# Patient Record
Sex: Male | Born: 1935
Health system: Southern US, Community
[De-identification: ages and names within clinical notes are randomized; demographics above are authoritative.]

## PROBLEM LIST (undated history)

## (undated) DIAGNOSIS — Z972 Presence of dental prosthetic device (complete) (partial): Secondary | ICD-10-CM

## (undated) DIAGNOSIS — I509 Heart failure, unspecified: Secondary | ICD-10-CM

## (undated) DIAGNOSIS — N390 Urinary tract infection, site not specified: Secondary | ICD-10-CM

## (undated) DIAGNOSIS — R413 Other amnesia: Secondary | ICD-10-CM

## (undated) DIAGNOSIS — C679 Malignant neoplasm of bladder, unspecified: Secondary | ICD-10-CM

## (undated) DIAGNOSIS — I639 Cerebral infarction, unspecified: Secondary | ICD-10-CM

## (undated) DIAGNOSIS — Z8489 Family history of other specified conditions: Secondary | ICD-10-CM

## (undated) DIAGNOSIS — E119 Type 2 diabetes mellitus without complications: Secondary | ICD-10-CM

## (undated) DIAGNOSIS — F32A Depression, unspecified: Secondary | ICD-10-CM

## (undated) DIAGNOSIS — K08109 Complete loss of teeth, unspecified cause, unspecified class: Secondary | ICD-10-CM

## (undated) DIAGNOSIS — E785 Hyperlipidemia, unspecified: Secondary | ICD-10-CM

## (undated) DIAGNOSIS — I739 Peripheral vascular disease, unspecified: Secondary | ICD-10-CM

## (undated) DIAGNOSIS — K219 Gastro-esophageal reflux disease without esophagitis: Secondary | ICD-10-CM

## (undated) DIAGNOSIS — M1990 Unspecified osteoarthritis, unspecified site: Secondary | ICD-10-CM

## (undated) DIAGNOSIS — N189 Chronic kidney disease, unspecified: Secondary | ICD-10-CM

## (undated) DIAGNOSIS — F329 Major depressive disorder, single episode, unspecified: Secondary | ICD-10-CM

## (undated) DIAGNOSIS — H9190 Unspecified hearing loss, unspecified ear: Secondary | ICD-10-CM

## (undated) DIAGNOSIS — F419 Anxiety disorder, unspecified: Secondary | ICD-10-CM

## (undated) DIAGNOSIS — I10 Essential (primary) hypertension: Secondary | ICD-10-CM

## (undated) HISTORY — DX: Cerebral infarction, unspecified: I63.9

## (undated) HISTORY — PX: HERNIA REPAIR: SHX51

## (undated) HISTORY — PX: COLONOSCOPY: SHX174

## (undated) HISTORY — PX: MULTIPLE TOOTH EXTRACTIONS: SHX2053

## (undated) HISTORY — PX: CATARACT EXTRACTION: SUR2

## (undated) HISTORY — PX: TONSILLECTOMY: SUR1361

## (undated) HISTORY — PX: CARPAL TUNNEL RELEASE: SHX101

## (undated) HISTORY — PX: CHOLECYSTECTOMY: SHX55

## (undated) HISTORY — PX: APPENDECTOMY: SHX54

---
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
## 1999-02-12 ENCOUNTER — Ambulatory Visit (HOSPITAL_COMMUNITY): Admission: RE | Admit: 1999-02-12 | Discharge: 1999-02-12 | Payer: Self-pay | Admitting: Family Medicine

## 1999-02-12 ENCOUNTER — Encounter: Payer: Self-pay | Admitting: Family Medicine

## 1999-03-26 ENCOUNTER — Ambulatory Visit (HOSPITAL_BASED_OUTPATIENT_CLINIC_OR_DEPARTMENT_OTHER): Admission: RE | Admit: 1999-03-26 | Discharge: 1999-03-26 | Payer: Self-pay | Admitting: Plastic Surgery

## 1999-06-28 ENCOUNTER — Encounter: Payer: Self-pay | Admitting: Family Medicine

## 1999-06-28 ENCOUNTER — Ambulatory Visit (HOSPITAL_COMMUNITY): Admission: RE | Admit: 1999-06-28 | Discharge: 1999-06-28 | Payer: Self-pay | Admitting: Family Medicine

## 2000-11-18 ENCOUNTER — Encounter: Admission: RE | Admit: 2000-11-18 | Discharge: 2001-02-16 | Payer: Self-pay | Admitting: Endocrinology

## 2007-10-13 ENCOUNTER — Ambulatory Visit: Admission: RE | Admit: 2007-10-13 | Discharge: 2007-10-13 | Payer: Self-pay | Admitting: Family Medicine

## 2007-10-13 ENCOUNTER — Encounter (INDEPENDENT_AMBULATORY_CARE_PROVIDER_SITE_OTHER): Payer: Self-pay | Admitting: Family Medicine

## 2007-10-13 ENCOUNTER — Ambulatory Visit: Payer: Self-pay | Admitting: Vascular Surgery

## 2010-07-05 ENCOUNTER — Ambulatory Visit (HOSPITAL_COMMUNITY): Admission: RE | Admit: 2010-07-05 | Discharge: 2010-07-05 | Payer: Self-pay | Admitting: Urology

## 2010-07-30 ENCOUNTER — Emergency Department (HOSPITAL_COMMUNITY): Admission: EM | Admit: 2010-07-30 | Discharge: 2010-07-30 | Payer: Self-pay | Admitting: Emergency Medicine

## 2010-08-23 ENCOUNTER — Ambulatory Visit (HOSPITAL_COMMUNITY): Admission: RE | Admit: 2010-08-23 | Discharge: 2010-08-23 | Payer: Self-pay | Admitting: Urology

## 2011-03-06 LAB — GLUCOSE, CAPILLARY
Glucose-Capillary: 142 mg/dL — ABNORMAL HIGH (ref 70–99)
Glucose-Capillary: 170 mg/dL — ABNORMAL HIGH (ref 70–99)
Glucose-Capillary: 206 mg/dL — ABNORMAL HIGH (ref 70–99)

## 2011-03-07 LAB — COMPREHENSIVE METABOLIC PANEL
ALT: 39 U/L (ref 0–53)
ALT: 33 U/L (ref 0–53)
AST: 53 U/L — ABNORMAL HIGH (ref 0–37)
AST: 41 U/L — ABNORMAL HIGH (ref 0–37)
Albumin: 3.7 g/dL (ref 3.5–5.2)
Albumin: 3.7 g/dL (ref 3.5–5.2)
Alkaline Phosphatase: 69 U/L (ref 39–117)
Alkaline Phosphatase: 66 U/L (ref 39–117)
BUN: 22 mg/dL (ref 6–23)
BUN: 23 mg/dL (ref 6–23)
CO2: 24 mEq/L (ref 19–32)
CO2: 26 mEq/L (ref 19–32)
Calcium: 9.2 mg/dL (ref 8.4–10.5)
Calcium: 10.1 mg/dL (ref 8.4–10.5)
Chloride: 107 mEq/L (ref 96–112)
Chloride: 105 mEq/L (ref 96–112)
Creatinine, Ser: 1.23 mg/dL (ref 0.4–1.5)
Creatinine, Ser: 1.24 mg/dL (ref 0.4–1.5)
GFR calc Af Amer: >60 mL/min (ref >60)
GFR calc Af Amer: >60 mL/min (ref >60)
GFR calc non Af Amer: 56 mL/min — ABNORMAL LOW (ref >60)
GFR calc non Af Amer: 56 mL/min — ABNORMAL LOW (ref >60)
Glucose, Bld: 271 mg/dL — ABNORMAL HIGH (ref 70–99)
Glucose, Bld: 172 mg/dL — ABNORMAL HIGH (ref 70–99)
Potassium: 4.2 mEq/L (ref 3.5–5.1)
Potassium: 4 mEq/L (ref 3.5–5.1)
Sodium: 141 mEq/L (ref 135–145)
Sodium: 139 mEq/L (ref 135–145)
Total Bilirubin: 0.7 mg/dL (ref 0.3–1.2)
Total Bilirubin: 0.6 mg/dL (ref 0.3–1.2)
Total Protein: 7 g/dL (ref 6.0–8.3)
Total Protein: 6.6 g/dL (ref 6.0–8.3)

## 2011-03-07 LAB — CBC
HCT: 39 % (ref 39.0–52.0)
HCT: 38.7 % — ABNORMAL LOW (ref 39.0–52.0)
Hemoglobin: 13.2 g/dL (ref 13.0–17.0)
Hemoglobin: 13.3 g/dL (ref 13.0–17.0)
MCH: 31 pg (ref 26.0–34.0)
MCH: 31.1 pg (ref 26.0–34.0)
MCHC: 33.8 g/dL (ref 30.0–36.0)
MCHC: 34.3 g/dL (ref 30.0–36.0)
MCV: 91.8 fL (ref 78.0–100.0)
MCV: 90.7 fL (ref 78.0–100.0)
Platelets: 180 10*3/uL (ref 150–400)
Platelets: 202 10*3/uL (ref 150–400)
RBC: 4.25 MIL/uL (ref 4.22–5.81)
RBC: 4.27 MIL/uL (ref 4.22–5.81)
RDW: 14 % (ref 11.5–15.5)
RDW: 14.1 % (ref 11.5–15.5)
WBC: 5.4 10*3/uL (ref 4.0–10.5)
WBC: 5.9 10*3/uL (ref 4.0–10.5)

## 2011-03-07 LAB — SURGICAL PCR SCREEN
MRSA, PCR: NEGATIVE
Staphylococcus aureus: NEGATIVE

## 2011-03-08 LAB — GLUCOSE, CAPILLARY
Glucose-Capillary: 124 mg/dL — ABNORMAL HIGH (ref 70–99)
Glucose-Capillary: 163 mg/dL — ABNORMAL HIGH (ref 70–99)

## 2011-03-09 LAB — SURGICAL PCR SCREEN
MRSA, PCR: NEGATIVE
Staphylococcus aureus: NEGATIVE

## 2011-03-09 LAB — COMPREHENSIVE METABOLIC PANEL
ALT: 39 U/L (ref 0–53)
AST: 46 U/L — ABNORMAL HIGH (ref 0–37)
Albumin: 4.1 g/dL (ref 3.5–5.2)
Alkaline Phosphatase: 62 U/L (ref 39–117)
BUN: 23 mg/dL (ref 6–23)
CO2: 28 mEq/L (ref 19–32)
Calcium: 10 mg/dL (ref 8.4–10.5)
Chloride: 104 mEq/L (ref 96–112)
Creatinine, Ser: 1.18 mg/dL (ref 0.4–1.5)
GFR calc Af Amer: >60 mL/min (ref >60)
GFR calc non Af Amer: 59 mL/min — ABNORMAL LOW (ref >60)
Glucose, Bld: 132 mg/dL — ABNORMAL HIGH (ref 70–99)
Potassium: 4.3 mEq/L (ref 3.5–5.1)
Sodium: 140 mEq/L (ref 135–145)
Total Bilirubin: 0.9 mg/dL (ref 0.3–1.2)
Total Protein: 7.2 g/dL (ref 6.0–8.3)

## 2011-03-09 LAB — CBC
HCT: 41.7 % (ref 39.0–52.0)
Hemoglobin: 14.4 g/dL (ref 13.0–17.0)
MCH: 31.6 pg (ref 26.0–34.0)
MCHC: 34.5 g/dL (ref 30.0–36.0)
MCV: 91.4 fL (ref 78.0–100.0)
Platelets: 215 10*3/uL (ref 150–400)
RBC: 4.57 MIL/uL (ref 4.22–5.81)
RDW: 14 % (ref 11.5–15.5)
WBC: 5.9 10*3/uL (ref 4.0–10.5)

## 2015-05-26 ENCOUNTER — Emergency Department (HOSPITAL_COMMUNITY): Payer: Medicare Other

## 2015-05-26 ENCOUNTER — Encounter (HOSPITAL_COMMUNITY): Payer: Self-pay | Admitting: Emergency Medicine

## 2015-05-26 ENCOUNTER — Inpatient Hospital Stay (HOSPITAL_COMMUNITY)
Admission: EM | Admit: 2015-05-26 | Discharge: 2015-05-28 | DRG: 065 | Disposition: A | Discharge status: Home Health | Payer: Medicare Other | Attending: Internal Medicine | Admitting: Internal Medicine

## 2015-05-26 DIAGNOSIS — Z8551 Personal history of malignant neoplasm of bladder: Secondary | ICD-10-CM

## 2015-05-26 DIAGNOSIS — E785 Hyperlipidemia, unspecified: Secondary | ICD-10-CM | POA: Diagnosis not present

## 2015-05-26 DIAGNOSIS — Z87891 Personal history of nicotine dependence: Secondary | ICD-10-CM

## 2015-05-26 DIAGNOSIS — N179 Acute kidney failure, unspecified: Secondary | ICD-10-CM | POA: Diagnosis not present

## 2015-05-26 DIAGNOSIS — E119 Type 2 diabetes mellitus without complications: Secondary | ICD-10-CM

## 2015-05-26 DIAGNOSIS — Z8249 Family history of ischemic heart disease and other diseases of the circulatory system: Secondary | ICD-10-CM

## 2015-05-26 DIAGNOSIS — I639 Cerebral infarction, unspecified: Principal | ICD-10-CM | POA: Diagnosis present

## 2015-05-26 DIAGNOSIS — Z823 Family history of stroke: Secondary | ICD-10-CM | POA: Diagnosis not present

## 2015-05-26 DIAGNOSIS — I10 Essential (primary) hypertension: Secondary | ICD-10-CM | POA: Diagnosis not present

## 2015-05-26 DIAGNOSIS — L97909 Non-pressure chronic ulcer of unspecified part of unspecified lower leg with unspecified severity: Secondary | ICD-10-CM | POA: Diagnosis not present

## 2015-05-26 DIAGNOSIS — E1159 Type 2 diabetes mellitus with other circulatory complications: Secondary | ICD-10-CM | POA: Diagnosis not present

## 2015-05-26 DIAGNOSIS — R531 Weakness: Secondary | ICD-10-CM | POA: Diagnosis not present

## 2015-05-26 DIAGNOSIS — E118 Type 2 diabetes mellitus with unspecified complications: Secondary | ICD-10-CM | POA: Diagnosis not present

## 2015-05-26 DIAGNOSIS — I351 Nonrheumatic aortic (valve) insufficiency: Secondary | ICD-10-CM | POA: Diagnosis not present

## 2015-05-26 DIAGNOSIS — I6789 Other cerebrovascular disease: Secondary | ICD-10-CM | POA: Diagnosis not present

## 2015-05-26 DIAGNOSIS — I63412 Cerebral infarction due to embolism of left middle cerebral artery: Secondary | ICD-10-CM | POA: Diagnosis not present

## 2015-05-26 HISTORY — DX: Essential (primary) hypertension: I10

## 2015-05-26 HISTORY — DX: Type 2 diabetes mellitus without complications: E11.9

## 2015-05-26 HISTORY — DX: Urinary tract infection, site not specified: N39.0

## 2015-05-26 HISTORY — DX: Malignant neoplasm of bladder, unspecified: C67.9

## 2015-05-26 HISTORY — DX: Hyperlipidemia, unspecified: E78.5

## 2015-05-26 LAB — COMPREHENSIVE METABOLIC PANEL
ALT: 30 U/L (ref 17–63)
AST: 39 U/L (ref 15–41)
Albumin: 3.5 g/dL (ref 3.5–5.0)
Alkaline Phosphatase: 63 U/L (ref 38–126)
Anion gap: 14 (ref 5–15)
BUN: 28 mg/dL — ABNORMAL HIGH (ref 6–20)
CO2: 25 mmol/L (ref 22–32)
Calcium: 9.5 mg/dL (ref 8.9–10.3)
Chloride: 100 mmol/L — ABNORMAL LOW (ref 101–111)
Creatinine, Ser: 1.66 mg/dL — ABNORMAL HIGH (ref 0.61–1.24)
GFR calc Af Amer: 42 mL/min — ABNORMAL LOW (ref >60)
GFR calc non Af Amer: 36 mL/min — ABNORMAL LOW (ref >60)
Glucose, Bld: 258 mg/dL — ABNORMAL HIGH (ref 65–99)
Potassium: 4.1 mmol/L (ref 3.5–5.1)
Sodium: 139 mmol/L (ref 135–145)
Total Bilirubin: 0.5 mg/dL (ref 0.3–1.2)
Total Protein: 6.2 g/dL — ABNORMAL LOW (ref 6.5–8.1)

## 2015-05-26 LAB — DIFFERENTIAL
Basophils Absolute: 0 10*3/uL (ref 0.0–0.1)
Basophils Relative: 0 % (ref 0–1)
Eosinophils Absolute: 0.3 10*3/uL (ref 0.0–0.7)
Eosinophils Relative: 4 % (ref 0–5)
Lymphocytes Relative: 24 % (ref 12–46)
Lymphs Abs: 1.7 10*3/uL (ref 0.7–4.0)
Monocytes Absolute: 0.8 10*3/uL (ref 0.1–1.0)
Monocytes Relative: 11 % (ref 3–12)
Neutro Abs: 4.4 10*3/uL (ref 1.7–7.7)
Neutrophils Relative %: 61 % (ref 43–77)

## 2015-05-26 LAB — ETHANOL: Alcohol, Ethyl (B): <5 mg/dL (ref <5)

## 2015-05-26 LAB — CBC
HCT: 39.2 % (ref 39.0–52.0)
Hemoglobin: 13.3 g/dL (ref 13.0–17.0)
MCH: 30.6 pg (ref 26.0–34.0)
MCHC: 33.9 g/dL (ref 30.0–36.0)
MCV: 90.3 fL (ref 78.0–100.0)
Platelets: 186 10*3/uL (ref 150–400)
RBC: 4.34 MIL/uL (ref 4.22–5.81)
RDW: 13.3 % (ref 11.5–15.5)
WBC: 7.2 10*3/uL (ref 4.0–10.5)

## 2015-05-26 LAB — I-STAT CHEM 8, ED
BUN: 31 mg/dL — ABNORMAL HIGH (ref 6–20)
Calcium, Ion: 1.18 mmol/L (ref 1.13–1.30)
Chloride: 102 mmol/L (ref 101–111)
Creatinine, Ser: 1.6 mg/dL — ABNORMAL HIGH (ref 0.61–1.24)
Glucose, Bld: 264 mg/dL — ABNORMAL HIGH (ref 65–99)
HCT: 40 % (ref 39.0–52.0)
Hemoglobin: 13.6 g/dL (ref 13.0–17.0)
Potassium: 4.2 mmol/L (ref 3.5–5.1)
Sodium: 139 mmol/L (ref 135–145)
TCO2: 24 mmol/L (ref 0–100)

## 2015-05-26 LAB — PROTIME-INR
INR: 1.14 (ref 0.00–1.49)
Prothrombin Time: 14.8 seconds (ref 11.6–15.2)

## 2015-05-26 LAB — I-STAT TROPONIN, ED: Troponin i, poc: 0.01 ng/mL (ref 0.00–0.08)

## 2015-05-26 LAB — APTT: aPTT: 27 seconds (ref 24–37)

## 2015-05-26 NOTE — ED Notes (Signed)
Per ems, pt was outside washing with pressure washer, felt like his right arm and leg felt "funny". Fine motor skills not working well. Pt also c/o slurred speech.

## 2015-05-26 NOTE — ED Notes (Signed)
Airway cleared by Dr. Wyvonnia Dusky. Pt transported to CT 3 by this RN

## 2015-05-26 NOTE — ED Notes (Signed)
Pt returned back from CT.

## 2015-05-26 NOTE — H&P (Addendum)
Henry Wiggins is an 81 y.o. male.   Dr. Rockwell Germany (pcp)  Chief Complaint: right hand weakness HPI: 81 yo male with dm2, htn, hyperlipidemia,  Was out powerwashing and about 3:30 walking up on porch, had a strange feeling in the left leg.  It was not responding to what he wanted it to do.  The sensation lasted for about 10 minutes.  Pt wife went to get aspirin.  And then he ate dinner. And then went to turn television on to watch [employer].  Pt had difficulty speaking, didn't slurr words but difficult to get out.  Went to pick up a glass but hand would not work right.  Pt 's family call ambulance and pt came to ED for evaluation.  CT brain showed bilateral lacunar infarcts new from 2011.  EKG showed nsr at 80.  Pt notes that bp has been high for ? Years.  ED consulted neurology for code stroke.  Pt will be admitted for cva.   Past Medical History  Diagnosis Date  . Diabetes   . Hypertension   . Hyperlipidemia   . UTI (lower urinary tract infection)   . Bladder cancer     Dr. Janice Norrie    Past Surgical History  Procedure Laterality Date  . Cholecystectomy    . Hernia repair      x4  . Carpal tunnel release Right   . Appendectomy    . Cataract extraction Bilateral   . Tonsillectomy      Family History  Problem Relation Age of Onset  . Hypertension Mother    Social History:  reports that he quit smoking about 61 years ago. His smoking use included Cigarettes. He has a 2.5 pack-year smoking history. He does not have any smokeless tobacco history on file. He reports that he does not drink alcohol. His drug history is not on file.  Allergies: No Known Allergies Medications reviewed  Results for orders placed or performed during the hospital encounter of 05/26/15 (from the past 48 hour(s))  Ethanol     Status: None   Collection Time: 05/26/15 10:44 PM  Result Value Ref Range   Alcohol, Ethyl (B) <5 <5 mg/dL    Comment:        LOWEST DETECTABLE LIMIT FOR SERUM ALCOHOL IS 11 mg/dL FOR  MEDICAL PURPOSES ONLY   Protime-INR     Status: None   Collection Time: 05/26/15 10:44 PM  Result Value Ref Range   Prothrombin Time 14.8 11.6 - 15.2 seconds   INR 1.14 0.00 - 1.49  APTT     Status: None   Collection Time: 05/26/15 10:44 PM  Result Value Ref Range   aPTT 27 24 - 37 seconds  CBC     Status: None   Collection Time: 05/26/15 10:44 PM  Result Value Ref Range   WBC 7.2 4.0 - 10.5 K/uL   RBC 4.34 4.22 - 5.81 MIL/uL   Hemoglobin 13.3 13.0 - 17.0 g/dL   HCT 39.2 39.0 - 52.0 %   MCV 90.3 78.0 - 100.0 fL   MCH 30.6 26.0 - 34.0 pg   MCHC 33.9 30.0 - 36.0 g/dL   RDW 13.3 11.5 - 15.5 %   Platelets 186 150 - 400 K/uL  Differential     Status: None   Collection Time: 05/26/15 10:44 PM  Result Value Ref Range   Neutrophils Relative % 61 43 - 77 %   Neutro Abs 4.4 1.7 - 7.7 K/uL   Lymphocytes  Relative 24 12 - 46 %   Lymphs Abs 1.7 0.7 - 4.0 K/uL   Monocytes Relative 11 3 - 12 %   Monocytes Absolute 0.8 0.1 - 1.0 K/uL   Eosinophils Relative 4 0 - 5 %   Eosinophils Absolute 0.3 0.0 - 0.7 K/uL   Basophils Relative 0 0 - 1 %   Basophils Absolute 0.0 0.0 - 0.1 K/uL  Comprehensive metabolic panel     Status: Abnormal   Collection Time: 05/26/15 10:44 PM  Result Value Ref Range   Sodium 139 135 - 145 mmol/L   Potassium 4.1 3.5 - 5.1 mmol/L   Chloride 100 (L) 101 - 111 mmol/L   CO2 25 22 - 32 mmol/L   Glucose, Bld 258 (H) 65 - 99 mg/dL   BUN 28 (H) 6 - 20 mg/dL   Creatinine, Ser 1.66 (H) 0.61 - 1.24 mg/dL   Calcium 9.5 8.9 - 10.3 mg/dL   Total Protein 6.2 (L) 6.5 - 8.1 g/dL   Albumin 3.5 3.5 - 5.0 g/dL   AST 39 15 - 41 U/L   ALT 30 17 - 63 U/L   Alkaline Phosphatase 63 38 - 126 U/L   Total Bilirubin 0.5 0.3 - 1.2 mg/dL   GFR calc non Af Amer 36 (L) >60 mL/min   GFR calc Af Amer 42 (L) >60 mL/min    Comment: (NOTE) The eGFR has been calculated using the CKD EPI equation. This calculation has not been validated in all clinical situations. eGFR's persistently <60 mL/min  signify possible Chronic Kidney Disease.    Anion gap 14 5 - 15  I-stat troponin, ED (not at [facility], [facility])     Status: None   Collection Time: 05/26/15 11:00 PM  Result Value Ref Range   Troponin i, poc 0.01 0.00 - 0.08 ng/mL   Comment 3            Comment: Due to the release kinetics of cTnI, a negative result within the first hours of the onset of symptoms does not rule out myocardial infarction with certainty. If myocardial infarction is still suspected, repeat the test at appropriate intervals.   I-Stat Chem 8, ED  (not at [facility], [facility])     Status: Abnormal   Collection Time: 05/26/15 11:01 PM  Result Value Ref Range   Sodium 139 135 - 145 mmol/L   Potassium 4.2 3.5 - 5.1 mmol/L   Chloride 102 101 - 111 mmol/L   BUN 31 (H) 6 - 20 mg/dL   Creatinine, Ser 1.60 (H) 0.61 - 1.24 mg/dL   Glucose, Bld 264 (H) 65 - 99 mg/dL   Calcium, Ion 1.18 1.13 - 1.30 mmol/L   TCO2 24 0 - 100 mmol/L   Hemoglobin 13.6 13.0 - 17.0 g/dL   HCT 40.0 39.0 - 52.0 %   Ct Head Wo Contrast  05/26/2015   CLINICAL DATA:  Code stroke. Right upper extremity weakness and slurred speech. Initial encounter.  EXAM: CT HEAD WITHOUT CONTRAST  TECHNIQUE: Contiguous axial images were obtained from the base of the skull through the vertex without intravenous contrast.  COMPARISON:  MRI of the brain performed 10/12/2007, and CT of the head performed 07/30/2010  FINDINGS: There is no evidence of acute infarction, mass lesion, or intra- or extra-axial hemorrhage on CT.  Prominence of the ventricles and sulci reflects mild to moderate cortical volume loss. Cerebellar atrophy is noted. Diffuse periventricular and subcortical white matter change likely reflects small vessel ischemic microangiopathy. Mild chronic  ischemic change is noted at the basal ganglia bilaterally. Scattered chronic lacunar infarcts are noted at the corona radiata bilaterally.  The brainstem and fourth ventricle are within normal limits. The cerebral hemispheres  demonstrate grossly normal gray-white differentiation. No mass effect or midline shift is seen.  There is no evidence of fracture; visualized osseous structures are unremarkable in appearance. The orbits are within normal limits. The paranasal sinuses and mastoid air cells are well-aerated. No significant soft tissue abnormalities are seen.  IMPRESSION: 1. No acute intracranial pathology seen on CT. 2. Mild to moderate cortical volume loss and diffuse small vessel ischemic microangiopathy. 3. Scattered chronic lacunar infarcts at the corona radiata bilaterally, and chronic ischemic change at the basal ganglia.  These results were called by telephone at the time of interpretation on 05/26/2015 at 11:01 pm to Dr. Leonel Ramsay, who verbally acknowledged these results.   Electronically Signed   By: Garald Balding M.D.   On: 05/26/2015 23:01    Review of Systems  Constitutional: Negative.   HENT: Negative.   Eyes: Negative.   Respiratory: Negative.   Cardiovascular: Negative.   Gastrointestinal: Negative.   Genitourinary: Negative.   Musculoskeletal: Negative.   Skin: Negative.   Neurological: Positive for focal weakness. Negative for dizziness, tingling, tremors, sensory change and speech change.  Endo/Heme/Allergies: Negative.   Psychiatric/Behavioral: Negative.     Blood pressure 171/75, pulse 78, temperature 97.9 F (36.6 C), temperature source Oral, resp. rate 21, SpO2 94 %. Physical Exam  Constitutional: He is oriented to person, place, and time. He appears well-developed and well-nourished.  HENT:  Head: Normocephalic and atraumatic.  Eyes: Conjunctivae and EOM are normal. Pupils are equal, round, and reactive to light. No scleral icterus.  Neck: Normal range of motion. Neck supple. No JVD present. No tracheal deviation present. No thyromegaly present.  Cardiovascular: Normal rate and regular rhythm.  Exam reveals no gallop and no friction rub.   No murmur heard. Respiratory: Effort normal  and breath sounds normal. No respiratory distress. He has no wheezes. He has no rales.  GI: Soft. Bowel sounds are normal. He exhibits no distension. There is no tenderness. There is no rebound and no guarding.  Musculoskeletal: Normal range of motion. He exhibits no edema or tenderness.  Lymphadenopathy:    He has no cervical adenopathy.  Neurological: He is alert and oriented to person, place, and time. He has normal reflexes. He displays normal reflexes. No cranial nerve deficit. He exhibits normal muscle tone. Coordination normal.  Skin: Skin is warm and dry. No rash noted. No erythema. No pallor.  Psychiatric: He has a normal mood and affect. His behavior is normal. Judgment and thought content normal.     Assessment/Plan CVA Check MRI brain Check carotid ultrasound Check echo  Check lipid, hga1c, tsh, b12, esr Start on aspirin,  Pt has had adverse reaction to statins in the past=> confusion, (zocor)  ARF Check urine sodium , urine creatinine, urineeosinphils Renal ultrasound Hydrate with normal saline.  Stop losartan/hydrochlorothiazide  Dm2 fsbs ac and qhs, iss Stop metformin due to ARF  Hypertension; Hydralazine 65m iv q6h prn sbp >160  DVT scd,   KJani Gravel6/03/2015, 11:47 PM

## 2015-05-26 NOTE — ED Provider Notes (Signed)
CSN: [id number]     Arrival date & time 05/26/15  2231 History   First MD Initiated Contact with Patient 05/26/15 2236     Chief Complaint  Patient presents with  . Code Stroke     (Consider location/radiation/quality/duration/timing/severity/associated sxs/prior Treatment) HPI Comments: Patient from home with slurred speech, difficulty using right arm and heaviness that onset about one hour ago. 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 He denies any soreness in the arm. He says he's had difficulty gripping a cup and difficulty using the arm. He also try to call his wife and his speech was slurred. Denies any headache, chest pain, abdominal pain, back pain, nausea or vomiting. Code stroke was not activated by EMS as symptoms were head reportedly resolved. On my exam patient has some dysarthria and weakness in his right arm with past pointing and code stroke was activated.  The history is provided by the EMS personnel and the patient.    Past Medical History  Diagnosis Date  . Diabetes   . Hypertension   . Hyperlipidemia   . UTI (lower urinary tract infection)   . Bladder cancer     Dr. Janice Norrie   Past Surgical History  Procedure Laterality Date  . Cholecystectomy    . Hernia repair      x4  . Carpal tunnel release Right   . Appendectomy    . Cataract extraction Bilateral   . Tonsillectomy     Family History  Problem Relation Age of Onset  . Hypertension Mother    History  Substance Use Topics  . Smoking status: Former Smoker -- 0.50 packs/day for 5 years    Types: Cigarettes    Quit date: 12/22/1953  . Smokeless tobacco: Not on file  . Alcohol Use: No    Review of Systems  Constitutional: Negative for activity change and appetite change.  HENT: Negative for hearing loss.   Respiratory: Negative for cough and shortness of breath.   Cardiovascular: Negative for chest pain.  Gastrointestinal: Negative for nausea, vomiting and abdominal pain.   Genitourinary: Negative for dysuria, urgency and hematuria.  Musculoskeletal: Negative for myalgias and arthralgias.  Skin: Negative for rash.  Neurological: Positive for speech difficulty, weakness and numbness. Negative for dizziness, facial asymmetry and headaches.  A complete 10 system review of systems was obtained and all systems are negative except as noted in the HPI and PMH.      Allergies  Review of patient's allergies indicates no known allergies.  Home Medications   Prior to Admission medications   Medication Sig Start Date End Date Taking? Authorizing Provider  aspirin 325 MG tablet Take 325 mg by mouth once.   Yes Historical Provider, MD  Cholecalciferol 1000 UNITS tablet Take 1,000 Units by mouth daily.   Yes Historical Provider, MD  losartan-hydrochlorothiazide (HYZAAR) 100-25 MG per tablet Take 1 tablet by mouth daily. 04/26/15  Yes Historical Provider, MD  metFORMIN (GLUCOPHAGE) 1000 MG tablet Take 1 tablet by mouth 2 (two) times daily. 04/26/15  Yes Historical Provider, MD  Multiple Vitamins-Minerals (EYE VITAMINS & MINERALS PO) Take 1 tablet by mouth daily.   Yes Historical Provider, MD  Multiple Vitamins-Minerals (ZINC PO) Take 1 tablet by mouth every other day.   Yes Historical Provider, MD  vitamin C (ASCORBIC ACID) 500 MG tablet Take 500 mg by mouth daily.   Yes Historical Provider, MD  vitamin E 400 UNIT capsule Take 400 Units by mouth daily.  Yes Historical Provider, MD   BP 187/68 mmHg  Pulse 76  Temp(Src) 97.9 F (36.6 C) (Oral)  Resp 18  Ht 5\' 9"  (1.753 m)  Wt 202 lb 13.2 oz (92 kg)  BMI 29.94 kg/m2  SpO2 95% Physical Exam  Constitutional: He is oriented to person, place, and time. He appears well-developed and well-nourished. No distress.  HENT:  Head: Normocephalic and atraumatic.  Mouth/Throat: Oropharynx is clear and moist. No oropharyngeal exudate.  Eyes: Conjunctivae and EOM are normal. Pupils are equal, round, and reactive to light.  Neck:  Normal range of motion. Neck supple.  No meningismus.  Cardiovascular: Normal rate, regular rhythm, normal heart sounds and intact distal pulses.   No murmur heard. Pulmonary/Chest: Effort normal and breath sounds normal. No respiratory distress.  Abdominal: Soft. There is no tenderness. There is no rebound and no guarding.  Musculoskeletal: Normal range of motion. He exhibits no edema or tenderness.  Neurological: He is alert and oriented to person, place, and time. No cranial nerve deficit. He exhibits normal muscle tone. Coordination normal.  Mild dysarthria. No facial droop. Cranial nerves II-12 intact. Grip Strength weakness on the right with pass pointing on finger to nose. Pronator drift on the right. 5/5 strength in bilateral lower extremities.  Skin: Skin is warm.  Psychiatric: He has a normal mood and affect. His behavior is normal.  Nursing note and vitals reviewed.   ED Course  Procedures (including critical care time) Labs Review Labs Reviewed  COMPREHENSIVE METABOLIC PANEL - Abnormal; Notable for the following:    Chloride 100 (*)    Glucose, Bld 258 (*)    BUN 28 (*)    Creatinine, Ser 1.66 (*)    Total Protein 6.2 (*)    GFR calc non Af Amer 36 (*)    GFR calc Af Amer 42 (*)    All other components within normal limits  I-STAT CHEM 8, ED - Abnormal; Notable for the following:    BUN 31 (*)    Creatinine, Ser 1.60 (*)    Glucose, Bld 264 (*)    All other components within normal limits  ETHANOL  PROTIME-INR  APTT  CBC  DIFFERENTIAL  URINE RAPID DRUG SCREEN (HOSP PERFORMED) NOT AT [facility]  URINALYSIS, ROUTINE W REFLEX MICROSCOPIC (NOT AT [facility])  HEMOGLOBIN A1C  LIPID PANEL  CBC  COMPREHENSIVE METABOLIC PANEL  TSH  SEDIMENTATION RATE  VITAMIN B12  ANTINUCLEAR ANTIBODIES, IFA  SODIUM, URINE, RANDOM  CALCIUM / CREATININE RATIO, URINE  I-STAT TROPOININ, ED  CYTOLOGY - NON PAP    Imaging Review Ct Head Wo Contrast  05/26/2015   CLINICAL DATA:  Code stroke.  Right upper extremity weakness and slurred speech. Initial encounter.  EXAM: CT HEAD WITHOUT CONTRAST  TECHNIQUE: Contiguous axial images were obtained from the base of the skull through the vertex without intravenous contrast.  COMPARISON:  MRI of the brain performed 10/12/2007, and CT of the head performed 07/30/2010  FINDINGS: There is no evidence of acute infarction, mass lesion, or intra- or extra-axial hemorrhage on CT.  Prominence of the ventricles and sulci reflects mild to moderate cortical volume loss. Cerebellar atrophy is noted. Diffuse periventricular and subcortical white matter change likely reflects small vessel ischemic microangiopathy. Mild chronic ischemic change is noted at the basal ganglia bilaterally. Scattered chronic lacunar infarcts are noted at the corona radiata bilaterally.  The brainstem and fourth ventricle are within normal limits. The cerebral hemispheres demonstrate grossly normal gray-white differentiation. No mass effect or  midline shift is seen.  There is no evidence of fracture; visualized osseous structures are unremarkable in appearance. The orbits are within normal limits. The paranasal sinuses and mastoid air cells are well-aerated. No significant soft tissue abnormalities are seen.  IMPRESSION: 1. No acute intracranial pathology seen on CT. 2. Mild to moderate cortical volume loss and diffuse small vessel ischemic microangiopathy. 3. Scattered chronic lacunar infarcts at the corona radiata bilaterally, and chronic ischemic change at the basal ganglia.  These results were called by telephone at the time of interpretation on 05/26/2015 at 11:01 pm to Dr. Leonel Ramsay, who verbally acknowledged these results.   Electronically Signed   By: Garald Balding M.D.   On: 05/26/2015 23:01     EKG Interpretation   Date/Time:  Saturday May 26 2015 22:54:39 EDT Ventricular Rate:  82 PR Interval:  160 QRS Duration: 93 QT Interval:  401 QTC Calculation: 468 R Axis:   -48 Text  Interpretation:  Sinus rhythm LAD, consider left anterior fascicular  block No significant change was found Confirmed by Wyvonnia Dusky  MD, Vyron Fronczak  [phone]) on 05/26/2015 11:05:20 PM      MDM   Final diagnoses:  Stroke   Code stroke on arrival. Seen with Dr. Leonel Ramsay. He agrees patient likely has had stroke. Patient declines TPA as his symptoms are minimal.  CT head negative. Dr. Leonel Ramsay d/w patient tPA which he declines as his deficits are minimal and he appears to be improving.   Plan admission for stroke workup.    Ezequiel Essex, MD 05/27/15 0120

## 2015-05-26 NOTE — ED Notes (Signed)
Too good to treat, symptoms resolving. q30 minutes neuro checks, q15 min vitals until 0100 on 05/27/15

## 2015-05-26 NOTE — Code Documentation (Signed)
Mr. Henry Wiggins presented to [facility] via [facility] for Rt side weakness.  He had spent the day powerwashing at his house and his LKW 2130.  On arrival he was having Rt hand weakness and Rt sided ataxia.  He was offered the Prism study and declined,  NIH 2.

## 2015-05-27 ENCOUNTER — Inpatient Hospital Stay (HOSPITAL_COMMUNITY): Payer: Medicare Other

## 2015-05-27 DIAGNOSIS — I6789 Other cerebrovascular disease: Secondary | ICD-10-CM

## 2015-05-27 DIAGNOSIS — E118 Type 2 diabetes mellitus with unspecified complications: Secondary | ICD-10-CM

## 2015-05-27 DIAGNOSIS — N179 Acute kidney failure, unspecified: Secondary | ICD-10-CM

## 2015-05-27 DIAGNOSIS — I10 Essential (primary) hypertension: Secondary | ICD-10-CM

## 2015-05-27 DIAGNOSIS — I639 Cerebral infarction, unspecified: Secondary | ICD-10-CM | POA: Diagnosis present

## 2015-05-27 DIAGNOSIS — L97909 Non-pressure chronic ulcer of unspecified part of unspecified lower leg with unspecified severity: Secondary | ICD-10-CM

## 2015-05-27 LAB — CBC
HCT: 40 % (ref 39.0–52.0)
Hemoglobin: 13.5 g/dL (ref 13.0–17.0)
MCH: 30.5 pg (ref 26.0–34.0)
MCHC: 33.8 g/dL (ref 30.0–36.0)
MCV: 90.5 fL (ref 78.0–100.0)
Platelets: 183 10*3/uL (ref 150–400)
RBC: 4.42 MIL/uL (ref 4.22–5.81)
RDW: 13.1 % (ref 11.5–15.5)
WBC: 6.3 10*3/uL (ref 4.0–10.5)

## 2015-05-27 LAB — LIPID PANEL
Cholesterol: 275 mg/dL — ABNORMAL HIGH (ref 0–200)
HDL: 40 mg/dL — ABNORMAL LOW (ref >40)
LDL Cholesterol: 177 mg/dL — ABNORMAL HIGH (ref 0–99)
Total CHOL/HDL Ratio: 6.9 RATIO
Triglycerides: 289 mg/dL — ABNORMAL HIGH (ref <150)
VLDL: 58 mg/dL — ABNORMAL HIGH (ref 0–40)

## 2015-05-27 LAB — URINALYSIS, ROUTINE W REFLEX MICROSCOPIC
Bilirubin Urine: NEGATIVE
Glucose, UA: 500 mg/dL — AB
Hgb urine dipstick: NEGATIVE
Ketones, ur: 15 mg/dL — AB
Leukocytes, UA: NEGATIVE
Nitrite: NEGATIVE
Protein, ur: NEGATIVE mg/dL
Specific Gravity, Urine: 1.021 (ref 1.005–1.030)
Urobilinogen, UA: 0.2 mg/dL (ref 0.0–1.0)
pH: 6 (ref 5.0–8.0)

## 2015-05-27 LAB — COMPREHENSIVE METABOLIC PANEL
ALT: 28 U/L (ref 17–63)
AST: 33 U/L (ref 15–41)
Albumin: 3.6 g/dL (ref 3.5–5.0)
Alkaline Phosphatase: 69 U/L (ref 38–126)
Anion gap: 10 (ref 5–15)
BUN: 22 mg/dL — ABNORMAL HIGH (ref 6–20)
CO2: 26 mmol/L (ref 22–32)
Calcium: 9.3 mg/dL (ref 8.9–10.3)
Chloride: 99 mmol/L — ABNORMAL LOW (ref 101–111)
Creatinine, Ser: 1.14 mg/dL (ref 0.61–1.24)
GFR calc Af Amer: >60 mL/min (ref >60)
GFR calc non Af Amer: 57 mL/min — ABNORMAL LOW (ref >60)
Glucose, Bld: 273 mg/dL — ABNORMAL HIGH (ref 65–99)
Potassium: 4.1 mmol/L (ref 3.5–5.1)
Sodium: 135 mmol/L (ref 135–145)
Total Bilirubin: 0.7 mg/dL (ref 0.3–1.2)
Total Protein: 6.6 g/dL (ref 6.5–8.1)

## 2015-05-27 LAB — GLUCOSE, CAPILLARY
Glucose-Capillary: 220 mg/dL — ABNORMAL HIGH (ref 65–99)
Glucose-Capillary: 286 mg/dL — ABNORMAL HIGH (ref 65–99)
Glucose-Capillary: 251 mg/dL — ABNORMAL HIGH (ref 65–99)
Glucose-Capillary: 224 mg/dL — ABNORMAL HIGH (ref 65–99)

## 2015-05-27 LAB — VITAMIN B12: Vitamin B-12: 519 pg/mL (ref 180–914)

## 2015-05-27 LAB — RAPID URINE DRUG SCREEN, HOSP PERFORMED
Amphetamines: NOT DETECTED
Barbiturates: NOT DETECTED
Benzodiazepines: NOT DETECTED
Cocaine: NOT DETECTED
Opiates: NOT DETECTED
Tetrahydrocannabinol: NOT DETECTED

## 2015-05-27 LAB — TSH: TSH: 2.518 u[IU]/mL (ref 0.350–4.500)

## 2015-05-27 LAB — SEDIMENTATION RATE: Sed Rate: 20 mm/hr — ABNORMAL HIGH (ref 0–16)

## 2015-05-27 LAB — SODIUM, URINE, RANDOM: Sodium, Ur: 133 mmol/L

## 2015-05-27 MED ORDER — INSULIN ASPART 100 UNIT/ML ~~LOC~~ SOLN
0.0000 [IU] | Freq: Every day | SUBCUTANEOUS | Status: DC
  Administered 2015-05-27: 2 [IU] via SUBCUTANEOUS

## 2015-05-27 MED ORDER — STROKE: EARLY STAGES OF RECOVERY BOOK
Freq: Once | Status: AC
  Administered 2015-05-27: 01:00:00

## 2015-05-27 MED ORDER — HYDRALAZINE HCL 20 MG/ML IJ SOLN
5.0000 mg | Freq: Four times a day (QID) | INTRAMUSCULAR | Status: DC | PRN
  Administered 2015-05-27 – 2015-05-28 (×3): 5 mg via INTRAVENOUS
  Filled 2015-05-27 (×4): qty 1

## 2015-05-27 MED ORDER — ASPIRIN 325 MG PO TABS
325.0000 mg | ORAL_TABLET | Freq: Once | ORAL | Status: AC
  Administered 2015-05-27: 325 mg via ORAL
  Filled 2015-05-27: qty 1

## 2015-05-27 MED ORDER — ATORVASTATIN CALCIUM 40 MG PO TABS: 40.0000 mg | ORAL_TABLET | Freq: Every day | ORAL | Status: DC

## 2015-05-27 MED ORDER — INSULIN ASPART 100 UNIT/ML ~~LOC~~ SOLN
0.0000 [IU] | Freq: Three times a day (TID) | SUBCUTANEOUS | Status: DC
  Administered 2015-05-27: 5 [IU] via SUBCUTANEOUS
  Administered 2015-05-27: 3 [IU] via SUBCUTANEOUS
  Administered 2015-05-27: 5 [IU] via SUBCUTANEOUS
  Administered 2015-05-28 (×2): 2 [IU] via SUBCUTANEOUS

## 2015-05-27 MED ORDER — SODIUM CHLORIDE 0.9 % IV SOLN
INTRAVENOUS | Status: AC
  Administered 2015-05-27: 03:00:00 via INTRAVENOUS

## 2015-05-27 MED ORDER — ASPIRIN 325 MG PO TABS
325.0000 mg | ORAL_TABLET | Freq: Every day | ORAL | Status: DC
  Administered 2015-05-27 – 2015-05-28 (×2): 325 mg via ORAL
  Filled 2015-05-27 (×2): qty 1

## 2015-05-27 NOTE — Progress Notes (Signed)
VASCULAR LAB PRELIMINARY  PRELIMINARY  PRELIMINARY  PRELIMINARY  Carotid Dopplers completed.    Preliminary report:  There is 1-39% right ICA stenosis, low end of scale.  There is 1-39% left ICA stenosis, highest end of scale. Vertebral artery flow is antegrade.   Rosmary Dionisio, RVT 05/27/2015, 4:14 PM

## 2015-05-27 NOTE — Progress Notes (Signed)
Patient off unit at this time.

## 2015-05-27 NOTE — Progress Notes (Signed)
Nurse called to say family reports patient is allergic to Lipitor. Apparently it causes patient to hallucinate. Will D/C for now until clarified,  Lowry Ram [facility] Pager [phone] 05/27/2015, 4:32 PM

## 2015-05-27 NOTE — Progress Notes (Signed)
VASCULAR LAB PRELIMINARY  PRELIMINARY  PRELIMINARY  PRELIMINARY  Bilateral lower extremity venous Dopplers completed.    Preliminary report:  There is no DVT or SVT noted in the bilateral lower extremities.   Hansika Leaming, RVT 05/27/2015, 4:31 PM

## 2015-05-27 NOTE — Consult Note (Signed)
Neurology Consultation Reason for Consult: Right-sided weakness Referring Physician: Rancour, S  CC: Arm weakness  History is obtained from: Patient  HPI: Henry Wiggins is a 81 y.o. 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 He also initially endorsed difficulty speaking which was not slurred speech but rather difficulty with getting words out. His speech improved by the time of his arrival to the emergency room, but he continues to have right arm weakness.   LKW: 9:30 PM tpa given?: no, mild symptoms    ROS: A 14 point ROS was performed and is negative except as noted in the HPI.   Past Medical History  Diagnosis Date  . Diabetes   . Hypertension   . Hyperlipidemia   . UTI (lower urinary tract infection)   . Bladder cancer     Dr. Janice Norrie    Family History: Mother-hypertension, stroke  Social History: Tob: Denies  Exam: Current vital signs: BP 189/84 mmHg  Pulse 77  Temp(Src) 97.9 F (36.6 C) (Oral)  Resp 25  SpO2 94% Vital signs in last 24 hours: Temp:  [97.9 F (36.6 C)] 97.9 F (36.6 C) (06/04 2309) Pulse Rate:  [74-80] 77 (06/04 2345) Resp:  [16-25] 25 (06/04 2345) BP: (171-189)/(75-91) 189/84 mmHg (06/04 2345) SpO2:  [94 %-96 %] 94 % (06/04 2345)   Physical Exam  Constitutional: Appears well-developed and well-nourished.  Psych: Affect appropriate to situation Eyes: No scleral injection HENT: No OP obstrucion Head: Normocephalic.  Cardiovascular: Normal rate and regular rhythm.  Respiratory: Effort normal and breath sounds normal to anterior ascultation GI: Soft.  No distension. There is no tenderness.  Skin: WDI  Neuro: Mental Status: Patient is awake, alert, oriented to person, place, month, year, and situation. Patient is able to give a clear and coherent history. No signs of aphasia or neglect Cranial Nerves: II: Visual Fields are full. Pupils  are equal, round, and reactive to light.   III,IV, VI: EOMI without ptosis or diploplia.  V: Facial sensation is symmetric to temperature VII: Facial movement is symmetric.  VIII: hearing is intact to voice X: Uvula elevates symmetrically XI: Shoulder shrug is symmetric. XII: tongue is midline without atrophy or fasciculations.  Motor: Tone is normal. Bulk is normal. 5/5 strength was present in the right leg and left arm and leg. He has good proximal strength in the right arm with no drift, however he does have mild weakness of the distal extremity. Sensory: Sensation is symmetric to light touch and temperature in the arms and legs. Cerebellar: FNF and HKS are intact on the left, impaired on the right   I have reviewed labs in epic and the results pertinent to this consultation are: Mildly elevated creatinine Elevated glucose  I have reviewed the images obtained: CT head-unremarkable  Impression: 81 year old male with acute onset right sided clumsiness/mild weakness consistent with an acute infarct. He'll need to be admitted for risk factor stratification and modification.  Recommendations: 1. HgbA1c, fasting lipid panel 2. MRI, MRA  of the brain without contrast 3. Frequent neuro checks 4. Echocardiogram 5. Carotid dopplers 6. Prophylactic therapy-Antiplatelet med: Aspirin - dose 325mg  PO or 300mg  PR 7. Risk factor modification 8. Telemetry monitoring 9. PT consult, OT consult, Speech consult    Roland Rack, MD [facility] [phone]  If 7pm- 7am, please page neurology on call as listed in [place].

## 2015-05-27 NOTE — Progress Notes (Signed)
STROKE TEAM PROGRESS NOTE   HISTORY Henry Wiggins is an 81 y.o. 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 He also initially endorsed difficulty speaking which was not slurred speech but rather difficulty with getting words out. His speech improved by the time of his arrival to the emergency room, but he continues to have right arm weakness.  LKW: 9:30 PM tpa given?: no, mild symptoms  SUBJECTIVE (INTERVAL HISTORY) No family members present. The patient feels his speech is back to baseline. He continues to have weakness and poor control of his right upper extremity. He denies any history of palpitations. He admits that his diabetes has been poorly controlled. He states that he was not taking aspirin on a daily basis prior to admission. Dr. Erlinda Hong discussed probable need for TEE and loop placement.   OBJECTIVE Temp:  [97.6 F (36.4 C)-98.2 F (36.8 C)] 98.2 F (36.8 C) (06/05 1344) Pulse Rate:  [70-144] 75 (06/05 1344) Cardiac Rhythm:  [-]  Resp:  [15-25] 18 (06/05 1344) BP: (163-190)/(68-91) 163/91 mmHg (06/05 1344) SpO2:  [92 %-99 %] 99 % (06/05 1344) Weight:  [202 lb 13.2 oz (92 kg)] 202 lb 13.2 oz (92 kg) (06/05 0054)   Recent Labs Lab 05/27/15 0609  GLUCAP 220*    Recent Labs Lab 05/26/15 2244 05/26/15 2301 05/27/15 1136  NA 139 139 135  K 4.1 4.2 4.1  CL 100* 102 99*  CO2 25  --  26  GLUCOSE 258* 264* 273*  BUN 28* 31* 22*  CREATININE 1.66* 1.60* 1.14  CALCIUM 9.5  --  9.3    Recent Labs Lab 05/26/15 2244 05/27/15 1136  AST 39 33  ALT 30 28  ALKPHOS 63 69  BILITOT 0.5 0.7  PROT 6.2* 6.6  ALBUMIN 3.5 3.6    Recent Labs Lab 05/26/15 2244 05/26/15 2301 05/27/15 1136  WBC 7.2  --  6.3  NEUTROABS 4.4  --   --   HGB 13.3 13.6 13.5  HCT 39.2 40.0 40.0  MCV 90.3  --  90.5  PLT 186  --  183   No results for input(s): CKTOTAL, CKMB, CKMBINDEX, TROPONINI in  the last 168 hours.  Recent Labs  05/26/15 2244  LABPROT 14.8  INR 1.14    Recent Labs  05/27/15 0223  COLORURINE YELLOW  LABSPEC 1.021  PHURINE 6.0  GLUCOSEU 500*  HGBUR NEGATIVE  BILIRUBINUR NEGATIVE  KETONESUR 15*  PROTEINUR NEGATIVE  UROBILINOGEN 0.2  NITRITE NEGATIVE  LEUKOCYTESUR NEGATIVE       Component Value Date/Time   CHOL 275* 05/27/2015 1136   TRIG 289* 05/27/2015 1136   HDL 40* 05/27/2015 1136   CHOLHDL 6.9 05/27/2015 1136   VLDL 58* 05/27/2015 1136   LDLCALC 177* 05/27/2015 1136   No results found for: HGBA1C    Component Value Date/Time   LABOPIA NONE DETECTED 05/27/2015 0223   COCAINSCRNUR NONE DETECTED 05/27/2015 0223   LABBENZ NONE DETECTED 05/27/2015 0223   AMPHETMU NONE DETECTED 05/27/2015 0223   THCU NONE DETECTED 05/27/2015 0223   LABBARB NONE DETECTED 05/27/2015 0223     Recent Labs Lab 05/26/15 2244  ETH <5    Dg Chest 2 View 05/27/2015    1. Borderline cardiomegaly without failure.  2. Mild interstitial coarsening appears chronic.  3. Degenerative changes as detailed above.     Ct Head Wo Contrast 05/26/2015  1. No acute intracranial pathology seen on CT.  2. Mild to moderate cortical volume loss and diffuse small vessel ischemic microangiopathy.  3. Scattered chronic lacunar infarcts at the corona radiata bilaterally, and chronic ischemic change at the basal ganglia.  MRI / MRA Brain without contrast 05/27/2015  MRI HEAD 1. Small acute ischemic infarcts involving the cortical white matter of the left precentral gyrus in the posterior left frontal lobe as well as the underlying white matter. No associated hemorrhage or significant mass effect. 2. Moderate generalized cerebral atrophy with chronic microvascular ischemic disease and scattered remote lacunar infarcts.  MRA HEAD  1. No proximal branch occlusion identified within the intracranial circulation. 2. Atheromatous irregularity within the mid left M1 segment with  associated mild stenosis. 3. Atheromatous irregularity within the mid right P2 segment with associated moderate to severe stenoses. 4. Short-segment moderate stenosis within the right vertebral artery. Left vertebral artery is dominant and widely patent.  2-D echocardiogram 05/27/2015 Study Conclusions - Left ventricle: The cavity size was normal. Wall thickness was increased in a pattern of moderate LVH. Systolic function was vigorous. The estimated ejection fraction was in the range of 65% to 70%. Doppler parameters are consistent with abnormal left ventricular relaxation (grade 1 diastolic dysfunction). - Aortic valve: Mildly calcified annulus. Mildly thickened leaflets. Valve area (VTI): 4.15 cm^2. Valve area (Vmax): 3.82 cm^2. - Mitral valve: Mildly calcified annulus. Mildly thickened leaflets - Technically difficult study.  CUS - pending  LE venous Doppler - pending  PHYSICAL EXAM  Temp:  [97.6 F (36.4 C)-98.2 F (36.8 C)] 98.2 F (36.8 C) (06/05 1344) Pulse Rate:  [70-144] 75 (06/05 1344) Resp:  [15-25] 18 (06/05 1344) BP: (163-190)/(68-91) 163/91 mmHg (06/05 1344) SpO2:  [92 %-99 %] 99 % (06/05 1344) Weight:  [202 lb 13.2 oz (92 kg)] 202 lb 13.2 oz (92 kg) (06/05 0054)  General - Well nourished, well developed, in no apparent distress.  Ophthalmologic - Fundi not visualized due to small pupils.  Cardiovascular - Regular rate and rhythm.  Mental Status -  Level of arousal and orientation to time, place, and person were intact. Language including expression, naming, repetition, comprehension was assessed and found intact. Attention span and concentration were normal. Recent and remote memory were intact. Fund of Knowledge was assessed and was intact.  Cranial Nerves II - XII - II - Visual field intact OU. III, IV, VI - Extraocular movements intact. V - Facial sensation intact bilaterally. VII - Facial movement intact bilaterally. VIII - Hearing &  vestibular intact bilaterally. X - Palate elevates symmetrically. XI - Chin turning & shoulder shrug intact bilaterally. XII - Tongue protrusion intact.  Motor Strength - The patient's strength was normal in all extremities except RUE 4/5 distally with difficulty with dexterity and pronator drift was absent.  Bulk was normal and fasciculations were absent.   Motor Tone - Muscle tone was assessed at the neck and appendages and was normal.  Reflexes - The patient's reflexes were 1+ in all extremities and he had no pathological reflexes.  Sensory - Light touch, temperature/pinprick were assessed and were symmetrical.    Coordination - The patient had right upper extremity ataxia which is out of proportion to the weakness.  Tremor was absent.  Gait and Station - deferred due to safety concerns.  ASSESSMENT/PLAN Mr. Elijio Landsberger Speltz is a 81 y.o. male with history of diabetes mellitus, hypertension, and hyperlipidemia, presenting with transient speech difficulties and right upper extremity weakness.Marland Kitchen He did not receive  IV t-PA due to mild deficits.   Strokes:  Dominant left MCA territory small infarcts,  embolic pattern - unknown source.  Resultant RUE weakness with ataxia.  MRI  small acute left MCA infarcts as noted above.  MRA moderate to severe stenosis within the mid right P2 segment  Carotid Doppler pending  2D Echo no cardiac source of emboli identified. EF 65-70%.  LDL 177, not at goal  HgbA1c pending  LE venous Doppler pending  Due to concerning for cardio embolic etiology, recommend TEE and possible loop recorder placement  SCDs for VTE prophylaxis Diet heart healthy/carb modified Room service appropriate?: Yes; Fluid consistency:: Thin Diet NPO time specified  no antithrombotic prior to admission, now on aspirin 325 mg orally every day  Patient counseled to be compliant with his antithrombotic medications  Ongoing aggressive stroke risk factor management  Therapy  recommendations:  Pending  Disposition:  Pending  Hypertension  Home meds: Losartan/hydrochlorothiazide  Blood pressure mildly high.  Permissive hypertension <220/120 for 24-48 hours and then gradually normalize within 5-7 days  Patient counseled to be compliant with his blood pressure medications  Hyperlipidemia  Home meds:  No lipid lowering medications prior to admission.  LDL 177, goal < 70  Add lipitor 40 mg  Continue statin at discharge  Diabetes  HgbA1c pending, goal < 7.0  Uncontrolled at home as per patient  CBG monitoring  SSI  Other Stroke Risk Factors  Advanced age  Cigarette smoker, quit smoking 60 years ago.  Hx stroke/TIA (remote lacunar infarcts by MRI)   Other Active Problems  Renal insufficiency  Other Pertinent History  Patient states he was not taking aspirin on a daily basis prior to admission.  Hospital day # 1  Mikey Bussing PA-C [facility] Pager [phone] 05/27/2015, 2:15 PM  I, the attending vascular neurologist, have personally obtained a history, examined the patient, evaluated laboratory data, individually viewed imaging studies and agree with radiology interpretations. Together with the NP/PA, we formulated the assessment and plan of care which reflects our mutual decision.  I have made any additions or clarifications directly to the above note and agree with the findings and plan as currently documented.   81 year old male with history of HTN, DM, HLD, bladder cancer cured presented with right upper extremity weakness and slurry speech. Offered PRISM trial but declined. 3 speech getting better, still has right upper extremity distal weakness with ataxia. MRI showed left MCA cortical infarct. Concerning for embolic pattern. Carotid Doppler and lower extremity venous Doppler pending. We will plan for TEE and loop recorder in a.m. Continue aspirin. LDL was high 177, started Lipitor 40.  Rosalin Hawking, MD  PhD Stroke Neurology 05/27/2015 2:19 PM    To contact Stroke Continuity provider, please refer to [email or web address] After hours, contact General Neurology

## 2015-05-27 NOTE — Progress Notes (Signed)
Pt arrived to unit alert and oriented. Oriented to unit and room. Call bell at side. Bed alarm activated. Will continue to monitor. Bobbye Charleston, RN

## 2015-05-27 NOTE — Progress Notes (Signed)
Echocardiogram 2D Echocardiogram has been performed.  Tresa Res 05/27/2015, 9:50 AM

## 2015-05-27 NOTE — ED Notes (Signed)
Dr. Kim at bedside   

## 2015-05-27 NOTE — Progress Notes (Addendum)
[facility]                                                                              Patient Demographics  Henry Wiggins, is a 81 y.o. male, DOB - 1927-04-24, [id number]  Admit date - 05/26/2015   Admitting Physician Jani Gravel, MD  Outpatient Primary MD for the patient is No primary care provider on file.  LOS - 1   Chief Complaint  Patient presents with  . Code Stroke      HPI on 05/26/2015 by Dr. Jani Gravel  81 yo male with dm2, htn, hyperlipidemia, Was out powerwashing and about 3:30 walking up on porch, had a strange feeling in the left leg. It was not responding to what he wanted it to do. The sensation lasted for about 10 minutes. Pt wife went to get aspirin. And then he ate dinner. And then went to turn television on to watch [employer]. Pt had difficulty speaking, didn't slurr words but difficult to get out. Went to pick up a glass but hand would not work right. Pt 's family call ambulance and pt came to ED for evaluation. CT brain showed bilateral lacunar infarcts new from 2011. EKG showed nsr at 80. Pt notes that bp has been high for ? Years. ED consulted neurology for code stroke. Pt will be admitted for cva.   Assessment & Plan    Acute CVA -CT head: No acute intracranial pathology -MRI brain: Small acute ischemic infarcts involving the cortical white matter of the left precentral gyrus, posterior left frontal lobe -Echocardiogram: pending -Carotid doppler: pending -LDL pending -hemoglobin A1c pending -PT/OT consulted pending -Neurology consulted and appreciated, pending recommendations -Continue aspirin (patient was not taking aspirin at home)  Acute kidney injury vs CKD? -Renal ultrasound: Bilateral renal cortical thinning with small left renal cyst, no evidence of hydronephrosis -Baseline creatinine approximately 1.2 (in 2011) -Creatinine currently 1.6, will continue to monitor BMP  Diabetes mellitus, type 2 -Metformin held -Hemoglobin A1c  pending -Continue insulin sliding scale CBG monitoring  Hypertension -Hyzaar held -Allow for permissive hypertension -Continue hydralazine PRN  Code Status: Full  Family Communication: None at bedside  Disposition Plan: Admitted, pending further CVA workup  Time Spent in minutes   30 minutes  Procedures  Echocardiogram Renal US  Consults   Neurology  DVT Prophylaxis  SCDs  Lab Results  Component Value Date   PLT 186 05/26/2015    Medications  Scheduled Meds: . insulin aspart  0-5 Units Subcutaneous QHS  . insulin aspart  0-9 Units Subcutaneous TID WC   Continuous Infusions: . sodium chloride 75 mL/hr at 05/27/15 0312   PRN Meds:.hydrALAZINE  Antibiotics    Anti-infectives    None      Subjective:   [name] seen and examined today.  Patient states he still has a little weakness in his right upper extremity. He denies any vision changes, headache, dizziness. Patient inquires about going home. Currently denies any chest pain or shortness of breath.  Objective:   Filed Vitals:   05/27/15 0054 05/27/15 0300 05/27/15 0500 05/27/15 0907  BP: 187/68 181/75 173/68 179/74  Pulse: 76 73 96 70  Temp: 97.9 F (  36.6 C) 97.6 F (36.4 C) 97.9 F (36.6 C) 97.7 F (36.5 C)  TempSrc: Oral Oral Oral Oral  Resp: 18 17 18 18   Height: 5\' 9"  (1.753 m)     Weight: 92 kg (202 lb 13.2 oz)     SpO2: 95% 95% 95% 96%    Wt Readings from Last 3 Encounters:  05/27/15 92 kg (202 lb 13.2 oz)     Intake/Output Summary (Last 24 hours) at 05/27/15 1106 Last data filed at 05/27/15 0555  Gross per 24 hour  Intake      0 ml  Output    550 ml  Net   -550 ml    Exam  General: Well developed, well nourished, NAD, appears stated age  HEENT: NCAT, PERRLA, EOMI, Anicteic Sclera, mucous membranes moist.   Cardiovascular: S1 S2 auscultated, no rubs, murmurs or gallops. Regular rate and rhythm.  Respiratory: Clear to auscultation bilaterally with equal chest  rise  Abdomen: Soft, nontender, nondistended, + bowel sounds  Extremities: warm dry without cyanosis clubbing or edema  Neuro: AAOx3, cranial nerves grossly intact. Strength intact, right upper extremity dysmetria with finger-nose testing  Psych: Normal affect and demeanor with intact judgement and insight    Data Review   Micro Results No results found for this or any previous visit (from the past 240 hour(s)).  Radiology Reports Dg Chest 2 View  05/27/2015   CLINICAL DATA:  Stroke.  Diabetes and hypertension.  EXAM: CHEST - 2 VIEW  COMPARISON:  Two-view chest x-ray 07/04/2010  FINDINGS: Mild cardiac enlargement is exaggerated by low lung volumes. Vascular calcifications are present at the aortic arch. Interstitial coarsening appears chronic. No focal airspace consolidation is present. There is no edema or effusion to suggest failure. Fusion of anterior osteophytes is noted across multiple levels in the thoracic spine. Healed right-sided rib fractures are again noted. Degenerative changes are present at shoulders.  IMPRESSION: 1. Borderline cardiomegaly without failure. 2. Mild interstitial coarsening appears chronic. 3. Degenerative changes as detailed above.   Electronically Signed   By: San Morelle M.D.   On: 05/27/2015 07:38   Ct Head Wo Contrast  05/26/2015   CLINICAL DATA:  Code stroke. Right upper extremity weakness and slurred speech. Initial encounter.  EXAM: CT HEAD WITHOUT CONTRAST  TECHNIQUE: Contiguous axial images were obtained from the base of the skull through the vertex without intravenous contrast.  COMPARISON:  MRI of the brain performed 10/12/2007, and CT of the head performed 07/30/2010  FINDINGS: There is no evidence of acute infarction, mass lesion, or intra- or extra-axial hemorrhage on CT.  Prominence of the ventricles and sulci reflects mild to moderate cortical volume loss. Cerebellar atrophy is noted. Diffuse periventricular and subcortical white matter  change likely reflects small vessel ischemic microangiopathy. Mild chronic ischemic change is noted at the basal ganglia bilaterally. Scattered chronic lacunar infarcts are noted at the corona radiata bilaterally.  The brainstem and fourth ventricle are within normal limits. The cerebral hemispheres demonstrate grossly normal gray-white differentiation. No mass effect or midline shift is seen.  There is no evidence of fracture; visualized osseous structures are unremarkable in appearance. The orbits are within normal limits. The paranasal sinuses and mastoid air cells are well-aerated. No significant soft tissue abnormalities are seen.  IMPRESSION: 1. No acute intracranial pathology seen on CT. 2. Mild to moderate cortical volume loss and diffuse small vessel ischemic microangiopathy. 3. Scattered chronic lacunar infarcts at the corona radiata bilaterally, and chronic ischemic change at the  basal ganglia.  These results were called by telephone at the time of interpretation on 05/26/2015 at 11:01 pm to Dr. Leonel Ramsay, who verbally acknowledged these results.   Electronically Signed   By: Garald Balding M.D.   On: 05/26/2015 23:01   Mr Brain Wo Contrast  05/27/2015   CLINICAL DATA:  Initial evaluation for acute right arm and leg weakness with speech difficulty. Speech now improved, but with persistent right arm weakness.  EXAM: MRI HEAD WITHOUT CONTRAST  MRA HEAD WITHOUT CONTRAST  TECHNIQUE: Multiplanar, multiecho pulse sequences of the brain and surrounding structures were obtained without intravenous contrast. Angiographic images of the head were obtained using MRA technique without contrast.  COMPARISON:  Prior CT from 05/26/2015.  FINDINGS: MRI HEAD FINDINGS  Diffuse prominence of the CSF containing spaces is compatible with generalized cerebral atrophy. Patchy and confluent T2/FLAIR hyperintensity present within the periventricular and deep white matter of both cerebral hemispheres most compatible with moderate  chronic small vessel ischemic disease. Few scattered subcentimeter remote lacunar infarcts present within the bilateral basal ganglia, right greater than left. Additional small chronic infarcts involving the anterior genu and posterior body of the corpus callosum, best seen on sagittal T1 weighted sequence (series 5, image 13). Suspected small remote infarct within the posterior right temporal lobe as well with associated chronic blood products (series 9, image 41). Small chronic micro hemorrhage noted within the left parietal lobe as well.  There are two adjacent small acute ischemic infarcts involving the cortical gray matter of the precentral gyrus in the posterior left frontal lobe, the largest of which measures 8 mm (series 3, images 39-42). Additional 1 cm linear ischemic infarct within the underlying subcortical and deep white matter. No significant mass effect. No associated hemorrhage. No other infarct identified. Scattered hyperintense signal foci seen more inferiorly within the left parietal lobe on coronal DWI sequence favored to be artifactual in nature with no corresponding signal seen on axial sequence (series 8, image 6).  No mass lesion, mass effect, or midline shift. Mild ventricular prominence related to global parenchymal volume loss present without hydrocephalus. No extra-axial fluid collection.  Craniocervical junction widely patent and normal in appearance. Pituitary gland unremarkable. No acute abnormality about the orbits. Sequela of prior bilateral lens extraction noted.  Paranasal sinuses are clear. Trace fluid noted within the left mastoid air cells. Mastoid air cells are otherwise clear. Inner ear structures normal.  Mild degenerative changes noted within the upper cervical spine. Bone marrow signal intensity normal. Scalp soft tissues unremarkable.  MRA HEAD FINDINGS  ANTERIOR CIRCULATION:  The visualized portions of the distal cervical segments of the internal carotid arteries are  widely patent with antegrade flow. The petrous segments are widely patent. Multi focal atheromatous irregularity present within the cavernous segments of the internal carotid arteries bilaterally, left greater than right. Associated mild narrowing without high-grade stenosis. Supraclinoid segments widely patent. A1 segments, anterior communicating artery common anterior cerebral artery well opacified.  Multi focal atheromatous irregularity within the left M1 segment with secondary mild stenosis of the mid left M1 segment (series 405, image 9). Right M1 segment demonstrates mild irregularity but widely patent without stenosis. Mild distal branch irregularity within the MCA branches bilaterally.  POSTERIOR CIRCULATION:  Vertebral arteries are patent to the vertebrobasilar junction. Small focal moderate stenosis within the right vertebral artery prior to the origin of the right PICA. The left vertebral artery is dominant. Posterior inferior cerebellar arteries patent bilaterally. Anterior inferior cerebellar arteries well opacified. Basilar artery demonstrates  mild irregularity without hemodynamically significant stenosis. Superior cerebellar arteries patent proximally. Posterior cerebral arteries patent bilaterally. There is focal moderate to severe stenoses within the mid and distal right P2 segment.  No aneurysm.  IMPRESSION: MRI HEAD IMPRESSION:  1. Small acute ischemic infarcts involving the cortical white matter of the left precentral gyrus in the posterior left frontal lobe as well as the underlying white matter. No associated hemorrhage or significant mass effect. 2. Moderate generalized cerebral atrophy with chronic microvascular ischemic disease and scattered remote lacunar infarcts as detailed above.  MRA HEAD IMPRESSION:  1. No proximal branch occlusion identified within the intracranial circulation. 2. Atheromatous irregularity within the mid left M1 segment with associated mild stenosis. 3. Atheromatous  irregularity within the mid right P2 segment with associated moderate to severe stenoses. 4. Short-segment moderate stenosis within the right vertebral artery. Left vertebral artery is dominant and widely patent.   Electronically Signed   By: Jeannine Boga M.D.   On: 05/27/2015 09:40   US Renal  05/27/2015   CLINICAL DATA:  Hypertension, acute renal failure, diabetes, stroke, bladder cancer, former smoker  EXAM: RENAL / URINARY TRACT ULTRASOUND COMPLETE  COMPARISON:  CT abdomen and pelvis 02/14/2014  FINDINGS: Right Kidney:  Length: 11.1 cm. Cortical thinning. Normal cortical echogenicity. No mass, hydronephrosis or shadowing calcification.  Left Kidney:  Length: 12.2 cm. Cortical thinning. Normal cortical echogenicity. Small cyst upper pole 14 x 13 x 17 mm. Additional small cyst upper pole 12 x 12 x 12 mm. No solid mass, hydronephrosis or shadowing calcification.  Bladder:  Normally distended. Question nodular density posterior bladder base, 17 x 14 x 17 mm, could represent a bladder tumor or extension of central lobe of prostate gland.  IMPRESSION: BILATERAL renal cortical thinning with small LEFT renal cyst.  No evidence of hydronephrosis.  17 x 14 x 17 mm diameter nodular focus at posterior bladder base, question bladder tumor versus prominent central lobe of prostate.   Electronically Signed   By: Lavonia Dana M.D.   On: 05/27/2015 10:26   Mr Jodene Nam Head/brain Wo Cm  05/27/2015   CLINICAL DATA:  Initial evaluation for acute right arm and leg weakness with speech difficulty. Speech now improved, but with persistent right arm weakness.  EXAM: MRI HEAD WITHOUT CONTRAST  MRA HEAD WITHOUT CONTRAST  TECHNIQUE: Multiplanar, multiecho pulse sequences of the brain and surrounding structures were obtained without intravenous contrast. Angiographic images of the head were obtained using MRA technique without contrast.  COMPARISON:  Prior CT from 05/26/2015.  FINDINGS: MRI HEAD FINDINGS  Diffuse prominence of the  CSF containing spaces is compatible with generalized cerebral atrophy. Patchy and confluent T2/FLAIR hyperintensity present within the periventricular and deep white matter of both cerebral hemispheres most compatible with moderate chronic small vessel ischemic disease. Few scattered subcentimeter remote lacunar infarcts present within the bilateral basal ganglia, right greater than left. Additional small chronic infarcts involving the anterior genu and posterior body of the corpus callosum, best seen on sagittal T1 weighted sequence (series 5, image 13). Suspected small remote infarct within the posterior right temporal lobe as well with associated chronic blood products (series 9, image 41). Small chronic micro hemorrhage noted within the left parietal lobe as well.  There are two adjacent small acute ischemic infarcts involving the cortical gray matter of the precentral gyrus in the posterior left frontal lobe, the largest of which measures 8 mm (series 3, images 39-42). Additional 1 cm linear ischemic infarct within the underlying subcortical and  deep white matter. No significant mass effect. No associated hemorrhage. No other infarct identified. Scattered hyperintense signal foci seen more inferiorly within the left parietal lobe on coronal DWI sequence favored to be artifactual in nature with no corresponding signal seen on axial sequence (series 8, image 6).  No mass lesion, mass effect, or midline shift. Mild ventricular prominence related to global parenchymal volume loss present without hydrocephalus. No extra-axial fluid collection.  Craniocervical junction widely patent and normal in appearance. Pituitary gland unremarkable. No acute abnormality about the orbits. Sequela of prior bilateral lens extraction noted.  Paranasal sinuses are clear. Trace fluid noted within the left mastoid air cells. Mastoid air cells are otherwise clear. Inner ear structures normal.  Mild degenerative changes noted within the  upper cervical spine. Bone marrow signal intensity normal. Scalp soft tissues unremarkable.  MRA HEAD FINDINGS  ANTERIOR CIRCULATION:  The visualized portions of the distal cervical segments of the internal carotid arteries are widely patent with antegrade flow. The petrous segments are widely patent. Multi focal atheromatous irregularity present within the cavernous segments of the internal carotid arteries bilaterally, left greater than right. Associated mild narrowing without high-grade stenosis. Supraclinoid segments widely patent. A1 segments, anterior communicating artery common anterior cerebral artery well opacified.  Multi focal atheromatous irregularity within the left M1 segment with secondary mild stenosis of the mid left M1 segment (series 405, image 9). Right M1 segment demonstrates mild irregularity but widely patent without stenosis. Mild distal branch irregularity within the MCA branches bilaterally.  POSTERIOR CIRCULATION:  Vertebral arteries are patent to the vertebrobasilar junction. Small focal moderate stenosis within the right vertebral artery prior to the origin of the right PICA. The left vertebral artery is dominant. Posterior inferior cerebellar arteries patent bilaterally. Anterior inferior cerebellar arteries well opacified. Basilar artery demonstrates mild irregularity without hemodynamically significant stenosis. Superior cerebellar arteries patent proximally. Posterior cerebral arteries patent bilaterally. There is focal moderate to severe stenoses within the mid and distal right P2 segment.  No aneurysm.  IMPRESSION: MRI HEAD IMPRESSION:  1. Small acute ischemic infarcts involving the cortical white matter of the left precentral gyrus in the posterior left frontal lobe as well as the underlying white matter. No associated hemorrhage or significant mass effect. 2. Moderate generalized cerebral atrophy with chronic microvascular ischemic disease and scattered remote lacunar infarcts as  detailed above.  MRA HEAD IMPRESSION:  1. No proximal branch occlusion identified within the intracranial circulation. 2. Atheromatous irregularity within the mid left M1 segment with associated mild stenosis. 3. Atheromatous irregularity within the mid right P2 segment with associated moderate to severe stenoses. 4. Short-segment moderate stenosis within the right vertebral artery. Left vertebral artery is dominant and widely patent.   Electronically Signed   By: Jeannine Boga M.D.   On: 05/27/2015 09:40    CBC  Recent Labs Lab 05/26/15 2244 05/26/15 2301  WBC 7.2  --   HGB 13.3 13.6  HCT 39.2 40.0  PLT 186  --   MCV 90.3  --   MCH 30.6  --   MCHC 33.9  --   RDW 13.3  --   LYMPHSABS 1.7  --   MONOABS 0.8  --   EOSABS 0.3  --   BASOSABS 0.0  --     Chemistries   Recent Labs Lab 05/26/15 2244 05/26/15 2301  NA 139 139  K 4.1 4.2  CL 100* 102  CO2 25  --   GLUCOSE 258* 264*  BUN 28* 31*  CREATININE  1.66* 1.60*  CALCIUM 9.5  --   AST 39  --   ALT 30  --   ALKPHOS 63  --   BILITOT 0.5  --    ------------------------------------------------------------------------------------------------------------------ estimated creatinine clearance is 37.8 mL/min (by C-G formula based on Cr of 1.6). ------------------------------------------------------------------------------------------------------------------ No results for input(s): HGBA1C in the last 72 hours. ------------------------------------------------------------------------------------------------------------------ No results for input(s): CHOL, HDL, LDLCALC, TRIG, CHOLHDL, LDLDIRECT in the last 72 hours. ------------------------------------------------------------------------------------------------------------------ No results for input(s): TSH, T4TOTAL, T3FREE, THYROIDAB in the last 72 hours.  Invalid input(s):  FREET3 ------------------------------------------------------------------------------------------------------------------ No results for input(s): VITAMINB12, FOLATE, FERRITIN, TIBC, IRON, RETICCTPCT in the last 72 hours.  Coagulation profile  Recent Labs Lab 05/26/15 2244  INR 1.14    No results for input(s): DDIMER in the last 72 hours.  Cardiac Enzymes No results for input(s): CKMB, TROPONINI, MYOGLOBIN in the last 168 hours.  Invalid input(s): CK ------------------------------------------------------------------------------------------------------------------ Invalid input(s): POCBNP    Johna Kearl D.O. on 05/27/2015 at 11:06 AM  Between 7am to 7pm - Pager - [phone]  After 7pm go to [email or web address] - password [email or web address]  And look for the night coverage person covering for me after hours  [facility] Office  [phone]

## 2015-05-27 NOTE — Progress Notes (Signed)
PT Cancellation Note  Patient Details Name: Henry Wiggins MRN: [id number] DOB: 12-01-1927   Cancelled Treatment:    Reason Eval/Treat Not Completed: Other (comment) (PT order to "start tomorrow").  PT will continue to follow acutely.  Thank you for this order.   Joslyn Hy PT, DPT [phone] Pager: [phone]  05/27/2015, 12:32 PM

## 2015-05-28 ENCOUNTER — Inpatient Hospital Stay (HOSPITAL_COMMUNITY): Payer: Medicare Other

## 2015-05-28 ENCOUNTER — Encounter (HOSPITAL_COMMUNITY)
Admission: EM | Disposition: A | Discharge status: Home Health | Payer: Self-pay | Source: Home / Self Care | Attending: Internal Medicine

## 2015-05-28 ENCOUNTER — Encounter (HOSPITAL_COMMUNITY): Payer: Self-pay

## 2015-05-28 ENCOUNTER — Ambulatory Visit (HOSPITAL_COMMUNITY): Admit: 2015-05-28 | Payer: Self-pay | Admitting: Cardiovascular Disease

## 2015-05-28 DIAGNOSIS — I639 Cerebral infarction, unspecified: Secondary | ICD-10-CM

## 2015-05-28 DIAGNOSIS — I63412 Cerebral infarction due to embolism of left middle cerebral artery: Secondary | ICD-10-CM

## 2015-05-28 DIAGNOSIS — E1159 Type 2 diabetes mellitus with other circulatory complications: Secondary | ICD-10-CM

## 2015-05-28 DIAGNOSIS — E785 Hyperlipidemia, unspecified: Secondary | ICD-10-CM

## 2015-05-28 DIAGNOSIS — I351 Nonrheumatic aortic (valve) insufficiency: Secondary | ICD-10-CM

## 2015-05-28 HISTORY — PX: TEE WITHOUT CARDIOVERSION: SHX5443

## 2015-05-28 HISTORY — PX: EP IMPLANTABLE DEVICE: SHX172B

## 2015-05-28 LAB — BASIC METABOLIC PANEL
Anion gap: 11 (ref 5–15)
BUN: 25 mg/dL — ABNORMAL HIGH (ref 6–20)
CO2: 24 mmol/L (ref 22–32)
Calcium: 9 mg/dL (ref 8.9–10.3)
Chloride: 102 mmol/L (ref 101–111)
Creatinine, Ser: 1.12 mg/dL (ref 0.61–1.24)
GFR calc Af Amer: >60 mL/min (ref >60)
GFR calc non Af Amer: 58 mL/min — ABNORMAL LOW (ref >60)
Glucose, Bld: 187 mg/dL — ABNORMAL HIGH (ref 65–99)
Potassium: 4.5 mmol/L (ref 3.5–5.1)
Sodium: 137 mmol/L (ref 135–145)

## 2015-05-28 LAB — CBC
HCT: 39.7 % (ref 39.0–52.0)
Hemoglobin: 13.2 g/dL (ref 13.0–17.0)
MCH: 30 pg (ref 26.0–34.0)
MCHC: 33.2 g/dL (ref 30.0–36.0)
MCV: 90.2 fL (ref 78.0–100.0)
Platelets: 169 10*3/uL (ref 150–400)
RBC: 4.4 MIL/uL (ref 4.22–5.81)
RDW: 13.2 % (ref 11.5–15.5)
WBC: 7.2 10*3/uL (ref 4.0–10.5)

## 2015-05-28 LAB — CALCIUM / CREATININE RATIO, URINE
Calcium, Ur: 9.2 mg/dL
Calcium/Creat.Ratio: 80 mg/g creat (ref 0–260)
Creatinine, Urine: 114.6 mg/dL

## 2015-05-28 LAB — GLUCOSE, CAPILLARY
Glucose-Capillary: 185 mg/dL — ABNORMAL HIGH (ref 65–99)
Glucose-Capillary: 188 mg/dL — ABNORMAL HIGH (ref 65–99)

## 2015-05-28 LAB — ANTINUCLEAR ANTIBODIES, IFA: ANA Ab, IFA: NEGATIVE

## 2015-05-28 LAB — HEMOGLOBIN A1C
Hgb A1c MFr Bld: 8.8 % — ABNORMAL HIGH (ref 4.8–5.6)
Mean Plasma Glucose: 206 mg/dL

## 2015-05-28 SURGERY — LOOP RECORDER INSERTION
Anesthesia: LOCAL

## 2015-05-28 SURGERY — ECHOCARDIOGRAM, TRANSESOPHAGEAL
Anesthesia: Moderate Sedation

## 2015-05-28 MED ORDER — ASPIRIN 325 MG PO TABS
325.0000 mg | ORAL_TABLET | Freq: Every day | ORAL | Status: DC
Start: 2015-05-28 — End: 2015-06-06

## 2015-05-28 MED ORDER — MIDAZOLAM HCL 10 MG/2ML IJ SOLN
INTRAMUSCULAR | Status: DC | PRN
  Administered 2015-05-28: 1 mg via INTRAVENOUS
  Administered 2015-05-28: 2 mg via INTRAVENOUS

## 2015-05-28 MED ORDER — LOSARTAN POTASSIUM-HCTZ 100-25 MG PO TABS: 1.0000 | ORAL_TABLET | Freq: Every day | ORAL | Status: DC

## 2015-05-28 MED ORDER — FENTANYL CITRATE (PF) 100 MCG/2ML IJ SOLN
INTRAMUSCULAR | Status: DC | PRN
  Administered 2015-05-28: 25 ug via INTRAVENOUS

## 2015-05-28 MED ORDER — FENTANYL CITRATE (PF) 100 MCG/2ML IJ SOLN
INTRAMUSCULAR | Status: AC
  Filled 2015-05-28: qty 2

## 2015-05-28 MED ORDER — BUTAMBEN-TETRACAINE-BENZOCAINE 2-2-14 % EX AERO
INHALATION_SPRAY | CUTANEOUS | Status: DC | PRN
Start: 2015-05-28 — End: 2015-05-28
  Administered 2015-05-28: 2 via TOPICAL

## 2015-05-28 MED ORDER — MIDAZOLAM HCL 5 MG/ML IJ SOLN
INTRAMUSCULAR | Status: AC
Start: 2015-05-28 — End: 2015-05-28
  Filled 2015-05-28: qty 2

## 2015-05-28 MED ORDER — DIPHENHYDRAMINE HCL 50 MG/ML IJ SOLN
INTRAMUSCULAR | Status: AC
  Filled 2015-05-28: qty 1

## 2015-05-28 SURGICAL SUPPLY — 2 items
LOOP REVEAL LINQSYS (Prosthesis & Implant Heart) ×2 IMPLANT
PACK LOOP INSERTION (CUSTOM PROCEDURE TRAY) ×2 IMPLANT

## 2015-05-28 NOTE — Progress Notes (Signed)
  Echocardiogram Echocardiogram Transesophageal has been performed.  Margie Urbanowicz FRANCES 05/28/2015, 1:31 PM

## 2015-05-28 NOTE — Care Management Note (Signed)
Case Management Note  Patient Details  Name: Henry Wiggins MRN: [id number] Date of Birth: July 13, 1927  Subjective/Objective:                    Action/Plan: Met with patient and wife to discuss home health services. Patient is agreeable to home health and has chosen [facility]. Miranda with [facility] was notified and has accepted the referral for discharge home today.  Preferred contact number is [phone].  Expected Discharge Date:                  Expected Discharge Plan:  [place]  In-House Referral:     Discharge planning Services  CM Consult  Post Acute Care Choice:  Home Health Choice offered to:  Patient  DME Arranged:    DME Agency:     HH Arranged:  PT, OT, Speech Therapy [place] Agency:  [place]  Status of Service:     Medicare Important Message Given:  Yes Date Medicare IM Given:  05/28/15 Medicare IM give by:  Lorne Skeens RN, MSN, CM Date Additional Medicare IM Given:    Additional Medicare Important Message give by:     If discussed at [place] of Stay Meetings, dates discussed:    Additional Comments:  Rolm Baptise, RN 05/28/2015, 4:11 PM

## 2015-05-28 NOTE — Progress Notes (Signed)
Occupational Therapy Evaluation Patient Details Name: Quintavious Nauss Krizan MRN: [id number] DOB: Nov 03, 1927 Today's Date: 05/28/2015    History of Present Illness 81 y.o. 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 He also initially endorsed difficulty speaking which was not slurred speech but rather difficulty with getting words out.  MRI revealed Dominant left MCA territory small infarcts, embolic pattern - unknown source.   Clinical Impression   PTA, pt independent with ADL and mobility. Pt with RUE weakness and fine motor/coordination deficits and requires min A with ADL. Began educating pt on HEP for RUE. Pt will need to follow up with [facility] and 24/7 S initially.     Follow Up Recommendations  Home health OT;Supervision/Assistance - 24 hour    Equipment Recommendations  None recommended by OT    Recommendations for Other Services       Precautions / Restrictions Precautions Precautions: Fall      Mobility Bed Mobility Overal bed mobility: Independent                Transfers Overall transfer level: Needs assistance Equipment used: None   Sit to Stand: Supervision              Balance Overall balance assessment: Needs assistance   Sitting balance-Leahy Scale: Good     Standing balance support: During functional activity Standing balance-Leahy Scale: Fair                              ADL Overall ADL's : Needs assistance/impaired     Grooming: Minimal assistance   Upper Body Bathing: Minimal assitance   Lower Body Bathing: Minimal assistance   Upper Body Dressing : Minimal assistance   Lower Body Dressing: Minimal assistance   Toilet Transfer: Supervision/safety   Toileting- Clothing Manipulation and Hygiene: Supervision/safety       Functional mobility during ADLs: Supervision/safety General ADL Comments: wife assisting with ADL      Vision Additional Comments: will further assess              Pertinent Vitals/Pain Pain Assessment: No/denies pain     Hand Dominance Right   Extremity/Trunk Assessment Upper Extremity Assessment Upper Extremity Assessment: RUE deficits/detail RUE Deficits / Details: RUE weakness - greater strength proximally than distally. Ataxic; poor dexterity and in hand manipulation. extensor lag of 4th and 5th fingers. poor BUE integrated coordination. able to pick up a cup. greater difficulty with control and reliease. Beginning to develop isolated finger control. RUE Coordination: decreased fine motor;decreased gross motor   Lower Extremity Assessment Lower Extremity Assessment: Defer to PT evaluation   Cervical / Trunk Assessment Cervical / Trunk Assessment: Normal   Communication Communication Communication: HOH   Cognition Arousal/Alertness: Awake/alert Behavior During Therapy: WFL for tasks assessed/performed Overall Cognitive Status: Impaired/Different from baseline Area of Impairment: Safety/judgement         Safety/Judgement: Decreased awareness of safety;Decreased awareness of deficits         General Comments       Exercises Exercises: Other exercises Other Exercises Other Exercises: fine motor control/coordination Other Exercises: theraputty Other Exercises: weight bearing through RUE   Shoulder Instructions      Home Living Family/patient expects to be discharged to:: Private residence Living Arrangements: Spouse/significant other Available Help at Discharge: Family Type of Home: House Home Access: Stairs to enter Entrance  Stairs-Number of Steps: 5 Entrance Stairs-Rails: Can reach both Home Layout: One level               Home Equipment: None      Lives With: Spouse    Prior Functioning/Environment Level of Independence: Independent             OT Diagnosis: Generalized weakness;Ataxia   OT Problem List: Decreased  strength;Decreased range of motion;Decreased coordination;Impaired tone;Impaired UE functional use   OT Treatment/Interventions:      OT Goals(Current goals can be found in the care plan section) Acute Rehab OT Goals Patient Stated Goal: to go home OT Goal Formulation: All assessment and education complete, DC therapy (acute OT)  OT Frequency:     Barriers to D/C:            Co-evaluation              End of Session Nurse Communication: Mobility status  Activity Tolerance: Patient tolerated treatment well Patient left: in bed;with call bell/phone within reach;with family/visitor present   Time: [id number] OT Time Calculation (min): 28 min Charges:  OT General Charges $OT Visit: 1 Procedure OT Evaluation $Initial OT Evaluation Tier I: 1 Procedure OT Treatments $Therapeutic Activity: 8-22 mins G-Codes:    Torez Beauregard,HILLARY 2015-06-14, 5:23 PM   [facility], OTR/L  [phone] 14-Jun-2015

## 2015-05-28 NOTE — Evaluation (Signed)
Physical Therapy Evaluation Patient Details Name: Henry Wiggins MRN: [id number] DOB: 1927/11/23 Today's Date: 05/28/2015   History of Present Illness  81 y.o. 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 He also initially endorsed difficulty speaking which was not slurred speech but rather difficulty with getting words out.  MRI revealed Dominant left MCA territory small infarcts, embolic pattern - unknown source.  Clinical Impression  Patient demonstrates deficits in functional mobility as indicated below. Will benefit from continued skilled PT to address deficits and maximize function. Will see as indicated and progress as tolerated. Recommend HHPT for safety evaluation in home setting secondary to ataxic gait and high fall risk.    Follow Up Recommendations Home health PT;Supervision for mobility/OOB (initially)    Equipment Recommendations  None recommended by PT    Recommendations for Other Services       Precautions / Restrictions Precautions Precautions: Fall      Mobility  Bed Mobility Overal bed mobility: Independent                Transfers Overall transfer level: Needs assistance Equipment used: None Transfers: Sit to/from Stand Sit to Stand: Min guard         General transfer comment: slightly impulsive, + dizziness with standing  Ambulation/Gait Ambulation/Gait assistance: Min guard;Min assist Ambulation Distance (Feet): 210 Feet Assistive device: None Gait Pattern/deviations: Ataxic;Scissoring;Staggering right;Staggering left;Narrow base of support Gait velocity: decreased Gait velocity interpretation: Below normal speed for age/gender General Gait Details: ataxic gait pattern with several noted LOB (more apparent during slower gait) requiring min assist to stabilize at times, other times able to self correct.  Stairs Stairs: Yes Stairs assistance:  Min guard Stair Management: One rail Right;Step to pattern;Forwards Number of Stairs: 5 General stair comments: alternating pattern ascending, step to pattern descending.  Wheelchair Mobility    Modified Rankin (Stroke Patients Only) Modified Rankin (Stroke Patients Only) Pre-Morbid Rankin Score: No symptoms Modified Rankin: Moderately severe disability     Balance Overall balance assessment: Needs assistance                           High level balance activites: Side stepping;Backward walking;Direction changes;Turns;Sudden stops;Head turns High Level Balance Comments: min assist for stability during higher level balance tasks.             Pertinent Vitals/Pain Pain Assessment: No/denies pain    Home Living Family/patient expects to be discharged to:: Private residence Living Arrangements: Spouse/significant other Available Help at Discharge: Family Type of Home: House Home Access: Stairs to enter Entrance Stairs-Rails: Can reach both Entrance Stairs-Number of Steps: 5 Home Layout: One level Home Equipment: None      Prior Function Level of Independence: Independent               Hand Dominance   Dominant Hand: Right    Extremity/Trunk Assessment   Upper Extremity Assessment: Defer to OT evaluation           Lower Extremity Assessment: Overall WFL for tasks assessed (coordination deficits noted during mobility)         Communication   Communication: HOH  Cognition Arousal/Alertness: Awake/alert Behavior During Therapy: WFL for tasks assessed/performed Overall Cognitive Status: Impaired/Different from baseline Area of Impairment: Safety/judgement         Safety/Judgement: Decreased awareness of safety;Decreased awareness of deficits  General Comments      Exercises        Assessment/Plan    PT Assessment Patient needs continued PT services  PT Diagnosis Difficulty walking;Abnormality of gait   PT  Problem List Decreased balance;Decreased mobility;Decreased coordination;Decreased cognition;Decreased safety awareness  PT Treatment Interventions DME instruction;Gait training;Stair training;Functional mobility training;Therapeutic activities;Therapeutic exercise;Balance training;Patient/family education   PT Goals (Current goals can be found in the Care Plan section) Acute Rehab PT Goals Patient Stated Goal: to go home PT Goal Formulation: With patient Time For Goal Achievement: 06/11/15 Potential to Achieve Goals: Good    Frequency Min 3X/week   Barriers to discharge        Co-evaluation               End of Session Equipment Utilized During Treatment: Gait belt Activity Tolerance: Patient tolerated treatment well Patient left: in chair;with call bell/phone within reach;with chair alarm set Nurse Communication: Mobility status         Time: [id number] PT Time Calculation (min) (ACUTE ONLY): 18 min   Charges:   PT Evaluation $Initial PT Evaluation Tier I: 1 Procedure     PT G CodesDuncan Dull 06/21/2015, 9:18 AM Alben Deeds, PT DPT  [phone]

## 2015-05-28 NOTE — Progress Notes (Signed)
Discharge orders received. Pt and spouse educated on d/c instructions and stroke education. Pt verbalized understanding. Pt given d/c packet. IV and tele removed. Pt dressed and belongings packed. Pt taken downstairs by staff via wheelchair.

## 2015-05-28 NOTE — Interval H&P Note (Signed)
History and Physical Interval Note:  05/28/2015 11:17 AM  Henry Wiggins  has presented today for surgery, with the diagnosis of stroke  The various methods of treatment have been discussed with the patient and family. After consideration of risks, benefits and other options for treatment, the patient has consented to  Procedure(s): TRANSESOPHAGEAL ECHOCARDIOGRAM (TEE) (N/A) as a surgical intervention .  The patient's history has been reviewed, patient examined, no change in status, stable for surgery.  I have reviewed the patient's chart and labs.  Questions were answered to the patient's satisfaction.     [name]

## 2015-05-28 NOTE — Op Note (Signed)
LOOP RECORDER IMPLANT   Procedure report  Procedure performed:  Loop recorder implantation   Reason for procedure:  1. Cryptogenic stroke Procedure performed by:  Sanda Klein, MD  Complications:  None  Estimated blood loss:  <5 mL  Medications administered during procedure:  Lidocaine 1% with 1/10,000 epinephrine 10 mL locally Device details:  Medtronic Reveal Linq model number [id number], serial number [id number] Procedure details:  After the risks and benefits of the procedure were discussed the patient provided informed consent. The patient was prepped and draped in usual sterile fashion. Local anesthesia was administered to an area 2 cm to the left of the sternum in the 4th intercostal space. A cutaneous incision was made using the incision tool. The introducer was then used to create a subcutaneous tunnel and carefully deploy the device. Local pressure was held to ensure hemostasis.  The incision was closed with SteriStrips and a sterile dressing was applied. R waves 0.34 mV.   Sanda Klein, MD, [place] [phone] office [phone] pager 05/28/2015 1:48 PM

## 2015-05-28 NOTE — H&P (View-Only) (Signed)
Neurology Consultation Reason for Consult: Right-sided weakness Referring Physician: Rancour, S  CC: Arm weakness  History is obtained from: Patient  HPI: Henry Wiggins is a 81 y.o. 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 He also initially endorsed difficulty speaking which was not slurred speech but rather difficulty with getting words out. His speech improved by the time of his arrival to the emergency room, but he continues to have right arm weakness.   LKW: 9:30 PM tpa given?: no, mild symptoms    ROS: A 14 point ROS was performed and is negative except as noted in the HPI.   Past Medical History  Diagnosis Date  . Diabetes   . Hypertension   . Hyperlipidemia   . UTI (lower urinary tract infection)   . Bladder cancer     Dr. Janice Norrie    Family History: Mother-hypertension, stroke  Social History: Tob: Denies  Exam: Current vital signs: BP 189/84 mmHg  Pulse 77  Temp(Src) 97.9 F (36.6 C) (Oral)  Resp 25  SpO2 94% Vital signs in last 24 hours: Temp:  [97.9 F (36.6 C)] 97.9 F (36.6 C) (06/04 2309) Pulse Rate:  [74-80] 77 (06/04 2345) Resp:  [16-25] 25 (06/04 2345) BP: (171-189)/(75-91) 189/84 mmHg (06/04 2345) SpO2:  [94 %-96 %] 94 % (06/04 2345)   Physical Exam  Constitutional: Appears well-developed and well-nourished.  Psych: Affect appropriate to situation Eyes: No scleral injection HENT: No OP obstrucion Head: Normocephalic.  Cardiovascular: Normal rate and regular rhythm.  Respiratory: Effort normal and breath sounds normal to anterior ascultation GI: Soft.  No distension. There is no tenderness.  Skin: WDI  Neuro: Mental Status: Patient is awake, alert, oriented to person, place, month, year, and situation. Patient is able to give a clear and coherent history. No signs of aphasia or neglect Cranial Nerves: II: Visual Fields are full. Pupils  are equal, round, and reactive to light.   III,IV, VI: EOMI without ptosis or diploplia.  V: Facial sensation is symmetric to temperature VII: Facial movement is symmetric.  VIII: hearing is intact to voice X: Uvula elevates symmetrically XI: Shoulder shrug is symmetric. XII: tongue is midline without atrophy or fasciculations.  Motor: Tone is normal. Bulk is normal. 5/5 strength was present in the right leg and left arm and leg. He has good proximal strength in the right arm with no drift, however he does have mild weakness of the distal extremity. Sensory: Sensation is symmetric to light touch and temperature in the arms and legs. Cerebellar: FNF and HKS are intact on the left, impaired on the right   I have reviewed labs in epic and the results pertinent to this consultation are: Mildly elevated creatinine Elevated glucose  I have reviewed the images obtained: CT head-unremarkable  Impression: 81 year old male with acute onset right sided clumsiness/mild weakness consistent with an acute infarct. He'll need to be admitted for risk factor stratification and modification.  Recommendations: 1. HgbA1c, fasting lipid panel 2. MRI, MRA  of the brain without contrast 3. Frequent neuro checks 4. Echocardiogram 5. Carotid dopplers 6. Prophylactic therapy-Antiplatelet med: Aspirin - dose 325mg  PO or 300mg  PR 7. Risk factor modification 8. Telemetry monitoring 9. PT consult, OT consult, Speech consult    Roland Rack, MD [facility] [phone]  If 7pm- 7am, please page neurology on call as listed in Reader.

## 2015-05-28 NOTE — Progress Notes (Signed)
STROKE TEAM PROGRESS NOTE   HISTORY Henry Wiggins is an 81 y.o. 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 He also initially endorsed difficulty speaking which was not slurred speech but rather difficulty with getting words out. His speech improved by the time of his arrival to the emergency room, but he continues to have right arm weakness. He was LKW 05/26/2015 at 9:30 PM. tpa not given due to mild symptoms.   SUBJECTIVE (INTERVAL HISTORY) No family at bedside. Pt reports his 4th and 5th finger on his right hand "is the problem". No arm drift. difficulty picking up cup. Ataxia improved some.   OBJECTIVE Temp:  [97.8 F (36.6 C)-98.6 F (37 C)] 97.9 F (36.6 C) (06/06 0603) Pulse Rate:  [66-77] 72 (06/06 0603) Cardiac Rhythm:  [-]  Resp:  [18-20] 18 (06/06 0603) BP: (122-185)/(66-98) 155/66 mmHg (06/06 0603) SpO2:  [93 %-99 %] 97 % (06/06 0603)   Recent Labs Lab 05/27/15 0609 05/27/15 1116 05/27/15 1604 05/27/15 2243 05/28/15 0623  GLUCAP 220* 286* 251* 224* 185*    Recent Labs Lab 05/26/15 2244 05/26/15 2301 05/27/15 1136 05/28/15 0555  NA 139 139 135 137  K 4.1 4.2 4.1 4.5  CL 100* 102 99* 102  CO2 25  --  26 24  GLUCOSE 258* 264* 273* 187*  BUN 28* 31* 22* 25*  CREATININE 1.66* 1.60* 1.14 1.12  CALCIUM 9.5  --  9.3 9.0    Recent Labs Lab 05/26/15 2244 05/27/15 1136  AST 39 33  ALT 30 28  ALKPHOS 63 69  BILITOT 0.5 0.7  PROT 6.2* 6.6  ALBUMIN 3.5 3.6    Recent Labs Lab 05/26/15 2244 05/26/15 2301 05/27/15 1136 05/28/15 0555  WBC 7.2  --  6.3 7.2  NEUTROABS 4.4  --   --   --   HGB 13.3 13.6 13.5 13.2  HCT 39.2 40.0 40.0 39.7  MCV 90.3  --  90.5 90.2  PLT 186  --  183 169   No results for input(s): CKTOTAL, CKMB, CKMBINDEX, TROPONINI in the last 168 hours.  Recent Labs  05/26/15 2244  LABPROT 14.8  INR 1.14    Recent Labs  05/27/15 0223   COLORURINE YELLOW  LABSPEC 1.021  PHURINE 6.0  GLUCOSEU 500*  HGBUR NEGATIVE  BILIRUBINUR NEGATIVE  KETONESUR 15*  PROTEINUR NEGATIVE  UROBILINOGEN 0.2  NITRITE NEGATIVE  LEUKOCYTESUR NEGATIVE       Component Value Date/Time   CHOL 275* 05/27/2015 1136   TRIG 289* 05/27/2015 1136   HDL 40* 05/27/2015 1136   CHOLHDL 6.9 05/27/2015 1136   VLDL 58* 05/27/2015 1136   LDLCALC 177* 05/27/2015 1136   No results found for: HGBA1C    Component Value Date/Time   LABOPIA NONE DETECTED 05/27/2015 0223   COCAINSCRNUR NONE DETECTED 05/27/2015 0223   LABBENZ NONE DETECTED 05/27/2015 0223   AMPHETMU NONE DETECTED 05/27/2015 0223   THCU NONE DETECTED 05/27/2015 0223   LABBARB NONE DETECTED 05/27/2015 0223     Recent Labs Lab 05/26/15 2244  ETH <5    Dg Chest 2 View 05/27/2015    1. Borderline cardiomegaly without failure.  2. Mild interstitial coarsening appears chronic.  3. Degenerative changes as detailed above.     Ct Head Wo Contrast 05/26/2015    1. No acute intracranial pathology seen on CT.  2. Mild to moderate cortical volume loss  and diffuse small vessel ischemic microangiopathy.  3. Scattered chronic lacunar infarcts at the corona radiata bilaterally, and chronic ischemic change at the basal ganglia.  MRI HEAD 05/27/2015 1. Small acute ischemic infarcts involving the cortical white matter of the left precentral gyrus in the posterior left frontal lobe as well as the underlying white matter. No associated hemorrhage or significant mass effect. 2. Moderate generalized cerebral atrophy with chronic microvascular ischemic disease and scattered remote lacunar infarcts.  MRA HEAD  05/27/2015 1. No proximal branch occlusion identified within the intracranial circulation. 2. Atheromatous irregularity within the mid left M1 segment with associated mild stenosis. 3. Atheromatous irregularity within the mid right P2 segment with associated moderate to severe stenoses. 4.  Short-segment moderate stenosis within the right vertebral artery. Left vertebral artery is dominant and widely patent.  2-D echocardiogram 05/27/2015 - Left ventricle: The cavity size was normal. Wall thickness wasincreased in a pattern of moderate LVH. Systolic function wasvigorous. The estimated ejection fraction was in the range of 65%to 70%. Doppler parameters are consistent with abnormal leftventricular relaxation (grade 1 diastolic dysfunction). - Aortic valve: Mildly calcified annulus. Mildly thickened leaflets. Valve area (VTI): 4.15 cm^2. Valve area (Vmax): 3.82 cm^2. - Mitral valve: Mildly calcified annulus. Mildly thickened leaflets - Technically difficult study.  Carotid Ultrasound  There is 1-39% right ICA stenosis, low end of scale. There is 1-39% left ICA stenosis, highest end of scale. Vertebral artery flow is antegrade.   LE venous Doppler  There is no DVT or SVT noted in the bilateral lower extremities.   TEE - No cardiac source of embolism identified. Moderate aortic arch atherosclerosis with mild ulcerative changes, no mobile plaque/thrombus seen.   PHYSICAL EXAM Temp:  [97.8 F (36.6 C)-98.6 F (37 C)] 97.9 F (36.6 C) (06/06 0603) Pulse Rate:  [66-77] 72 (06/06 0603) Resp:  [18-20] 18 (06/06 0603) BP: (122-185)/(66-98) 155/66 mmHg (06/06 0603) SpO2:  [93 %-99 %] 97 % (06/06 0603)  General - Well nourished, well developed, in no apparent distress.  Ophthalmologic - Fundi not visualized due to small pupils.  Cardiovascular - Regular rate and rhythm.  Mental Status -  Level of arousal and orientation to time, place, and person were intact. Language including expression, naming, repetition, comprehension was assessed and found intact. Attention span and concentration were normal. Recent and remote memory were intact. Fund of Knowledge was assessed and was intact.  Cranial Nerves II - XII - II - Visual field intact OU. III, IV, VI - Extraocular  movements intact. V - Facial sensation intact bilaterally. VII - Facial movement intact bilaterally. VIII - Hearing & vestibular intact bilaterally. X - Palate elevates symmetrically. XI - Chin turning & shoulder shrug intact bilaterally. XII - Tongue protrusion intact.  Motor Strength - The patient's strength was normal in all extremities except RUE 4/5 distally with difficulty with dexterity and pronator drift was absent.  Bulk was normal and fasciculations were absent.   Motor Tone - Muscle tone was assessed at the neck and appendages and was normal.  Reflexes - The patient's reflexes were 1+ in all extremities and he had no pathological reflexes.  Sensory - Light touch, temperature/pinprick were assessed and were symmetrical.    Coordination - The patient had right upper extremity ataxia which is proportional to the weakness.  Tremor was absent.  Gait and Station - deferred due to safety concerns.   ASSESSMENT/PLAN Henry Wiggins is a 81 y.o. male with history of diabetes mellitus, hypertension, and hyperlipidemia,  presenting with transient speech difficulties and right upper extremity weakness. He did not receive IV t-PA due to mild deficits.   Strokes:  Dominant left MCA territory small infarcts,  embolic pattern - unknown source.  Resultant RUE weakness with ataxia.  MRI  small acute left MCA infarcts as noted above.  MRA moderate to severe stenosis within the mid right P2 segment  Carotid Doppler No significant stenosis   2D Echo no cardiac source of emboli identified. EF 65-70%.  LDL 177, not at goal  HgbA1c 8.8, not at goal   LE venous Doppler no DVT or SVT noted in the bilateral lower extremities.   TEE no cardiac source of emboli  Loop recorder placed  SCDs for VTE prophylaxis Diet NPO time specified  no antithrombotic prior to admission, now on aspirin 325 mg orally every day.  Patient counseled to be compliant with his antithrombotic  medications  Ongoing aggressive stroke risk factor management  Therapy recommendations:  HH PT  Disposition:  Home with therapies  Ok for discharge for following TEE and possible loop  Follow up Dr. Erlinda Hong 2 mo, stroke clinic, order written.  Hypertension  Home meds: Losartan/hydrochlorothiazide  Blood pressure mildly high.  BP goal normotensive now.  Patient counseled to be compliant with his blood pressure medications  Hyperlipidemia  Home meds:  No lipid lowering medications prior to admission.  LDL 177, goal < 70  Added lipitor 40 mg but wife reports he is allergic, that it has caused hallucinations in the past Dr. Erlinda Hong discussed with wife over the phone this am. She reports he has tried lipitor, simvastatin and crestor in the past without success. Stroke recommends primary care follow up for consideration of PCSK-9, an injection to lower cholesterol.  Continue statin at discharge  Diabetes type II  HgbA1c 8.8, goal < 7.0  Uncontrolled at home as per patient  CBG monitoring  SSI  Management as per primary team  Other Stroke Risk Factors  Advanced age  Cigarette smoker, quit smoking 60 years ago.  Hx stroke/TIA (remote lacunar infarcts by MRI)  Other Active Problems  AKI vs CKD, Cr 1.6->1.12  Other Pertinent History  Patient states he was not taking aspirin on a daily basis prior to admission.  Hospital day # 2  Neurology will sign off. Please call with questions. Pt will follow up with Dr. Erlinda Hong at [facility] in about 2 months. Thanks for the consult.  Rosalin Hawking, MD PhD Stroke Neurology 05/28/2015 4:47 PM  To contact Stroke Continuity provider, please refer to [email or web address] After hours, contact General Neurology

## 2015-05-28 NOTE — Discharge Summary (Signed)
Physician Discharge Summary  Brenndon Mathus Saadeh [id number] DOB: May 11, 1927 DOA: 05/26/2015  PCP: No primary care provider on file.  Admit date: 05/26/2015 Discharge date: 05/28/2015  Time spent: 45 minutes  Recommendations for Outpatient Follow-up:  Patient will be discharged to home with home health physical therapy.  Patient will need to follow up with primary care provider within one week of discharge.  Patient will also need to follow-up with Dr. Erlinda Hong, neurologist, in 2 months.  Patient should continue medications as prescribed.  Patient should follow a heart healthy diet.   Discharge Diagnoses:  Acute CVA Acute kidney injury Diabetes mellitus, type II Hypertension  Discharge Condition: Stable  Diet recommendation: Heart healthy  Filed Weights   05/27/15 0054  Weight: 92 kg (202 lb 13.2 oz)    History of present illness:  on 05/26/2015 by Dr. Jani Gravel  81 yo male with dm2, htn, hyperlipidemia, Was out powerwashing and about 3:30 walking up on porch, had a strange feeling in the left leg. It was not responding to what he wanted it to do. The sensation lasted for about 10 minutes. Pt wife went to get aspirin. And then he ate dinner. And then went to turn television on to watch [employer]. Pt had difficulty speaking, didn't slurr words but difficult to get out. Went to pick up a glass but hand would not work right. Pt 's family call ambulance and pt came to ED for evaluation. CT brain showed bilateral lacunar infarcts new from 2011. EKG showed nsr at 80. Pt notes that bp has been high for ? Years. ED consulted neurology for code stroke. Pt will be admitted for cva.   Hospital Course:  Acute CVA -CT head: No acute intracranial pathology -MRI brain: Small acute ischemic infarcts involving the cortical white matter of the left precentral gyrus, posterior left frontal lobe -Echocardiogram: EF [id number], grade 1 diastolic dysfunction -Carotid doppler: 1-39% right ICA stenosis, 1-39%  left ICA stenosis, vertebral artery flows antegrade -LDL 177 -hemoglobin A1c pending -Lower extremity Doppler showed no DVT or SVT -PT/OT consulted, PT recommended home health  -Neurology consulted and appreciated, pending recommendations -Cardiology consulted for TEE and loop recorder -TEE showed no cardiac source of embolism -Patient received loop recorder implantation -Continue aspirin (patient was not taking aspirin at home) -Patient was placed on statin however per patient and wife, he cannot take statins. Has tried taking Crestor, simvastatin, atorvastatin in the past. Recommended patient see his primary care physician regarding PCSK9 injection   Acute kidney injury  -Renal ultrasound: Bilateral renal cortical thinning with small left renal cyst, no evidence of hydronephrosis -Baseline creatinine approximately 1.2 (in 2011) -Creatinine was 1.6, currently 1.1   Diabetes mellitus, type 2 -Metformin held -Hemoglobin A1c pending -Continue insulin sliding scale CBG monitoring  Hypertension -Hyzaar held -Allow for permissive hypertension -Continue hydralazine PRN  Procedures  Echocardiogram Renal US Lower extremity doppler TEE Loop recorder   Consults  Neurology Cardiology   Discharge Exam: Filed Vitals:   05/28/15 1330  BP: 145/78  Pulse: 79  Temp:   Resp: 21   Exam  General: Well developed, well nourished, No distress  HEENT: NCAT, mucous membranes moist.   Cardiovascular: S1 S2 auscultated, no rubs, murmurs or gallops. Regular rate and rhythm.  Respiratory: Clear to auscultation  Abdomen: Soft, nontender, nondistended, + bowel sounds  Extremities: warm dry without cyanosis clubbing or edema  Neuro: AAOx3, normal sensation and strength  Psych: Appropriate mood and affect  Discharge Instructions  Discharge Instructions    Ambulatory referral to Neurology    Complete by:  As directed   Dr. Erlinda Hong requests followup in 2 months     Discharge  instructions    Complete by:  As directed   Patient will be discharged to home with home health physical therapy.  Patient will need to follow up with primary care provider within one week of discharge.  Patient will also need to follow-up with Dr. Erlinda Hong, neurologist, in 2 months.  Patient should continue medications as prescribed.  Patient should follow a heart healthy diet.  Hold Hyzaar for one week. Restart on 06/02/2015.            Medication List    TAKE these medications        aspirin 325 MG tablet  Take 325 mg by mouth once.     aspirin 325 MG tablet  Take 1 tablet (325 mg total) by mouth daily.     Cholecalciferol 1000 UNITS tablet  Take 1,000 Units by mouth daily.     EYE VITAMINS & MINERALS PO  Take 1 tablet by mouth daily.     losartan-hydrochlorothiazide 100-25 MG per tablet  Commonly known as:  HYZAAR  Take 1 tablet by mouth daily. Hold for 1 week.     metFORMIN 1000 MG tablet  Commonly known as:  GLUCOPHAGE  Take 1 tablet by mouth 2 (two) times daily.     vitamin C 500 MG tablet  Commonly known as:  ASCORBIC ACID  Take 500 mg by mouth daily.     vitamin E 400 UNIT capsule  Take 400 Units by mouth daily.     ZINC PO  Take 1 tablet by mouth every other day.       No Known Allergies Follow-up Information    Follow up with Xu,Jindong, MD.   Specialty:  Neurology   Why:  Stroke Clinic, Office will call you with appointment date & time   Contact information:   [address] [place] [address] [phone]       Follow up with Primary care physician. Schedule an appointment as soon as possible for a visit in 1 week.   Why:  Hospital follow up.  Discuss blood pressure and cholesterol.       The results of significant diagnostics from this hospitalization (including imaging, microbiology, ancillary and laboratory) are listed below for reference.    Significant Diagnostic Studies: Dg Chest 2 View  05/27/2015   CLINICAL DATA:   Stroke.  Diabetes and hypertension.  EXAM: CHEST - 2 VIEW  COMPARISON:  Two-view chest x-ray 07/04/2010  FINDINGS: Mild cardiac enlargement is exaggerated by low lung volumes. Vascular calcifications are present at the aortic arch. Interstitial coarsening appears chronic. No focal airspace consolidation is present. There is no edema or effusion to suggest failure. Fusion of anterior osteophytes is noted across multiple levels in the thoracic spine. Healed right-sided rib fractures are again noted. Degenerative changes are present at shoulders.  IMPRESSION: 1. Borderline cardiomegaly without failure. 2. Mild interstitial coarsening appears chronic. 3. Degenerative changes as detailed above.   Electronically Signed   By: San Morelle M.D.   On: 05/27/2015 07:38   Ct Head Wo Contrast  05/26/2015   CLINICAL DATA:  Code stroke. Right upper extremity weakness and slurred speech. Initial encounter.  EXAM: CT HEAD WITHOUT CONTRAST  TECHNIQUE: Contiguous axial images were obtained from the base of the skull through the vertex without intravenous contrast.  COMPARISON:  MRI of the brain performed 10/12/2007, and CT of the head performed 07/30/2010  FINDINGS: There is no evidence of acute infarction, mass lesion, or intra- or extra-axial hemorrhage on CT.  Prominence of the ventricles and sulci reflects mild to moderate cortical volume loss. Cerebellar atrophy is noted. Diffuse periventricular and subcortical white matter change likely reflects small vessel ischemic microangiopathy. Mild chronic ischemic change is noted at the basal ganglia bilaterally. Scattered chronic lacunar infarcts are noted at the corona radiata bilaterally.  The brainstem and fourth ventricle are within normal limits. The cerebral hemispheres demonstrate grossly normal gray-white differentiation. No mass effect or midline shift is seen.  There is no evidence of fracture; visualized osseous structures are unremarkable in appearance. The orbits  are within normal limits. The paranasal sinuses and mastoid air cells are well-aerated. No significant soft tissue abnormalities are seen.  IMPRESSION: 1. No acute intracranial pathology seen on CT. 2. Mild to moderate cortical volume loss and diffuse small vessel ischemic microangiopathy. 3. Scattered chronic lacunar infarcts at the corona radiata bilaterally, and chronic ischemic change at the basal ganglia.  These results were called by telephone at the time of interpretation on 05/26/2015 at 11:01 pm to Dr. Leonel Ramsay, who verbally acknowledged these results.   Electronically Signed   By: Garald Balding M.D.   On: 05/26/2015 23:01   Mr Brain Wo Contrast  05/27/2015   CLINICAL DATA:  Initial evaluation for acute right arm and leg weakness with speech difficulty. Speech now improved, but with persistent right arm weakness.  EXAM: MRI HEAD WITHOUT CONTRAST  MRA HEAD WITHOUT CONTRAST  TECHNIQUE: Multiplanar, multiecho pulse sequences of the brain and surrounding structures were obtained without intravenous contrast. Angiographic images of the head were obtained using MRA technique without contrast.  COMPARISON:  Prior CT from 05/26/2015.  FINDINGS: MRI HEAD FINDINGS  Diffuse prominence of the CSF containing spaces is compatible with generalized cerebral atrophy. Patchy and confluent T2/FLAIR hyperintensity present within the periventricular and deep white matter of both cerebral hemispheres most compatible with moderate chronic small vessel ischemic disease. Few scattered subcentimeter remote lacunar infarcts present within the bilateral basal ganglia, right greater than left. Additional small chronic infarcts involving the anterior genu and posterior body of the corpus callosum, best seen on sagittal T1 weighted sequence (series 5, image 13). Suspected small remote infarct within the posterior right temporal lobe as well with associated chronic blood products (series 9, image 41). Small chronic micro hemorrhage  noted within the left parietal lobe as well.  There are two adjacent small acute ischemic infarcts involving the cortical gray matter of the precentral gyrus in the posterior left frontal lobe, the largest of which measures 8 mm (series 3, images 39-42). Additional 1 cm linear ischemic infarct within the underlying subcortical and deep white matter. No significant mass effect. No associated hemorrhage. No other infarct identified. Scattered hyperintense signal foci seen more inferiorly within the left parietal lobe on coronal DWI sequence favored to be artifactual in nature with no corresponding signal seen on axial sequence (series 8, image 6).  No mass lesion, mass effect, or midline shift. Mild ventricular prominence related to global parenchymal volume loss present without hydrocephalus. No extra-axial fluid collection.  Craniocervical junction widely patent and normal in appearance. Pituitary gland unremarkable. No acute abnormality about the orbits. Sequela of prior bilateral lens extraction noted.  Paranasal sinuses are clear. Trace fluid noted within the left mastoid air cells. Mastoid air cells are otherwise clear. Inner ear structures  normal.  Mild degenerative changes noted within the upper cervical spine. Bone marrow signal intensity normal. Scalp soft tissues unremarkable.  MRA HEAD FINDINGS  ANTERIOR CIRCULATION:  The visualized portions of the distal cervical segments of the internal carotid arteries are widely patent with antegrade flow. The petrous segments are widely patent. Multi focal atheromatous irregularity present within the cavernous segments of the internal carotid arteries bilaterally, left greater than right. Associated mild narrowing without high-grade stenosis. Supraclinoid segments widely patent. A1 segments, anterior communicating artery common anterior cerebral artery well opacified.  Multi focal atheromatous irregularity within the left M1 segment with secondary mild stenosis of the  mid left M1 segment (series 405, image 9). Right M1 segment demonstrates mild irregularity but widely patent without stenosis. Mild distal branch irregularity within the MCA branches bilaterally.  POSTERIOR CIRCULATION:  Vertebral arteries are patent to the vertebrobasilar junction. Small focal moderate stenosis within the right vertebral artery prior to the origin of the right PICA. The left vertebral artery is dominant. Posterior inferior cerebellar arteries patent bilaterally. Anterior inferior cerebellar arteries well opacified. Basilar artery demonstrates mild irregularity without hemodynamically significant stenosis. Superior cerebellar arteries patent proximally. Posterior cerebral arteries patent bilaterally. There is focal moderate to severe stenoses within the mid and distal right P2 segment.  No aneurysm.  IMPRESSION: MRI HEAD IMPRESSION:  1. Small acute ischemic infarcts involving the cortical white matter of the left precentral gyrus in the posterior left frontal lobe as well as the underlying white matter. No associated hemorrhage or significant mass effect. 2. Moderate generalized cerebral atrophy with chronic microvascular ischemic disease and scattered remote lacunar infarcts as detailed above.  MRA HEAD IMPRESSION:  1. No proximal branch occlusion identified within the intracranial circulation. 2. Atheromatous irregularity within the mid left M1 segment with associated mild stenosis. 3. Atheromatous irregularity within the mid right P2 segment with associated moderate to severe stenoses. 4. Short-segment moderate stenosis within the right vertebral artery. Left vertebral artery is dominant and widely patent.   Electronically Signed   By: Jeannine Boga M.D.   On: 05/27/2015 09:40   US Renal  05/27/2015   CLINICAL DATA:  Hypertension, acute renal failure, diabetes, stroke, bladder cancer, former smoker  EXAM: RENAL / URINARY TRACT ULTRASOUND COMPLETE  COMPARISON:  CT abdomen and pelvis  02/14/2014  FINDINGS: Right Kidney:  Length: 11.1 cm. Cortical thinning. Normal cortical echogenicity. No mass, hydronephrosis or shadowing calcification.  Left Kidney:  Length: 12.2 cm. Cortical thinning. Normal cortical echogenicity. Small cyst upper pole 14 x 13 x 17 mm. Additional small cyst upper pole 12 x 12 x 12 mm. No solid mass, hydronephrosis or shadowing calcification.  Bladder:  Normally distended. Question nodular density posterior bladder base, 17 x 14 x 17 mm, could represent a bladder tumor or extension of central lobe of prostate gland.  IMPRESSION: BILATERAL renal cortical thinning with small LEFT renal cyst.  No evidence of hydronephrosis.  17 x 14 x 17 mm diameter nodular focus at posterior bladder base, question bladder tumor versus prominent central lobe of prostate.   Electronically Signed   By: Lavonia Dana M.D.   On: 05/27/2015 10:26   Mr Jodene Nam Head/brain Wo Cm  05/27/2015   CLINICAL DATA:  Initial evaluation for acute right arm and leg weakness with speech difficulty. Speech now improved, but with persistent right arm weakness.  EXAM: MRI HEAD WITHOUT CONTRAST  MRA HEAD WITHOUT CONTRAST  TECHNIQUE: Multiplanar, multiecho pulse sequences of the brain and surrounding structures were obtained without  intravenous contrast. Angiographic images of the head were obtained using MRA technique without contrast.  COMPARISON:  Prior CT from 05/26/2015.  FINDINGS: MRI HEAD FINDINGS  Diffuse prominence of the CSF containing spaces is compatible with generalized cerebral atrophy. Patchy and confluent T2/FLAIR hyperintensity present within the periventricular and deep white matter of both cerebral hemispheres most compatible with moderate chronic small vessel ischemic disease. Few scattered subcentimeter remote lacunar infarcts present within the bilateral basal ganglia, right greater than left. Additional small chronic infarcts involving the anterior genu and posterior body of the corpus callosum, best  seen on sagittal T1 weighted sequence (series 5, image 13). Suspected small remote infarct within the posterior right temporal lobe as well with associated chronic blood products (series 9, image 41). Small chronic micro hemorrhage noted within the left parietal lobe as well.  There are two adjacent small acute ischemic infarcts involving the cortical gray matter of the precentral gyrus in the posterior left frontal lobe, the largest of which measures 8 mm (series 3, images 39-42). Additional 1 cm linear ischemic infarct within the underlying subcortical and deep white matter. No significant mass effect. No associated hemorrhage. No other infarct identified. Scattered hyperintense signal foci seen more inferiorly within the left parietal lobe on coronal DWI sequence favored to be artifactual in nature with no corresponding signal seen on axial sequence (series 8, image 6).  No mass lesion, mass effect, or midline shift. Mild ventricular prominence related to global parenchymal volume loss present without hydrocephalus. No extra-axial fluid collection.  Craniocervical junction widely patent and normal in appearance. Pituitary gland unremarkable. No acute abnormality about the orbits. Sequela of prior bilateral lens extraction noted.  Paranasal sinuses are clear. Trace fluid noted within the left mastoid air cells. Mastoid air cells are otherwise clear. Inner ear structures normal.  Mild degenerative changes noted within the upper cervical spine. Bone marrow signal intensity normal. Scalp soft tissues unremarkable.  MRA HEAD FINDINGS  ANTERIOR CIRCULATION:  The visualized portions of the distal cervical segments of the internal carotid arteries are widely patent with antegrade flow. The petrous segments are widely patent. Multi focal atheromatous irregularity present within the cavernous segments of the internal carotid arteries bilaterally, left greater than right. Associated mild narrowing without high-grade  stenosis. Supraclinoid segments widely patent. A1 segments, anterior communicating artery common anterior cerebral artery well opacified.  Multi focal atheromatous irregularity within the left M1 segment with secondary mild stenosis of the mid left M1 segment (series 405, image 9). Right M1 segment demonstrates mild irregularity but widely patent without stenosis. Mild distal branch irregularity within the MCA branches bilaterally.  POSTERIOR CIRCULATION:  Vertebral arteries are patent to the vertebrobasilar junction. Small focal moderate stenosis within the right vertebral artery prior to the origin of the right PICA. The left vertebral artery is dominant. Posterior inferior cerebellar arteries patent bilaterally. Anterior inferior cerebellar arteries well opacified. Basilar artery demonstrates mild irregularity without hemodynamically significant stenosis. Superior cerebellar arteries patent proximally. Posterior cerebral arteries patent bilaterally. There is focal moderate to severe stenoses within the mid and distal right P2 segment.  No aneurysm.  IMPRESSION: MRI HEAD IMPRESSION:  1. Small acute ischemic infarcts involving the cortical white matter of the left precentral gyrus in the posterior left frontal lobe as well as the underlying white matter. No associated hemorrhage or significant mass effect. 2. Moderate generalized cerebral atrophy with chronic microvascular ischemic disease and scattered remote lacunar infarcts as detailed above.  MRA HEAD IMPRESSION:  1. No proximal branch occlusion  identified within the intracranial circulation. 2. Atheromatous irregularity within the mid left M1 segment with associated mild stenosis. 3. Atheromatous irregularity within the mid right P2 segment with associated moderate to severe stenoses. 4. Short-segment moderate stenosis within the right vertebral artery. Left vertebral artery is dominant and widely patent.   Electronically Signed   By: Jeannine Boga M.D.    On: 05/27/2015 09:40    Microbiology: No results found for this or any previous visit (from the past 240 hour(s)).   Labs: Basic Metabolic Panel:  Recent Labs Lab 05/26/15 2244 05/26/15 2301 05/27/15 1136 05/28/15 0555  NA 139 139 135 137  K 4.1 4.2 4.1 4.5  CL 100* 102 99* 102  CO2 25  --  26 24  GLUCOSE 258* 264* 273* 187*  BUN 28* 31* 22* 25*  CREATININE 1.66* 1.60* 1.14 1.12  CALCIUM 9.5  --  9.3 9.0   Liver Function Tests:  Recent Labs Lab 05/26/15 2244 05/27/15 1136  AST 39 33  ALT 30 28  ALKPHOS 63 69  BILITOT 0.5 0.7  PROT 6.2* 6.6  ALBUMIN 3.5 3.6   No results for input(s): LIPASE, AMYLASE in the last 168 hours. No results for input(s): AMMONIA in the last 168 hours. CBC:  Recent Labs Lab 05/26/15 2244 05/26/15 2301 05/27/15 1136 05/28/15 0555  WBC 7.2  --  6.3 7.2  NEUTROABS 4.4  --   --   --   HGB 13.3 13.6 13.5 13.2  HCT 39.2 40.0 40.0 39.7  MCV 90.3  --  90.5 90.2  PLT 186  --  183 169   Cardiac Enzymes: No results for input(s): CKTOTAL, CKMB, CKMBINDEX, TROPONINI in the last 168 hours. BNP: BNP (last 3 results) No results for input(s): BNP in the last 8760 hours.  ProBNP (last 3 results) No results for input(s): PROBNP in the last 8760 hours.  CBG:  Recent Labs Lab 05/27/15 1116 05/27/15 1604 05/27/15 2243 05/28/15 0623 05/28/15 1142  GLUCAP 286* 251* 224* 185* 188*       Signed:  Azula Zappia  [facility] 05/28/2015, 2:22 PM

## 2015-05-28 NOTE — Op Note (Signed)
INDICATIONS: cryptogenic stroke  PROCEDURE:   Informed consent was obtained prior to the procedure. The risks, benefits and alternatives for the procedure were discussed and the patient comprehended these risks.  Risks include, but are not limited to, cough, sore throat, vomiting, nausea, somnolence, esophageal and stomach trauma or perforation, bleeding, low blood pressure, aspiration, pneumonia, infection, trauma to the teeth and death.    After a procedural time-out, the oropharynx was anesthetized with 20% benzocaine spray. The patient was given 3 mg versed and 25 mcg fentanyl for moderate sedation.   The transesophageal probe was inserted in the esophagus and stomach without difficulty and multiple views were obtained.  The patient was kept under observation until the patient left the procedure room.  The patient left the procedure room in stable condition.   Agitated microbubble saline contrast was administered.  COMPLICATIONS:    There were no immediate complications.  FINDINGS:  No cardiac source of embolism identified. Moderate aortic arch atherosclerosis with mild ulcerative changes, no mobile plaque/thrombus seen.  RECOMMENDATIONS:   Loop recorder implantation.  Time Spent Directly with the Patient:  30 minutes   Henry Wiggins 05/28/2015, 12:58 PM

## 2015-05-28 NOTE — Evaluation (Signed)
Speech Language Pathology Evaluation Patient Details Name: Henry Wiggins MRN: [id number] DOB: October 15, 1927 Today's Date: 05/28/2015 Time: 1530-1550 SLP Time Calculation (min) (ACUTE ONLY): 20 min  Problem List:  Patient Active Problem List   Diagnosis Date Noted  . Stroke 05/27/2015  . CVA (cerebral infarction) 05/26/2015  . Diabetes 05/26/2015  . Hypertension 05/26/2015  . Hyperlipidemia 05/26/2015  . Acute renal failure 05/26/2015   Past Medical History:  Past Medical History  Diagnosis Date  . Diabetes   . Hypertension   . Hyperlipidemia   . UTI (lower urinary tract infection)   . Bladder cancer     Dr. Janice Norrie   Past Surgical History:  Past Surgical History  Procedure Laterality Date  . Cholecystectomy    . Hernia repair      x4  . Carpal tunnel release Right   . Appendectomy    . Cataract extraction Bilateral   . Tonsillectomy     HPI:  81 yo male with dm2, htn, hyperlipidemia, Was out powerwashing and about 3:30 walking up on porch, had a strange feeling in the left leg. It was not responding to what he wanted it to do. The sensation lasted for about 10 minutes. Pt wife went to get aspirin. And then he ate dinner. And then went to turn television on to watch [employer]. Pt had difficulty speaking, didn't slurr words but difficult to get out. Went to pick up a glass but hand would not work right. Pt 's family call ambulance and pt came to ED for evaluation. CT brain showed bilateral lacunar infarcts new from 2011. V   Assessment / Plan / Recommendation Clinical Impression  Cognitive-linguistic evaluation complete. Patient presents with mild cognitive impairements, primarily impacting short term recall of information, which family states is baseline as well as mild residuals expressive aphasia with intermittent word finding deficits. Plans are for discharge today. Recommend [facility] SLP after d/c. Note that patient has not yet had OT evaluation complete and right had  weakness is present and patient's biggest complaint at this time. If evaluation cannot be completed today, recommend [facility] OT evaluation after d/c as well.     SLP Assessment  All further Speech Lanaguage Pathology  needs can be addressed in the next venue of care    Follow Up Recommendations  Home health SLP       Pertinent Vitals/Pain Pain Assessment: No/denies pain   SLP Goals     SLP Evaluation Prior Functioning  Cognitive/Linguistic Baseline: Baseline deficits Baseline deficit details: short term memory Type of Home: House  Lives With: Spouse Available Help at Discharge: Family   Cognition  Overall Cognitive Status: Impaired/Different from baseline (although difficult to assess given baseline memory impaired) Arousal/Alertness: Awake/alert Orientation Level: Oriented X4 Attention: Sustained Sustained Attention: Appears intact Memory: Impaired Memory Impairment: Storage deficit;Retrieval deficit;Decreased short term memory Decreased Short Term Memory: Verbal basic;Functional basic Awareness: Appears intact Problem Solving: Appears intact Safety/Judgment: Appears intact    Comprehension  Auditory Comprehension Overall Auditory Comprehension: Appears within functional limits for tasks assessed Visual Recognition/Discrimination Discrimination: Not tested Reading Comprehension Reading Status: Not tested    Expression Expression Primary Mode of Expression: Verbal Verbal Expression Overall Verbal Expression: Impaired Initiation: No impairment Automatic Speech: Name Level of Generative/Spontaneous Verbalization: Conversation Repetition: No impairment Naming: Impairment Responsive: Not tested Confrontation: Impaired Convergent: 75-100% accurate Other Naming Comments: note semantic paraphasias in conversation Verbal Errors: Semantic paraphasias Pragmatics: No impairment   Oral / Motor Oral Motor/Sensory Function Overall Oral Motor/Sensory Function:  Appears within  functional limits for tasks assessed Motor Speech Overall Motor Speech: Appears within functional limits for tasks assessed   [place], CCC-SLP [phone]   Brealynn Contino Meryl 05/28/2015, 4:00 PM

## 2015-05-29 ENCOUNTER — Encounter (HOSPITAL_COMMUNITY): Payer: Self-pay | Admitting: Cardiovascular Disease

## 2015-06-01 ENCOUNTER — Telehealth: Payer: Self-pay | Admitting: Neurology

## 2015-06-01 NOTE — Telephone Encounter (Signed)
Pt called has appt in August and wants to know when he can drive please call dg

## 2015-06-04 NOTE — Telephone Encounter (Signed)
He has never been seen here before - called him and moved his appt to an earlier date.

## 2015-06-06 ENCOUNTER — Ambulatory Visit (INDEPENDENT_AMBULATORY_CARE_PROVIDER_SITE_OTHER): Payer: Medicare Other | Admitting: Neurology

## 2015-06-06 ENCOUNTER — Encounter: Payer: Self-pay | Admitting: Neurology

## 2015-06-06 VITALS — BP 172/84 | HR 71 | Ht 69.0 in | Wt 203.0 lb

## 2015-06-06 DIAGNOSIS — G4733 Obstructive sleep apnea (adult) (pediatric): Secondary | ICD-10-CM | POA: Diagnosis not present

## 2015-06-06 DIAGNOSIS — I639 Cerebral infarction, unspecified: Secondary | ICD-10-CM | POA: Diagnosis not present

## 2015-06-06 NOTE — Progress Notes (Signed)
PATIENT: Henry Wiggins DOB: [birth year]  Chief Complaint  Patient presents with  . Cerebrovascular Accident    MMSE 29/30 - 8 animals. He is here with his wife, Constance Holster.  He is here for a hospital follow up from having a stroke on 05/26/15.  He is currently in home physical therapy and doing well, with the exception of right arm/hand weakness.    HISTORICAL (Initial visit June 06 2015)  Henry Wiggins is 81 years old right-handed male, accompanied by his wife, seeing referring by his primary care physician Dr. Antony Contras for evaluation of stroke  He had a history of hypertension, diabetes since 2011, was taking aspirin occasionally, also had a history of obesity  In June fourth 2016, he had son onset dysarthria, lasting for few minutes,later also noticed right hand weakness.but denies right leg weakness, denied gait difficulty.  He presented to the emergency room, I have reviewed MRI of the brain in May 28 2015 with patient, moderate atrophy, moderate periventricular small vessel disease,scattered lacunar infarctions, small acute ischemic infarction involving the cortical white matter of the left precentral gyrus in the posterior left frontal lobe, as well as deep white matter,  MRA of the brain showed intracranial cranial atherosclerotic disease, no significant large vessel disease.  Ultrasound of carotid artery showed 1-39% bilateral internal carotid artery stenosis, mild plaque, anterograde flow bilateral vertebral arteries. TEE May 28 2015: mild concentric hypertrophy. Systolic function was normal. The estimated ejection fraction was in the range of 55% to 60%. Wall motion was normal; there were no cardiac embolic source identified.  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 aspirin 325 MG tablet Take 325 mg by mouth once.    . Cholecalciferol 1000 UNITS tablet Take 1,000 Units by mouth daily.    Marland Kitchen losartan-hydrochlorothiazide (HYZAAR) 100-25 MG per tablet Take 1 tablet by mouth daily. Hold for 1 week.    . metFORMIN (GLUCOPHAGE) 1000 MG tablet Take 1 tablet by mouth 2 (two) times daily.    . Multiple Vitamins-Minerals (EYE VITAMINS & MINERALS PO) Take 1 tablet by mouth daily.    . Multiple Vitamins-Minerals (ZINC PO) Take 1 tablet by mouth every other day.    . vitamin C (ASCORBIC ACID) 500 MG tablet Take 500 mg by mouth daily.    . vitamin E 400 UNIT capsule Take 400 Units by mouth daily.       PAST MEDICAL HISTORY: Past Medical History  Diagnosis Date  . Diabetes   . Hypertension   . Hyperlipidemia   . UTI (lower urinary tract infection)   . Bladder cancer     Dr. Janice Norrie  . Stroke     PAST SURGICAL HISTORY: Past Surgical History  Procedure Laterality Date  . Cholecystectomy    .  Hernia repair      x4  . Carpal tunnel release Right   . Appendectomy    . Cataract extraction Bilateral   . Tonsillectomy    . Tee without cardioversion N/A 05/28/2015    Procedure: TRANSESOPHAGEAL ECHOCARDIOGRAM (TEE);  Surgeon: Sanda Klein, MD;  Location: [place];  Service: Cardiovascular;  Laterality: N/A;  . Ep implantable device N/A 05/28/2015    Procedure: Loop Recorder Insertion;  Surgeon: Sanda Klein, MD;  Location: [place] [facility];  Service: Cardiovascular;  Laterality: N/A;    FAMILY HISTORY: Family History  Problem Relation Age of Onset  . Hypertension Mother   . Hypertension Father     SOCIAL HISTORY:  History   Social History  . Marital Status: Married    Spouse Name: N/A  . Number of Children: 5  . Years of Education: 10   Occupational History  . Retired    Social History Main Topics  . Smoking status: Former Smoker -- 0.50 packs/day for 5 years    Types: Cigarettes    Quit date: 12/22/1953  . Smokeless tobacco: Not on file  . Alcohol Use: No  . Drug Use: No  . Sexual Activity: Not on file   Other Topics Concern  . Not on file   Social History Narrative   Lives at home with his wife.   Right-handed.   2 cups caffeine per day.     PHYSICAL EXAM   Filed Vitals:   06/06/15 1355  BP: 172/84  Pulse: 71  Height: 5\' 9"  (1.753 m)  Weight: 203 lb (92.08 kg)    Not recorded      Body mass index is 29.96 kg/(m^2).  PHYSICAL EXAMNIATION:  Gen: NAD, conversant, well nourised, obese, well groomed                     Cardiovascular: Regular rate rhythm, no peripheral edema, warm, nontender. Eyes: Conjunctivae clear without exudates or hemorrhage Neck: Supple, no carotid bruise. Pulmonary: Clear to auscultation bilaterally   NEUROLOGICAL EXAM:  MENTAL STATUS: Speech:    Speech is normal; fluent and spontaneous with normal comprehension.  Cognition:Mini-Mental Status Examination 29 out of 30, he missed one out of 3 recalls.    The patient is oriented to person, place, and time;     language fluent;     normal attention, concentration,     fund of knowledge.  CRANIAL NERVES: CN II: Visual fields are full to confrontation. Fundoscopic exam is normal with sharp discs and no vascular changes. Pupils were equal round reactive to light. CN III, IV, VI: extraocular movement are normal. No ptosis. CN V: Facial sensation is intact to pinprick  in all 3 divisions bilaterally. Corneal responses are intact.  CN VII: Face is symmetric with normal eye closure and smile. CN VIII: Hearing is normal to rubbing fingers CN IX, X: Palate elevates symmetrically. Phonation is normal. CN XI: Head turning and shoulder shrug are intact CN XII: Tongue is midline with normal movements and no atrophy.  MOTOR: There is no pronator drift of out-stretched arms. Muscle bulk and tone are normal. Muscle strength is normal.  REFLEXES: Reflexes are 2+ and symmetric at the biceps, triceps, knees, and ankles. Plantar responses are flexor.  SENSORY: Light touch, pinprick, position sense, and vibration sense are intact in fingers and toes.  COORDINATION: Rapid alternating movements and fine finger movements are intact. There is no dysmetria on finger-to-nose and heel-knee-shin. There are no abnormal or  extraneous movements.   GAIT/STANCE: Posture is normal. Gait is steady with normal steps, base, arm swing, and turning. Heel and toe walking are normal. Mild difficulty with tandem walking Romberg is absent.   DIAGNOSTIC DATA (LABS, IMAGING, TESTING) - I reviewed patient records, labs, notes, testing and imaging myself where available.  Lab Results  Component Value Date   WBC 7.2 05/28/2015   HGB 13.2 05/28/2015   HCT 39.7 05/28/2015   MCV 90.2 05/28/2015   PLT 169 05/28/2015      Component Value Date/Time   NA 137 05/28/2015 0555   K 4.5 05/28/2015 0555   CL 102 05/28/2015 0555   CO2 24 05/28/2015 0555   GLUCOSE 187* 05/28/2015 0555   BUN 25* 05/28/2015 0555   CREATININE 1.12 05/28/2015 0555   CALCIUM 9.0 05/28/2015 0555   PROT 6.6 05/27/2015 1136   ALBUMIN 3.6 05/27/2015 1136   AST 33 05/27/2015 1136   ALT 28 05/27/2015 1136   ALKPHOS 69 05/27/2015 1136   BILITOT 0.7 05/27/2015 1136   GFRNONAA 58* 05/28/2015 0555   GFRAA >60 05/28/2015 0555   Lab Results  Component Value Date   CHOL 275* 05/27/2015   HDL 40* 05/27/2015   LDLCALC  177* 05/27/2015   TRIG 289* 05/27/2015   CHOLHDL 6.9 05/27/2015   Lab Results  Component Value Date   HGBA1C 8.8* 05/27/2015   Lab Results  Component Value Date   VITAMINB12 519 05/27/2015   Lab Results  Component Value Date   TSH 2.518 05/27/2015      ASSESSMENT AND PLAN  Breylon H Merkle is a 81 y.o. male with vascular risk factor of hypertension, diabetes, poorly controlled, with A1c 8.8,  1, multiple left hemisphere deep white matter stroke, embolic stroke,possible arterial to arterial embolic, keep aspirin [id number] mg daily. 2. Obstructive sleep apnea, referral to sleep study 3. He also had loop recorder, cardiology evaluation 4. I encouraged moderate exercise, weight loss, contact primary care physician for better diabetes control, keep well hydration 5.Return to clinic in 3 months   Marcial Pacas, M.D. Ph.D.  [facility] [address] [place], [place] [address] Ph: [phone] Fax: [phone]

## 2015-06-07 ENCOUNTER — Ambulatory Visit (INDEPENDENT_AMBULATORY_CARE_PROVIDER_SITE_OTHER): Payer: Medicare Other | Admitting: *Deleted

## 2015-06-07 DIAGNOSIS — I639 Cerebral infarction, unspecified: Secondary | ICD-10-CM

## 2015-06-07 LAB — CUP PACEART INCLINIC DEVICE CHECK: Date Time Interrogation Session: 20160616113500

## 2015-06-07 NOTE — Progress Notes (Signed)
ILR Wound check appointment. Steri-strips removed. Wound without redness or edema. Incision edges approximated, wound well healed. Pt with 0 tachy episodes; 0 brady episodes; 0 asystole. Carelink summary reports QMO & ROV w/ [facility] 09/05/15.

## 2015-06-19 ENCOUNTER — Encounter: Payer: Self-pay | Admitting: Cardiology

## 2015-06-20 ENCOUNTER — Encounter: Payer: Self-pay | Admitting: Cardiovascular Disease

## 2015-06-27 ENCOUNTER — Ambulatory Visit (INDEPENDENT_AMBULATORY_CARE_PROVIDER_SITE_OTHER): Payer: Medicare Other | Admitting: *Deleted

## 2015-06-27 DIAGNOSIS — I639 Cerebral infarction, unspecified: Secondary | ICD-10-CM | POA: Diagnosis not present

## 2015-06-27 NOTE — Progress Notes (Signed)
Loop recorder 

## 2015-06-29 ENCOUNTER — Telehealth: Payer: Self-pay | Admitting: Cardiovascular Disease

## 2015-06-29 LAB — CUP PACEART REMOTE DEVICE CHECK: Date Time Interrogation Session: 20160708105139

## 2015-06-29 NOTE — Telephone Encounter (Signed)
Informed pt wife that we are receiving transmission now. Pt wife verbalized understanding.

## 2015-06-29 NOTE — Telephone Encounter (Signed)
New message     Pt has a loop recorder.  They received a letter stating they had not received a transmission.  The box was accidentally unplugged.  They plugged it up.  Have you received a transmission?

## 2015-07-12 ENCOUNTER — Encounter: Payer: Self-pay | Admitting: Cardiovascular Disease

## 2015-07-27 ENCOUNTER — Ambulatory Visit (INDEPENDENT_AMBULATORY_CARE_PROVIDER_SITE_OTHER): Payer: Medicare Other | Admitting: *Deleted

## 2015-07-27 DIAGNOSIS — I639 Cerebral infarction, unspecified: Secondary | ICD-10-CM

## 2015-07-27 NOTE — Progress Notes (Signed)
Loop recorder 

## 2015-07-31 LAB — CUP PACEART REMOTE DEVICE CHECK: Date Time Interrogation Session: 20160809152216

## 2015-08-06 ENCOUNTER — Telehealth: Payer: Self-pay

## 2015-08-06 NOTE — Telephone Encounter (Signed)
I spoke to the patient's spouse, Constance Holster, about the Respect-ESUS study. She wanted to know more information about the study. She will communicate this information to her husband and will call me back.

## 2015-08-08 ENCOUNTER — Ambulatory Visit: Payer: Medicare Other | Admitting: Neurology

## 2015-08-08 ENCOUNTER — Telehealth: Payer: Self-pay

## 2015-08-08 NOTE — Telephone Encounter (Signed)
I spoke to the patient in re the Respect-Esus trial. The patient stated that he is not interested at this moment in participating. I thanked him for his time and consideration.

## 2015-08-24 ENCOUNTER — Ambulatory Visit (INDEPENDENT_AMBULATORY_CARE_PROVIDER_SITE_OTHER): Payer: Medicare Other | Admitting: *Deleted

## 2015-08-24 DIAGNOSIS — I639 Cerebral infarction, unspecified: Secondary | ICD-10-CM

## 2015-08-29 NOTE — Progress Notes (Signed)
Loop recorder 

## 2015-09-03 ENCOUNTER — Encounter: Payer: Self-pay | Admitting: Cardiovascular Disease

## 2015-09-05 ENCOUNTER — Ambulatory Visit (INDEPENDENT_AMBULATORY_CARE_PROVIDER_SITE_OTHER): Payer: Medicare Other | Admitting: Cardiovascular Disease

## 2015-09-05 ENCOUNTER — Encounter: Payer: Self-pay | Admitting: Cardiovascular Disease

## 2015-09-05 VITALS — BP 138/81 | HR 73 | Resp 15 | Ht 69.0 in | Wt 200.4 lb

## 2015-09-05 DIAGNOSIS — I639 Cerebral infarction, unspecified: Secondary | ICD-10-CM | POA: Diagnosis not present

## 2015-09-05 DIAGNOSIS — Z4509 Encounter for adjustment and management of other cardiac device: Secondary | ICD-10-CM

## 2015-09-05 NOTE — Progress Notes (Signed)
Patient ID: Henry Wiggins, male   DOB: [birth year], 81 y.o.   MRN: [id number]      Cardiology Office Note   Date:  09/05/2015   ID:  Henry Wiggins, DOB 1927/06/14, MRN [id number]  PCP:  Gara Kroner, MD  Cardiologist:   Sanda Klein, MD   Chief Complaint  Patient presents with  . Follow-up    3 months, pt denied chest pain and SOB      History of Present Illness: Henry Wiggins is a 81 y.o. male who presents for  Loop recorder interrogation. This is his first office appointment after the device was implanted for cryptogenic stroke about 3 months ago. He seems to have recovered almost all his neurological deficits with the exception of some residual right arm weakness/clumsiness. He has not had problems with palpitations and denies syncope, angina, dyspnea or lower extremity edema. The loop recorder site is not giving him any problems and it has healed nicely. On pharmacological therapy for hypertension and hyperlipidemia.    Past Medical History  Diagnosis Date  . Diabetes   . Hypertension   . Hyperlipidemia   . UTI (lower urinary tract infection)   . Bladder cancer     Dr. Janice Norrie  . Stroke     Past Surgical History  Procedure Laterality Date  . Cholecystectomy    . Hernia repair      x4  . Carpal tunnel release Right   . Appendectomy    . Cataract extraction Bilateral   . Tonsillectomy    . Tee without cardioversion N/A 05/28/2015    Procedure: TRANSESOPHAGEAL ECHOCARDIOGRAM (TEE);  Surgeon: Sanda Klein, MD;  Location: [place];  Service: Cardiovascular;  Laterality: N/A;  . Ep implantable device N/A 05/28/2015    Procedure: Loop Recorder Insertion;  Surgeon: Sanda Klein, MD;  Location: [place] [facility];  Service: Cardiovascular;  Laterality: N/A;     Current Outpatient Prescriptions  Medication Sig Dispense Refill  . amLODipine (NORVASC) 5 MG tablet Take 1 tablet by mouth daily.    Marland Kitchen aspirin 325 MG tablet Take 325 mg by mouth once.    .  Cholecalciferol 1000 UNITS tablet Take 1,000 Units by mouth daily.    Marland Kitchen losartan-hydrochlorothiazide (HYZAAR) 100-25 MG per tablet Take 1 tablet by mouth daily. Hold for 1 week.    . metFORMIN (GLUCOPHAGE) 1000 MG tablet Take 1 tablet by mouth 2 (two) times daily.    . Multiple Vitamins-Minerals (EYE VITAMINS & MINERALS PO) Take 1 tablet by mouth daily.    . Multiple Vitamins-Minerals (ZINC PO) Take 1 tablet by mouth every other day.    . vitamin C (ASCORBIC ACID) 500 MG tablet Take 500 mg by mouth daily.    . vitamin E 400 UNIT capsule Take 400 Units by mouth daily.     No current facility-administered medications for this visit.    Allergies:   Review of patient's allergies indicates no known allergies.    Social History:  The patient  reports that he quit smoking about 61 years ago. His smoking use included Cigarettes. He has a 2.5 pack-year smoking history. He does not have any smokeless tobacco history on file. He reports that he does not drink alcohol or use illicit drugs.   Family History:  The patient's family history includes Hypertension in his father and mother.    ROS:  Please see the history of present illness.    Otherwise, review of systems positive for none.   All other  systems are reviewed and negative.    PHYSICAL EXAM: VS:  BP 158/81 mmHg  Pulse 73  Ht 5\' 9"  (1.753 m)  Wt 200 lb 6.4 oz (90.901 kg)  BMI 29.58 kg/m2 , BMI Body mass index is 29.58 kg/(m^2).  General: Alert, oriented x3, no distress Head: no evidence of trauma, PERRL, EOMI, no exophtalmos or lid lag, no myxedema, no xanthelasma; normal ears, nose and oropharynx Neck: normal jugular venous pulsations and no hepatojugular reflux; brisk carotid pulses without delay and no carotid bruits Chest: clear to auscultation, no signs of consolidation by percussion or palpation, normal fremitus, symmetrical and full respiratory excursions,  Well-healed left parasternal loop recorder site Cardiovascular: normal  position and quality of the apical impulse, regular rhythm, normal first and second heart sounds, no murmurs, rubs or gallops Abdomen: no tenderness or distention, no masses by palpation, no abnormal pulsatility or arterial bruits, normal bowel sounds, no hepatosplenomegaly Extremities: no clubbing, cyanosis or edema; 2+ radial, ulnar and brachial pulses bilaterally; 2+ right femoral, posterior tibial and dorsalis pedis pulses; 2+ left femoral, posterior tibial and dorsalis pedis pulses; no subclavian or femoral bruits Neurological: grossly nonfocal  Except mild right upper extremity weakness Psych: euthymic mood, full affect   EKG:  EKG is not ordered today.   Recent Labs: 05/27/2015: ALT 28; TSH 2.518 05/28/2015: BUN 25*; Creatinine, Ser 1.12; Hemoglobin 13.2; Platelets 169; Potassium 4.5; Sodium 137    Lipid Panel    Component Value Date/Time   CHOL 275* 05/27/2015 1136   TRIG 289* 05/27/2015 1136   HDL 40* 05/27/2015 1136   CHOLHDL 6.9 05/27/2015 1136   VLDL 58* 05/27/2015 1136   LDLCALC 177* 05/27/2015 1136      Wt Readings from Last 3 Encounters:  09/05/15 200 lb 6.4 oz (90.901 kg)  06/06/15 203 lb (92.08 kg)  05/27/15 202 lb 13.2 oz (92 kg)    .   ASSESSMENT AND PLAN:  1.  Normally functioning implantable loop recorder,  Without any arrhythmia on the first 3 months of follow-up.  He does have multiple risk factors for atrial fibrillation including systemic hypertension and obstructive sleep apnea 2.  Mild right upper extremity hemiparesis following cryptogenic stroke June 2016 3.  Essential hypertension with fair control 4.  Hyperlipidemia.  He is not on lipid-lowering therapy and if I understand him correctly has refused to take it.  Lipid profile may improve with better glycemic control, but I really think should be on a statin long-term to reduce the risk of recurrent stroke and other vascular complications 5.  Obstructive sleep apnea -  Has not had a sleep study  yet    Current medicines are reviewed at length with the patient today.  The patient does not have concerns regarding medicines.  The following changes have been made:  no change  Labs/ tests ordered today include:  No orders of the defined types were placed in this encounter.    Patient Instructions  Your physician wants you to follow-up in: 1 year or sooner if needed. You will receive a reminder letter in the mail two months in advance. If you don't receive a letter, please call our office to schedule the follow-up appointment.     Mikael Spray, MD  09/05/2015 4:15 PM    Sanda Klein, MD, [facility] [phone] office [phone] pager

## 2015-09-05 NOTE — Patient Instructions (Signed)
Your physician wants you to follow-up in: 1 year or sooner if needed. You will receive a reminder letter in the mail two months in advance. If you don't receive a letter, please call our office to schedule the follow-up appointment.  

## 2015-09-06 ENCOUNTER — Telehealth: Payer: Self-pay | Admitting: Neurology

## 2015-09-06 NOTE — Progress Notes (Signed)
I have reviewed and agreed above plan. 

## 2015-09-06 NOTE — Telephone Encounter (Signed)
Patient called regarding upcoming appointment for 3 month follow up. Patient has a lot of questions as to why Doctor wants to see him back.

## 2015-09-06 NOTE — Telephone Encounter (Signed)
He called to cancel his appt - says he feels better and wants to just follow up with his cardiologist and PCP.  I encouraged him to keep the follow up but he declined.  I told him to please call us if he needs anything.

## 2015-09-07 LAB — CUP PACEART REMOTE DEVICE CHECK: Date Time Interrogation Session: 20160916092408

## 2015-09-07 NOTE — Progress Notes (Signed)
Carelink summary report received. Battery status OK. Normal device function. No new symptom episodes, tachy episodes, brady, or pause episodes. No new AF episodes. Monthly summary reports and ROV with MD in 08/2016.

## 2015-09-11 ENCOUNTER — Ambulatory Visit: Payer: Medicare Other | Admitting: Neurology

## 2015-09-15 LAB — CUP PACEART INCLINIC DEVICE CHECK
Date Time Interrogation Session: 20160914181527
Zone Setting Detection Interval: 2000 ms
Zone Setting Detection Interval: 3000 ms
Zone Setting Detection Interval: 410 ms

## 2015-09-17 ENCOUNTER — Ambulatory Visit: Payer: Medicare Other | Admitting: Neurology

## 2015-09-25 ENCOUNTER — Encounter: Payer: Self-pay | Admitting: Cardiovascular Disease

## 2015-09-25 ENCOUNTER — Ambulatory Visit (INDEPENDENT_AMBULATORY_CARE_PROVIDER_SITE_OTHER): Payer: Medicare Other | Admitting: *Deleted

## 2015-09-25 DIAGNOSIS — I638 Other cerebral infarction: Secondary | ICD-10-CM | POA: Diagnosis not present

## 2015-09-25 DIAGNOSIS — I6389 Other cerebral infarction: Secondary | ICD-10-CM

## 2015-09-26 NOTE — Progress Notes (Signed)
Loop recorder 

## 2015-09-29 LAB — CUP PACEART REMOTE DEVICE CHECK: Date Time Interrogation Session: 20161004173830

## 2015-09-29 NOTE — Progress Notes (Signed)
Carelink summary report received. Battery status OK. Normal device function. No new symptom episodes, tachy episodes, brady, or pause episodes. No new AF episodes. Monthly summary reports and ROV w/ [facility] 9/17.

## 2015-10-08 ENCOUNTER — Encounter: Payer: Self-pay | Admitting: Cardiovascular Disease

## 2015-10-25 ENCOUNTER — Ambulatory Visit (INDEPENDENT_AMBULATORY_CARE_PROVIDER_SITE_OTHER): Payer: Medicare Other | Admitting: *Deleted

## 2015-10-25 DIAGNOSIS — I638 Other cerebral infarction: Secondary | ICD-10-CM

## 2015-10-25 DIAGNOSIS — I6389 Other cerebral infarction: Secondary | ICD-10-CM

## 2015-10-25 LAB — CUP PACEART REMOTE DEVICE CHECK: Date Time Interrogation Session: 20161103174005

## 2015-10-25 NOTE — Progress Notes (Signed)
Loop recorder 

## 2015-10-30 ENCOUNTER — Encounter: Payer: Self-pay | Admitting: Cardiovascular Disease

## 2015-11-25 NOTE — Progress Notes (Signed)
Carelink summary report received. Battery status OK. Normal device function. No new symptom episodes, tachy episodes, brady, or pause episodes. No new AF episodes. Monthly summary reports and ROV with [place] in 08/2016.

## 2015-11-26 ENCOUNTER — Ambulatory Visit (INDEPENDENT_AMBULATORY_CARE_PROVIDER_SITE_OTHER): Payer: Medicare Other | Admitting: *Deleted

## 2015-11-26 DIAGNOSIS — I638 Other cerebral infarction: Secondary | ICD-10-CM

## 2015-11-26 DIAGNOSIS — I6389 Other cerebral infarction: Secondary | ICD-10-CM

## 2015-11-26 NOTE — Progress Notes (Signed)
Carelink Summary Report / Loop Recorder 

## 2015-12-25 ENCOUNTER — Ambulatory Visit (INDEPENDENT_AMBULATORY_CARE_PROVIDER_SITE_OTHER): Payer: Medicare Other | Admitting: *Deleted

## 2015-12-25 DIAGNOSIS — I638 Other cerebral infarction: Secondary | ICD-10-CM | POA: Diagnosis not present

## 2015-12-25 DIAGNOSIS — I6389 Other cerebral infarction: Secondary | ICD-10-CM

## 2015-12-25 NOTE — Progress Notes (Signed)
Carelink Summary Report / Loop Recorder 

## 2016-01-20 ENCOUNTER — Encounter: Payer: Self-pay | Admitting: Cardiovascular Disease

## 2016-01-22 LAB — CUP PACEART REMOTE DEVICE CHECK: Date Time Interrogation Session: 20161203180730

## 2016-01-23 ENCOUNTER — Ambulatory Visit (INDEPENDENT_AMBULATORY_CARE_PROVIDER_SITE_OTHER): Payer: Medicare HMO | Admitting: *Deleted

## 2016-01-23 DIAGNOSIS — I638 Other cerebral infarction: Secondary | ICD-10-CM

## 2016-01-23 DIAGNOSIS — I6389 Other cerebral infarction: Secondary | ICD-10-CM

## 2016-01-23 NOTE — Progress Notes (Signed)
Carelink Summary Report / Loop Recorder 

## 2016-01-26 LAB — CUP PACEART REMOTE DEVICE CHECK: Date Time Interrogation Session: 20170102180630

## 2016-02-22 ENCOUNTER — Ambulatory Visit (INDEPENDENT_AMBULATORY_CARE_PROVIDER_SITE_OTHER): Payer: Medicare HMO | Admitting: *Deleted

## 2016-02-22 DIAGNOSIS — E782 Mixed hyperlipidemia: Secondary | ICD-10-CM | POA: Diagnosis not present

## 2016-02-22 DIAGNOSIS — E1142 Type 2 diabetes mellitus with diabetic polyneuropathy: Secondary | ICD-10-CM | POA: Diagnosis not present

## 2016-02-22 DIAGNOSIS — I10 Essential (primary) hypertension: Secondary | ICD-10-CM | POA: Diagnosis not present

## 2016-02-22 DIAGNOSIS — Z7984 Long term (current) use of oral hypoglycemic drugs: Secondary | ICD-10-CM | POA: Diagnosis not present

## 2016-02-22 DIAGNOSIS — C679 Malignant neoplasm of bladder, unspecified: Secondary | ICD-10-CM | POA: Diagnosis not present

## 2016-02-22 DIAGNOSIS — I638 Other cerebral infarction: Secondary | ICD-10-CM

## 2016-02-22 DIAGNOSIS — I6389 Other cerebral infarction: Secondary | ICD-10-CM

## 2016-02-22 DIAGNOSIS — Z8673 Personal history of transient ischemic attack (TIA), and cerebral infarction without residual deficits: Secondary | ICD-10-CM | POA: Diagnosis not present

## 2016-02-22 DIAGNOSIS — E1149 Type 2 diabetes mellitus with other diabetic neurological complication: Secondary | ICD-10-CM | POA: Diagnosis not present

## 2016-02-22 NOTE — Progress Notes (Signed)
Carelink Summary Report / Loop Recorder 

## 2016-02-29 LAB — CUP PACEART REMOTE DEVICE CHECK: Date Time Interrogation Session: 20170303183747

## 2016-02-29 NOTE — Progress Notes (Signed)
Carelink summary report received. Battery status OK. Normal device function. No new symptom episodes, tachy episodes, brady, or pause episodes. No new AF episodes. Monthly summary reports and ROV/PRN 

## 2016-03-23 LAB — CUP PACEART REMOTE DEVICE CHECK: Date Time Interrogation Session: 20170201184036

## 2016-03-23 NOTE — Progress Notes (Signed)
Carelink summary report received. Battery status OK. Normal device function. No new symptom episodes, brady, or pause episodes. No new AF episodes. 1 tachy episode, previously addressed. Monthly summary reports and ROV/PRN

## 2016-03-24 ENCOUNTER — Ambulatory Visit (INDEPENDENT_AMBULATORY_CARE_PROVIDER_SITE_OTHER): Payer: Medicare HMO | Admitting: *Deleted

## 2016-03-24 DIAGNOSIS — I6389 Other cerebral infarction: Secondary | ICD-10-CM

## 2016-03-24 DIAGNOSIS — I638 Other cerebral infarction: Secondary | ICD-10-CM

## 2016-03-24 NOTE — Progress Notes (Signed)
Carelink Summary Report / Loop Recorder 

## 2016-04-22 ENCOUNTER — Ambulatory Visit (INDEPENDENT_AMBULATORY_CARE_PROVIDER_SITE_OTHER): Payer: Medicare HMO | Admitting: *Deleted

## 2016-04-22 DIAGNOSIS — I638 Other cerebral infarction: Secondary | ICD-10-CM | POA: Diagnosis not present

## 2016-04-22 DIAGNOSIS — I6389 Other cerebral infarction: Secondary | ICD-10-CM

## 2016-04-22 NOTE — Progress Notes (Signed)
Carelink Summary Report / Loop Recorder 

## 2016-05-14 ENCOUNTER — Encounter: Payer: Self-pay | Admitting: Cardiovascular Disease

## 2016-05-17 LAB — CUP PACEART REMOTE DEVICE CHECK: Date Time Interrogation Session: 20170402190654

## 2016-05-17 NOTE — Progress Notes (Signed)
Carelink summary report received. Battery status OK. Normal device function. No new symptom episodes, brady, or pause episodes. No new AF episodes. 1 tachy episode, short run SVT. Monthly summary reports and ROV/PRN

## 2016-05-22 ENCOUNTER — Ambulatory Visit (INDEPENDENT_AMBULATORY_CARE_PROVIDER_SITE_OTHER): Payer: Medicare HMO | Admitting: *Deleted

## 2016-05-22 DIAGNOSIS — I6389 Other cerebral infarction: Secondary | ICD-10-CM

## 2016-05-22 DIAGNOSIS — I638 Other cerebral infarction: Secondary | ICD-10-CM

## 2016-05-23 NOTE — Progress Notes (Signed)
Carelink Summary Report / Loop Recorder 

## 2016-06-01 LAB — CUP PACEART REMOTE DEVICE CHECK: Date Time Interrogation Session: 20170502193557

## 2016-06-01 NOTE — Progress Notes (Signed)
Carelink summary report received. Battery status OK. Normal device function. No new symptom episodes, tachy episodes, brady, or pause episodes. No new AF episodes. Monthly summary reports and ROV/PRN 

## 2016-06-23 ENCOUNTER — Ambulatory Visit (INDEPENDENT_AMBULATORY_CARE_PROVIDER_SITE_OTHER): Payer: Medicare HMO | Admitting: *Deleted

## 2016-06-23 DIAGNOSIS — I6389 Other cerebral infarction: Secondary | ICD-10-CM

## 2016-06-23 DIAGNOSIS — I638 Other cerebral infarction: Secondary | ICD-10-CM

## 2016-06-23 NOTE — Progress Notes (Signed)
Carelink Summary Report / Loop Recorder 

## 2016-06-30 LAB — CUP PACEART REMOTE DEVICE CHECK: Date Time Interrogation Session: 20170701194547

## 2016-07-01 LAB — CUP PACEART REMOTE DEVICE CHECK: Date Time Interrogation Session: 20170601193959

## 2016-07-04 ENCOUNTER — Telehealth: Payer: Self-pay | Admitting: Cardiovascular Disease

## 2016-07-04 ENCOUNTER — Telehealth: Payer: Self-pay | Admitting: Cardiology

## 2016-07-04 NOTE — Telephone Encounter (Signed)
LMOVM requesting that pt send manual transmission b/c home monitor has not updated in at least 14 days.    

## 2016-07-04 NOTE — Telephone Encounter (Signed)
New message       1. Has your device fired? no  2. Is you device beeping? no  3. Are you experiencing draining or swelling at device site? no  4. Are you calling to see if we received your device transmission? Pt states the device is not down loading correctly  5. Have you passed out? no

## 2016-07-04 NOTE — Telephone Encounter (Signed)
Spoke with pt regarding not receiving transmission. Informed pt to check if monitor is plugged in and to send a manual transmission today to reestablish connection. Pt voiced understanding and to call if he has anymore questions.

## 2016-07-10 ENCOUNTER — Encounter: Payer: Self-pay | Admitting: Cardiology

## 2016-07-11 DIAGNOSIS — H35373 Puckering of macula, bilateral: Secondary | ICD-10-CM | POA: Diagnosis not present

## 2016-07-11 DIAGNOSIS — H02831 Dermatochalasis of right upper eyelid: Secondary | ICD-10-CM | POA: Diagnosis not present

## 2016-07-11 DIAGNOSIS — H353131 Nonexudative age-related macular degeneration, bilateral, early dry stage: Secondary | ICD-10-CM | POA: Diagnosis not present

## 2016-07-11 DIAGNOSIS — E119 Type 2 diabetes mellitus without complications: Secondary | ICD-10-CM | POA: Diagnosis not present

## 2016-07-11 DIAGNOSIS — Z961 Presence of intraocular lens: Secondary | ICD-10-CM | POA: Diagnosis not present

## 2016-07-11 DIAGNOSIS — H02834 Dermatochalasis of left upper eyelid: Secondary | ICD-10-CM | POA: Diagnosis not present

## 2016-07-11 DIAGNOSIS — H26492 Other secondary cataract, left eye: Secondary | ICD-10-CM | POA: Diagnosis not present

## 2016-07-16 ENCOUNTER — Telehealth: Payer: Self-pay | Admitting: Cardiology

## 2016-07-16 NOTE — Telephone Encounter (Signed)
Spoke w/ pt and attempted to help him send a manual transmission w/ his home monitor b/c his monitor has not updated since 06-21-16. Pt monitor would not turn on. Instructed pt to call tech services to get more help trouble shooting his monitor or to order a new one. Pt verbalized understanding.

## 2016-07-21 ENCOUNTER — Encounter: Payer: Medicare HMO | Admitting: *Deleted

## 2016-08-20 ENCOUNTER — Ambulatory Visit (INDEPENDENT_AMBULATORY_CARE_PROVIDER_SITE_OTHER): Payer: Medicare HMO | Admitting: *Deleted

## 2016-08-20 DIAGNOSIS — I6389 Other cerebral infarction: Secondary | ICD-10-CM

## 2016-08-20 DIAGNOSIS — I638 Other cerebral infarction: Secondary | ICD-10-CM | POA: Diagnosis not present

## 2016-08-21 DIAGNOSIS — C672 Malignant neoplasm of lateral wall of bladder: Secondary | ICD-10-CM | POA: Diagnosis not present

## 2016-08-22 NOTE — Progress Notes (Signed)
Carelink Summary Report / Loop Recorder 

## 2016-09-13 LAB — CUP PACEART REMOTE DEVICE CHECK: Date Time Interrogation Session: 20170830203703

## 2016-09-13 NOTE — Progress Notes (Signed)
Carelink summary report received. Battery status OK. Normal device function. No new symptom episodes, tachy episodes, brady, or pause episodes. No new AF episodes. Monthly summary reports and ROV/PRN 

## 2016-09-19 ENCOUNTER — Ambulatory Visit (INDEPENDENT_AMBULATORY_CARE_PROVIDER_SITE_OTHER): Payer: Medicare HMO | Admitting: *Deleted

## 2016-09-19 DIAGNOSIS — I6389 Other cerebral infarction: Secondary | ICD-10-CM

## 2016-09-19 DIAGNOSIS — I638 Other cerebral infarction: Secondary | ICD-10-CM | POA: Diagnosis not present

## 2016-09-22 NOTE — Progress Notes (Signed)
Carelink Summary Report / Loop Recorder 

## 2016-10-03 DIAGNOSIS — Z23 Encounter for immunization: Secondary | ICD-10-CM | POA: Diagnosis not present

## 2016-10-03 DIAGNOSIS — I10 Essential (primary) hypertension: Secondary | ICD-10-CM | POA: Diagnosis not present

## 2016-10-03 DIAGNOSIS — Z1389 Encounter for screening for other disorder: Secondary | ICD-10-CM | POA: Diagnosis not present

## 2016-10-03 DIAGNOSIS — C679 Malignant neoplasm of bladder, unspecified: Secondary | ICD-10-CM | POA: Diagnosis not present

## 2016-10-03 DIAGNOSIS — Z8673 Personal history of transient ischemic attack (TIA), and cerebral infarction without residual deficits: Secondary | ICD-10-CM | POA: Diagnosis not present

## 2016-10-03 DIAGNOSIS — E782 Mixed hyperlipidemia: Secondary | ICD-10-CM | POA: Diagnosis not present

## 2016-10-03 DIAGNOSIS — N529 Male erectile dysfunction, unspecified: Secondary | ICD-10-CM | POA: Diagnosis not present

## 2016-10-03 DIAGNOSIS — E1149 Type 2 diabetes mellitus with other diabetic neurological complication: Secondary | ICD-10-CM | POA: Diagnosis not present

## 2016-10-03 DIAGNOSIS — E1142 Type 2 diabetes mellitus with diabetic polyneuropathy: Secondary | ICD-10-CM | POA: Diagnosis not present

## 2016-10-20 ENCOUNTER — Ambulatory Visit (INDEPENDENT_AMBULATORY_CARE_PROVIDER_SITE_OTHER): Payer: Medicare HMO | Admitting: *Deleted

## 2016-10-20 DIAGNOSIS — I638 Other cerebral infarction: Secondary | ICD-10-CM | POA: Diagnosis not present

## 2016-10-20 DIAGNOSIS — I6389 Other cerebral infarction: Secondary | ICD-10-CM

## 2016-10-20 NOTE — Progress Notes (Signed)
Carelink Summary Report / Loop Recorder 

## 2016-10-23 ENCOUNTER — Telehealth: Payer: Self-pay | Admitting: Cardiology

## 2016-10-23 NOTE — Telephone Encounter (Signed)
Spoke w/ pt wife and requested that he send a manual transmission b/c his home monitor has not updated in at least 14 days.   

## 2016-10-31 LAB — CUP PACEART REMOTE DEVICE CHECK
Date Time Interrogation Session: 20170929211035
Implantable Pulse Generator Implant Date: 20160606

## 2016-10-31 NOTE — Progress Notes (Signed)
Carelink summary report received. Battery status OK. Normal device function. No new symptom episodes, tachy episodes, brady, or pause episodes. 33 AF 0.6% available ECGs previously reviewed. Monthly summary reports and ROV/PRN

## 2016-11-07 DIAGNOSIS — N410 Acute prostatitis: Secondary | ICD-10-CM | POA: Diagnosis not present

## 2016-11-17 DIAGNOSIS — C678 Malignant neoplasm of overlapping sites of bladder: Secondary | ICD-10-CM | POA: Diagnosis not present

## 2016-11-18 ENCOUNTER — Ambulatory Visit (INDEPENDENT_AMBULATORY_CARE_PROVIDER_SITE_OTHER): Payer: Medicare HMO | Admitting: *Deleted

## 2016-11-18 DIAGNOSIS — I638 Other cerebral infarction: Secondary | ICD-10-CM | POA: Diagnosis not present

## 2016-11-18 DIAGNOSIS — I6389 Other cerebral infarction: Secondary | ICD-10-CM

## 2016-11-18 LAB — CUP PACEART REMOTE DEVICE CHECK
Date Time Interrogation Session: 20171128221318
Implantable Pulse Generator Implant Date: 20160606

## 2016-11-19 NOTE — Progress Notes (Signed)
Carelink Summary Report / Loop Recorder 

## 2016-11-20 ENCOUNTER — Telehealth: Payer: Self-pay | Admitting: Cardiology

## 2016-11-20 NOTE — Telephone Encounter (Signed)
LMOVM requesting that pt send manual transmission b/c home monitor has not updated in at least 14 days.    

## 2016-11-27 LAB — CUP PACEART REMOTE DEVICE CHECK
Date Time Interrogation Session: 20171029213958
Implantable Pulse Generator Implant Date: 20160606

## 2016-11-27 NOTE — Progress Notes (Signed)
Carelink summary report received. Battery status OK. Normal device function. No new symptom episodes, tachy episodes, brady, or pause episodes. No new AF episodes. Monthly summary reports and ROV/PRN 

## 2016-12-03 ENCOUNTER — Telehealth: Payer: Self-pay | Admitting: *Deleted

## 2016-12-03 NOTE — Telephone Encounter (Signed)
[facility] requesting call back.  [facility] phone number for return call.  Will request that patient send an manual transmission from [facility] home monitor.  Tachy episode noted by device, but no EGM available; automatic transmission likely interrupted.

## 2016-12-04 NOTE — Telephone Encounter (Signed)
Patient returned call.  Appears that cell signal may be dropping as patient's monitor is located ~61ft from the window.  Assisted patient in repositioning monitor and sending manual transmission for review.  Transmission coming across slowly, advised patient I will call him back if it doesn't come through.  He verbalizes understanding and appreciation.  Patient denies additional questions or concerns at this time.  Transmission received.  Tachy episode from 11/23 appears SVT, placed in Dr. Langley Gauss folder for review.

## 2016-12-18 ENCOUNTER — Ambulatory Visit (INDEPENDENT_AMBULATORY_CARE_PROVIDER_SITE_OTHER): Payer: Medicare HMO | Admitting: *Deleted

## 2016-12-18 DIAGNOSIS — I638 Other cerebral infarction: Secondary | ICD-10-CM | POA: Diagnosis not present

## 2016-12-18 DIAGNOSIS — I6389 Other cerebral infarction: Secondary | ICD-10-CM

## 2016-12-19 NOTE — Progress Notes (Signed)
Carelink Summary Report / Loop Recorder 

## 2016-12-27 NOTE — Progress Notes (Signed)
Carelink summary report received. Battery status OK. Normal device function. No new symptom episodes, tachy episodes, brady, or pause episodes. No new AF episodes. Monthly summary reports and ROV/PRN 

## 2017-01-18 DIAGNOSIS — R5383 Other fatigue: Secondary | ICD-10-CM | POA: Diagnosis not present

## 2017-01-19 ENCOUNTER — Ambulatory Visit (INDEPENDENT_AMBULATORY_CARE_PROVIDER_SITE_OTHER): Payer: Medicare HMO | Admitting: *Deleted

## 2017-01-19 DIAGNOSIS — I638 Other cerebral infarction: Secondary | ICD-10-CM | POA: Diagnosis not present

## 2017-01-19 DIAGNOSIS — I6389 Other cerebral infarction: Secondary | ICD-10-CM

## 2017-01-19 NOTE — Progress Notes (Signed)
Carelink Summary Report / Loop Recorder 

## 2017-01-20 ENCOUNTER — Telehealth: Payer: Self-pay | Admitting: Cardiology

## 2017-01-20 NOTE — Telephone Encounter (Signed)
Spoke w/ pt and requested that he send a manual transmission b/c his home monitor has not updated in at least 14 days.   

## 2017-01-21 ENCOUNTER — Other Ambulatory Visit: Payer: Self-pay | Admitting: Cardiovascular Disease

## 2017-01-29 LAB — CUP PACEART REMOTE DEVICE CHECK
Date Time Interrogation Session: 20171228230659
Implantable Pulse Generator Implant Date: 20160606

## 2017-02-05 ENCOUNTER — Other Ambulatory Visit: Payer: Self-pay | Admitting: Cardiovascular Disease

## 2017-02-09 DIAGNOSIS — Z6829 Body mass index (BMI) 29.0-29.9, adult: Secondary | ICD-10-CM | POA: Diagnosis not present

## 2017-02-09 DIAGNOSIS — H9113 Presbycusis, bilateral: Secondary | ICD-10-CM | POA: Diagnosis not present

## 2017-02-09 DIAGNOSIS — E1142 Type 2 diabetes mellitus with diabetic polyneuropathy: Secondary | ICD-10-CM | POA: Diagnosis not present

## 2017-02-09 DIAGNOSIS — Z972 Presence of dental prosthetic device (complete) (partial): Secondary | ICD-10-CM | POA: Diagnosis not present

## 2017-02-09 DIAGNOSIS — Z7984 Long term (current) use of oral hypoglycemic drugs: Secondary | ICD-10-CM | POA: Diagnosis not present

## 2017-02-09 DIAGNOSIS — I10 Essential (primary) hypertension: Secondary | ICD-10-CM | POA: Diagnosis not present

## 2017-02-09 DIAGNOSIS — Z8551 Personal history of malignant neoplasm of bladder: Secondary | ICD-10-CM | POA: Diagnosis not present

## 2017-02-09 DIAGNOSIS — Z0000 Encounter for general adult medical examination without abnormal findings: Secondary | ICD-10-CM | POA: Diagnosis not present

## 2017-02-16 ENCOUNTER — Ambulatory Visit (INDEPENDENT_AMBULATORY_CARE_PROVIDER_SITE_OTHER): Payer: Medicare HMO | Admitting: *Deleted

## 2017-02-16 DIAGNOSIS — I6389 Other cerebral infarction: Secondary | ICD-10-CM

## 2017-02-16 DIAGNOSIS — I638 Other cerebral infarction: Secondary | ICD-10-CM

## 2017-02-17 NOTE — Progress Notes (Signed)
Carelink Summary Report / Loop Recorder 

## 2017-02-18 ENCOUNTER — Ambulatory Visit (INDEPENDENT_AMBULATORY_CARE_PROVIDER_SITE_OTHER): Payer: Medicare HMO | Admitting: Cardiovascular Disease

## 2017-02-18 ENCOUNTER — Encounter: Payer: Self-pay | Admitting: Cardiovascular Disease

## 2017-02-18 VITALS — BP 130/71 | HR 73 | Ht 69.0 in | Wt 191.0 lb

## 2017-02-18 DIAGNOSIS — I10 Essential (primary) hypertension: Secondary | ICD-10-CM | POA: Diagnosis not present

## 2017-02-18 DIAGNOSIS — Z8673 Personal history of transient ischemic attack (TIA), and cerebral infarction without residual deficits: Secondary | ICD-10-CM

## 2017-02-18 DIAGNOSIS — E782 Mixed hyperlipidemia: Secondary | ICD-10-CM | POA: Diagnosis not present

## 2017-02-18 DIAGNOSIS — R2681 Unsteadiness on feet: Secondary | ICD-10-CM | POA: Diagnosis not present

## 2017-02-18 DIAGNOSIS — Z4509 Encounter for adjustment and management of other cardiac device: Secondary | ICD-10-CM

## 2017-02-18 DIAGNOSIS — E1159 Type 2 diabetes mellitus with other circulatory complications: Secondary | ICD-10-CM

## 2017-02-18 LAB — CUP PACEART REMOTE DEVICE CHECK
Date Time Interrogation Session: 20180127233818
Implantable Pulse Generator Implant Date: 20160606

## 2017-02-18 LAB — CUP PACEART INCLINIC DEVICE CHECK
Date Time Interrogation Session: 20180228142845
Implantable Pulse Generator Implant Date: 20160606

## 2017-02-18 NOTE — Progress Notes (Addendum)
Cardiology Office Note    Date:  02/18/2017   ID:  Henry Wiggins, DOB August 27, 1927, MRN [id number]  PCP:  Gara Kroner, MD  Cardiologist:   Sanda Klein, MD   Chief Complaint  Patient presents with  . Follow-up    pt c/o soreness in his chest for a few days     History of Present Illness:  Henry Wiggins is a 83 y.o. male with a history of cryptogenic stroke in June 2016 loop recorder implanted at that time that so far has not shown any evidence of atrial fibrillation or other meaningful arrhythmia. He has not had recurrent stroke. Comorbid conditions including severe hypercholesterolemia, type 2 diabetes mellitus, hypertension, but does not have known coronary artery disease, carotid stenosis or other complications of atherosclerosis. Had severe myalgias and muscle weakness from statins, tried 3 different agents unsuccessfully. His transesophageal echo in 2016 showed mild LVH and diastolic dysfunction, but was otherwise a normal study, as left atrium and no evidence of interatrial shunt.  This is his first follow-up since September 2016. Has problems with gait instability and poor balance.  Over time, his loop recorder that has shown occasional brief episodes of nonsustained paroxysmal atrial tachycardia, but never atrial fibrillation. A few episodes are artifactual due to external interference. No significant bradycardia has been seen.  Past Medical History:  Diagnosis Date  . Bladder cancer (HCC)    Dr. Janice Norrie  . Diabetes ([place])   . Hyperlipidemia   . Hypertension   . Stroke ([place])   . UTI (lower urinary tract infection)     Past Surgical History:  Procedure Laterality Date  . APPENDECTOMY    . CARPAL TUNNEL RELEASE Right   . CATARACT EXTRACTION Bilateral   . CHOLECYSTECTOMY    . EP IMPLANTABLE DEVICE N/A 05/28/2015   Procedure: Loop Recorder Insertion;  Surgeon: Sanda Klein, MD;  Location: [facility];  Service: Cardiovascular;  Laterality: N/A;  . HERNIA  REPAIR     x4  . TEE WITHOUT CARDIOVERSION N/A 05/28/2015   Procedure: TRANSESOPHAGEAL ECHOCARDIOGRAM (TEE);  Surgeon: Sanda Klein, MD;  Location: [facility] ENDOSCOPY;  Service: Cardiovascular;  Laterality: N/A;  . TONSILLECTOMY      Current Medications: Outpatient Medications Prior to Visit  Medication Sig Dispense Refill  . amLODipine (NORVASC) 5 MG tablet Take 1 tablet by mouth daily.    Marland Kitchen aspirin 325 MG tablet Take 325 mg by mouth once.    . Cholecalciferol 1000 UNITS tablet Take 1,000 Units by mouth daily.    Marland Kitchen losartan-hydrochlorothiazide (HYZAAR) 100-25 MG per tablet Take 1 tablet by mouth daily. Hold for 1 week.    . metFORMIN (GLUCOPHAGE) 1000 MG tablet Take 1 tablet by mouth 2 (two) times daily.    . Multiple Vitamins-Minerals (EYE VITAMINS & MINERALS PO) Take 1 tablet by mouth daily.    . Multiple Vitamins-Minerals (ZINC PO) Take 1 tablet by mouth every other day.    . vitamin C (ASCORBIC ACID) 500 MG tablet Take 500 mg by mouth daily.    . vitamin E 400 UNIT capsule Take 400 Units by mouth daily.     No facility-administered medications prior to visit.      Allergies:   Patient has no known allergies.   Social History   Social History  . Marital status: Married    Spouse name: N/A  . Number of children: 5  . Years of education: 10   Occupational History  . Retired  Social History Main Topics  . Smoking status: Former Smoker    Packs/day: 0.50    Years: 5.00    Types: Cigarettes    Quit date: 12/22/1953  . Smokeless tobacco: Not on file  . Alcohol use No  . Drug use: No  . Sexual activity: Not on file   Other Topics Concern  . Not on file   Social History Narrative   Lives at home with his wife.   Right-handed.   2 cups caffeine per day.     Family History:  The patient's family history includes Hypertension in his father and mother.   ROS:   Please see the history of present illness.    ROS All other systems reviewed and are negative.   PHYSICAL  EXAM:   VS:  BP 130/71   Pulse 73   Ht 5\' 9"  (1.753 m)   Wt 86.6 kg (191 lb)   BMI 28.21 kg/m    GEN: Well nourished, well developed, in no acute distress  HEENT: normal  Neck: no JVD, carotid bruits, or masses Cardiac: RRR; no murmurs, rubs, or gallops,no edema ,loop  recorder site is well-healed Respiratory:  clear to auscultation bilaterally, normal work of breathing GI: soft, nontender, nondistended, + BS MS: no deformity or atrophy  Skin: warm and dry, no rash Neuro:  Alert and Oriented x 3, Strength and sensation are intact Psych: euthymic mood, full affect  Wt Readings from Last 3 Encounters:  02/18/17 86.6 kg (191 lb)  09/05/15 90.9 kg (200 lb 6.4 oz)  06/06/15 92.1 kg (203 lb)      Studies/Labs Reviewed:   EKG:  EKG is ordered today.  The ekg ordered today demonstrates This rhythm, left axis deviation and left anterior fascicular block, nonspecific T-wave changes  Recent Labs: No results found for requested labs within last 8760 hours.   Lipid Panel    Component Value Date/Time   CHOL 275 (H) 05/27/2015 1136   TRIG 289 (H) 05/27/2015 1136   HDL 40 (L) 05/27/2015 1136   CHOLHDL 6.9 05/27/2015 1136   VLDL 58 (H) 05/27/2015 1136   LDLCALC 177 (H) 05/27/2015 1136    ASSESSMENT:    1. History of arterial ischemic stroke   2. Unstable gait   3. Mixed hyperlipidemia   4. Essential hypertension   5. Type 2 diabetes mellitus with other circulatory complication, without long-term current use of insulin (HCC)      PLAN:  In order of problems listed above:  1. Cryptogenic stroke: No good explanation for this, although he has numerous risk factors including diabetes, hyperlipidemia and hypertension.  2. Gait instability: Strongly recommended that he consider physical therapy evaluation and treatment. He doesn't want to do it. 3. HLP: He had severe mixed hyperlipidemia but was intolerant of 3 different statins. Zetia was reportedly tried unsuccessfully as well. He  does not want to take any other agents, mentioning the numerous newspaper reports about side effects from cholesterol lowering medicines. 4. HTN: Control is good. No evidence of orthostatic hypotension. 5. DM: Metformin monotherapy, don't have his most recent glycohemoglobin  Medication Adjustments/Labs and Tests Ordered: Current medicines are reviewed at length with the patient today.  Concerns regarding medicines are outlined above.  Medication changes, Labs and Tests ordered today are listed in the Patient Instructions below. Patient Instructions  Dr Sallyanne Kuster recommends that you schedule a follow-up appointment in 1 year. You will receive a reminder letter in the mail two months in advance. If you  don't receive a letter, please call our office to schedule the follow-up appointment.  If you need a refill on your cardiac medications before your next appointment, please call your pharmacy.    Signed, Sanda Klein, MD  02/18/2017 9:00 AM    [facility] [place], [place], [place]  [address] Phone: [phone]; Fax: [phone]

## 2017-02-18 NOTE — Progress Notes (Signed)
Carelink summary report received. Battery status OK. Normal device function. No new symptom episodes, tachy episodes, brady, or pause episodes. No new AF episodes. Monthly summary reports and ROV/PRN 

## 2017-02-18 NOTE — Patient Instructions (Signed)
Dr Croitoru recommends that you schedule a follow-up appointment in 1 year. You will receive a reminder letter in the mail two months in advance. If you don't receive a letter, please call our office to schedule the follow-up appointment.  If you need a refill on your cardiac medications before your next appointment, please call your pharmacy. 

## 2017-03-04 LAB — CUP PACEART REMOTE DEVICE CHECK
Date Time Interrogation Session: 20180226233856
Implantable Pulse Generator Implant Date: 20160606

## 2017-03-18 ENCOUNTER — Ambulatory Visit (INDEPENDENT_AMBULATORY_CARE_PROVIDER_SITE_OTHER): Payer: Medicare HMO | Admitting: *Deleted

## 2017-03-18 DIAGNOSIS — I6389 Other cerebral infarction: Secondary | ICD-10-CM

## 2017-03-18 DIAGNOSIS — I638 Other cerebral infarction: Secondary | ICD-10-CM | POA: Diagnosis not present

## 2017-03-19 NOTE — Progress Notes (Signed)
Carelink Summary Report / Loop Recorder 

## 2017-04-02 LAB — CUP PACEART REMOTE DEVICE CHECK
Date Time Interrogation Session: 20180329000758
Implantable Pulse Generator Implant Date: 20160606

## 2017-04-17 ENCOUNTER — Ambulatory Visit (INDEPENDENT_AMBULATORY_CARE_PROVIDER_SITE_OTHER): Payer: Medicare HMO | Admitting: *Deleted

## 2017-04-17 DIAGNOSIS — I638 Other cerebral infarction: Secondary | ICD-10-CM | POA: Diagnosis not present

## 2017-04-17 DIAGNOSIS — I6389 Other cerebral infarction: Secondary | ICD-10-CM

## 2017-04-20 NOTE — Progress Notes (Signed)
Carelink Summary Report / Loop Recorder 

## 2017-04-30 LAB — CUP PACEART REMOTE DEVICE CHECK
Date Time Interrogation Session: 20180428004328
Implantable Pulse Generator Implant Date: 20160606

## 2017-05-01 DIAGNOSIS — N529 Male erectile dysfunction, unspecified: Secondary | ICD-10-CM | POA: Diagnosis not present

## 2017-05-01 DIAGNOSIS — E782 Mixed hyperlipidemia: Secondary | ICD-10-CM | POA: Diagnosis not present

## 2017-05-01 DIAGNOSIS — Z7984 Long term (current) use of oral hypoglycemic drugs: Secondary | ICD-10-CM | POA: Diagnosis not present

## 2017-05-01 DIAGNOSIS — E1142 Type 2 diabetes mellitus with diabetic polyneuropathy: Secondary | ICD-10-CM | POA: Diagnosis not present

## 2017-05-01 DIAGNOSIS — Z8673 Personal history of transient ischemic attack (TIA), and cerebral infarction without residual deficits: Secondary | ICD-10-CM | POA: Diagnosis not present

## 2017-05-01 DIAGNOSIS — E1149 Type 2 diabetes mellitus with other diabetic neurological complication: Secondary | ICD-10-CM | POA: Diagnosis not present

## 2017-05-01 DIAGNOSIS — I10 Essential (primary) hypertension: Secondary | ICD-10-CM | POA: Diagnosis not present

## 2017-05-01 DIAGNOSIS — C679 Malignant neoplasm of bladder, unspecified: Secondary | ICD-10-CM | POA: Diagnosis not present

## 2017-05-01 DIAGNOSIS — R69 Illness, unspecified: Secondary | ICD-10-CM | POA: Diagnosis not present

## 2017-05-12 ENCOUNTER — Ambulatory Visit (INDEPENDENT_AMBULATORY_CARE_PROVIDER_SITE_OTHER): Payer: Medicare HMO | Admitting: Cardiovascular Disease

## 2017-05-12 ENCOUNTER — Encounter: Payer: Self-pay | Admitting: Cardiovascular Disease

## 2017-05-12 VITALS — BP 140/76 | HR 69 | Ht 69.0 in | Wt 195.0 lb

## 2017-05-12 DIAGNOSIS — E669 Obesity, unspecified: Secondary | ICD-10-CM | POA: Diagnosis not present

## 2017-05-12 DIAGNOSIS — I10 Essential (primary) hypertension: Secondary | ICD-10-CM

## 2017-05-12 DIAGNOSIS — E1169 Type 2 diabetes mellitus with other specified complication: Secondary | ICD-10-CM | POA: Diagnosis not present

## 2017-05-12 DIAGNOSIS — Z8673 Personal history of transient ischemic attack (TIA), and cerebral infarction without residual deficits: Secondary | ICD-10-CM

## 2017-05-12 DIAGNOSIS — E782 Mixed hyperlipidemia: Secondary | ICD-10-CM | POA: Diagnosis not present

## 2017-05-12 NOTE — Progress Notes (Signed)
Cardiology Office Note    Date:  05/12/2017   ID:  Henry Wiggins, DOB 1927/12/19, MRN [id number]  PCP:  Antony Contras, MD  Cardiologist:   Sanda Klein, MD   Chief Complaint  Patient presents with  . Follow-up    History of Present Illness:  Henry Wiggins is a 83 y.o. male with a history of cryptogenic stroke in June 2016. Loop recorder implanted at that time has not shown atrial fibrillation or other meaningful arrhythmia. He has not had recurrent stroke. He does not want to continue paying the monthly fee for loop recorder download. The device does not bother him otherwise and he does not necessarily want it removed, but he is troubled by the disease incurred with the monthly downloads.  His balance remains tenuous and he has had a couple of falls in the last 12 months. He does not have symptoms of orthostatic hypotension.  Comorbid conditions including severe hypercholesterolemia, type 2 diabetes mellitus, hypertension, but does not have known coronary artery disease, carotid stenosis or other complications of atherosclerosis. Had severe myalgias and muscle weakness from statins, tried 3 different agents unsuccessfully. His transesophageal echo in 2016 showed mild LVH and diastolic dysfunction, but was otherwise a normal study, normal size left atrium and no evidence of interatrial shunt.  Over time, his loop recorder that has shown occasional brief episodes of nonsustained paroxysmal atrial tachycardia, but never atrial fibrillation. A few episodes are artifactual due to external interference. No significant bradycardia has been seen.  Past Medical History:  Diagnosis Date  . Bladder cancer (HCC)    Dr. Janice Norrie  . Diabetes ([place])   . Hyperlipidemia   . Hypertension   . Stroke ([place])   . UTI (lower urinary tract infection)     Past Surgical History:  Procedure Laterality Date  . APPENDECTOMY    . CARPAL TUNNEL RELEASE Right   . CATARACT EXTRACTION Bilateral   .  CHOLECYSTECTOMY    . EP IMPLANTABLE DEVICE N/A 05/28/2015   Procedure: Loop Recorder Insertion;  Surgeon: Sanda Klein, MD;  Location: [facility];  Service: Cardiovascular;  Laterality: N/A;  . HERNIA REPAIR     x4  . TEE WITHOUT CARDIOVERSION N/A 05/28/2015   Procedure: TRANSESOPHAGEAL ECHOCARDIOGRAM (TEE);  Surgeon: Sanda Klein, MD;  Location: [facility] ENDOSCOPY;  Service: Cardiovascular;  Laterality: N/A;  . TONSILLECTOMY      Current Medications: Outpatient Medications Prior to Visit  Medication Sig Dispense Refill  . aspirin 325 MG tablet Take 325 mg by mouth once.    . Cholecalciferol 1000 UNITS tablet Take 1,000 Units by mouth daily.    Marland Kitchen losartan-hydrochlorothiazide (HYZAAR) 100-25 MG per tablet Take 1 tablet by mouth daily. Hold for 1 week.    . metFORMIN (GLUCOPHAGE) 1000 MG tablet Take 1 tablet by mouth 2 (two) times daily.    . Multiple Vitamins-Minerals (EYE VITAMINS & MINERALS PO) Take 1 tablet by mouth daily.    . Multiple Vitamins-Minerals (ZINC PO) Take 1 tablet by mouth every other day.    . vitamin C (ASCORBIC ACID) 500 MG tablet Take 500 mg by mouth daily.    . vitamin E 400 UNIT capsule Take 400 Units by mouth daily.    Marland Kitchen amLODipine (NORVASC) 5 MG tablet Take 1 tablet by mouth daily.     No facility-administered medications prior to visit.      Allergies:   Patient has no known allergies.   Social History   Social History  .  Marital status: Married    Spouse name: N/A  . Number of children: 5  . Years of education: 10   Occupational History  . Retired    Social History Main Topics  . Smoking status: Former Smoker    Packs/day: 0.50    Years: 5.00    Types: Cigarettes    Quit date: 12/22/1953  . Smokeless tobacco: Never Used  . Alcohol use No  . Drug use: No  . Sexual activity: Not Asked   Other Topics Concern  . None   Social History Narrative   Lives at home with his wife.   Right-handed.   2 cups caffeine per day.     Family History:   The patient's family history includes Hypertension in his father and mother.   ROS:   Please see the history of present illness.    ROS All other systems reviewed and are negative.   PHYSICAL EXAM:   VS:  BP 140/76   Pulse 69   Ht 5\' 9"  (1.753 m)   Wt 195 lb (88.5 kg)   BMI 28.80 kg/m    GEN: Well nourished, well developed, in no acute distress  HEENT: normal  Neck: no JVD, carotid bruits, or masses Cardiac: RRR; no murmurs, rubs, or gallops,no edema ,loop  recorder site is well-healed Respiratory:  clear to auscultation bilaterally, normal work of breathing GI: soft, nontender, nondistended, + BS MS: no deformity or atrophy  Skin: warm and dry, no rash Neuro:  Alert and Oriented x 3, Strength and sensation are intact Psych: euthymic mood, full affect  Wt Readings from Last 3 Encounters:  05/12/17 195 lb (88.5 kg)  02/18/17 191 lb (86.6 kg)  09/05/15 200 lb 6.4 oz (90.9 kg)      Studies/Labs Reviewed:   EKG:  EKG is ordered today.  The ekg ordered today demonstrates sinus rhythm, normal tracing. There is less left axis deviation compared to previous tracings. Lipid Panel    Component Value Date/Time   CHOL 275 (H) 05/27/2015 1136   TRIG 289 (H) 05/27/2015 1136   HDL 40 (L) 05/27/2015 1136   CHOLHDL 6.9 05/27/2015 1136   VLDL 58 (H) 05/27/2015 1136   LDLCALC 177 (H) 05/27/2015 1136    ASSESSMENT:    1. History of arterial ischemic stroke   2. Mixed hyperlipidemia   3. Essential hypertension   4. Diabetes mellitus type 2 in obese [facility])      PLAN:  In order of problems listed above:  1. Cryptogenic stroke: No good explanation for this, although he has numerous risk factors including diabetes, hyperlipidemia and hypertension. No recurrence. In almost 2 years of remote monitoring, his loop recorder has not shown evidence of atrial fibrillation,  although he does have frequent PACs leading to artifactual recordings. At his request, will stop performing routine  downloads. We'll bring him back to the office in roughly 1 year, before the anticipated end of battery service to do one final download. 2. HLP: intolerant of statins, ezetimibe ineffective. Does not want to try other lipid-lowering agents. Does not have established vascular/coronary disease. 3. HTN: Control is fair. 4. DM: weight loss recommended. I don't have a recent hemoglobin A1c. Ideally LDL cholesterol target < 100, but we have not found an effective agent that he can tolerate.   Medication Adjustments/Labs and Tests Ordered: Current medicines are reviewed at length with the patient today.  Concerns regarding medicines are outlined above.  Medication changes, Labs and Tests ordered today are  listed in the Patient Instructions below. Patient Instructions  Dr Sallyanne Kuster recommends that you schedule a follow-up appointment in 12 months. You will receive a reminder letter in the mail two months in advance. If you don't receive a letter, please call our office to schedule the follow-up appointment.  If you need a refill on your cardiac medications before your next appointment, please call your pharmacy.    Signed, Sanda Klein, MD  05/12/2017 11:41 AM    [facility] [place], [place], [place]  [address] Phone: [phone]; Fax: [phone]

## 2017-05-12 NOTE — Patient Instructions (Signed)
Dr Croitoru recommends that you schedule a follow-up appointment in 12 months. You will receive a reminder letter in the mail two months in advance. If you don't receive a letter, please call our office to schedule the follow-up appointment.  If you need a refill on your cardiac medications before your next appointment, please call your pharmacy. 

## 2017-07-17 ENCOUNTER — Encounter: Payer: Self-pay | Admitting: Cardiology

## 2017-09-15 DIAGNOSIS — Z23 Encounter for immunization: Secondary | ICD-10-CM | POA: Diagnosis not present

## 2017-10-20 DIAGNOSIS — H02834 Dermatochalasis of left upper eyelid: Secondary | ICD-10-CM | POA: Diagnosis not present

## 2017-10-20 DIAGNOSIS — H353132 Nonexudative age-related macular degeneration, bilateral, intermediate dry stage: Secondary | ICD-10-CM | POA: Diagnosis not present

## 2017-10-20 DIAGNOSIS — H02831 Dermatochalasis of right upper eyelid: Secondary | ICD-10-CM | POA: Diagnosis not present

## 2017-10-20 DIAGNOSIS — H35373 Puckering of macula, bilateral: Secondary | ICD-10-CM | POA: Diagnosis not present

## 2017-10-20 DIAGNOSIS — Z961 Presence of intraocular lens: Secondary | ICD-10-CM | POA: Diagnosis not present

## 2017-10-20 DIAGNOSIS — H26492 Other secondary cataract, left eye: Secondary | ICD-10-CM | POA: Diagnosis not present

## 2017-10-20 DIAGNOSIS — E119 Type 2 diabetes mellitus without complications: Secondary | ICD-10-CM | POA: Diagnosis not present

## 2017-11-02 DIAGNOSIS — E782 Mixed hyperlipidemia: Secondary | ICD-10-CM | POA: Diagnosis not present

## 2017-11-02 DIAGNOSIS — E1149 Type 2 diabetes mellitus with other diabetic neurological complication: Secondary | ICD-10-CM | POA: Diagnosis not present

## 2017-11-02 DIAGNOSIS — Z1389 Encounter for screening for other disorder: Secondary | ICD-10-CM | POA: Diagnosis not present

## 2017-11-02 DIAGNOSIS — R42 Dizziness and giddiness: Secondary | ICD-10-CM | POA: Diagnosis not present

## 2017-11-02 DIAGNOSIS — E1142 Type 2 diabetes mellitus with diabetic polyneuropathy: Secondary | ICD-10-CM | POA: Diagnosis not present

## 2017-11-02 DIAGNOSIS — Z8673 Personal history of transient ischemic attack (TIA), and cerebral infarction without residual deficits: Secondary | ICD-10-CM | POA: Diagnosis not present

## 2017-11-02 DIAGNOSIS — N183 Chronic kidney disease, stage 3 (moderate): Secondary | ICD-10-CM | POA: Diagnosis not present

## 2017-11-02 DIAGNOSIS — Z0000 Encounter for general adult medical examination without abnormal findings: Secondary | ICD-10-CM | POA: Diagnosis not present

## 2017-11-02 DIAGNOSIS — I10 Essential (primary) hypertension: Secondary | ICD-10-CM | POA: Diagnosis not present

## 2017-11-02 DIAGNOSIS — C679 Malignant neoplasm of bladder, unspecified: Secondary | ICD-10-CM | POA: Diagnosis not present

## 2018-02-12 DIAGNOSIS — R0781 Pleurodynia: Secondary | ICD-10-CM | POA: Diagnosis not present

## 2018-02-12 DIAGNOSIS — R1010 Upper abdominal pain, unspecified: Secondary | ICD-10-CM | POA: Diagnosis not present

## 2018-05-07 DIAGNOSIS — E782 Mixed hyperlipidemia: Secondary | ICD-10-CM | POA: Diagnosis not present

## 2018-05-07 DIAGNOSIS — E1142 Type 2 diabetes mellitus with diabetic polyneuropathy: Secondary | ICD-10-CM | POA: Diagnosis not present

## 2018-05-07 DIAGNOSIS — Z8673 Personal history of transient ischemic attack (TIA), and cerebral infarction without residual deficits: Secondary | ICD-10-CM | POA: Diagnosis not present

## 2018-05-07 DIAGNOSIS — E1149 Type 2 diabetes mellitus with other diabetic neurological complication: Secondary | ICD-10-CM | POA: Diagnosis not present

## 2018-05-07 DIAGNOSIS — N183 Chronic kidney disease, stage 3 (moderate): Secondary | ICD-10-CM | POA: Diagnosis not present

## 2018-05-07 DIAGNOSIS — C679 Malignant neoplasm of bladder, unspecified: Secondary | ICD-10-CM | POA: Diagnosis not present

## 2018-05-07 DIAGNOSIS — I10 Essential (primary) hypertension: Secondary | ICD-10-CM | POA: Diagnosis not present

## 2018-05-07 DIAGNOSIS — Z7984 Long term (current) use of oral hypoglycemic drugs: Secondary | ICD-10-CM | POA: Diagnosis not present

## 2018-05-26 DIAGNOSIS — H903 Sensorineural hearing loss, bilateral: Secondary | ICD-10-CM | POA: Diagnosis not present

## 2018-06-14 DIAGNOSIS — J069 Acute upper respiratory infection, unspecified: Secondary | ICD-10-CM | POA: Diagnosis not present

## 2018-06-14 DIAGNOSIS — R05 Cough: Secondary | ICD-10-CM | POA: Diagnosis not present

## 2018-11-09 DIAGNOSIS — Z23 Encounter for immunization: Secondary | ICD-10-CM | POA: Diagnosis not present

## 2019-01-20 ENCOUNTER — Encounter: Payer: Self-pay | Admitting: Podiatry

## 2019-01-20 ENCOUNTER — Other Ambulatory Visit: Payer: Self-pay | Admitting: Podiatry

## 2019-01-20 ENCOUNTER — Ambulatory Visit (INDEPENDENT_AMBULATORY_CARE_PROVIDER_SITE_OTHER): Payer: Medicare HMO

## 2019-01-20 ENCOUNTER — Ambulatory Visit: Payer: Medicare HMO | Admitting: Podiatry

## 2019-01-20 VITALS — BP 146/74 | HR 72 | Resp 16

## 2019-01-20 DIAGNOSIS — M25572 Pain in left ankle and joints of left foot: Secondary | ICD-10-CM

## 2019-01-20 DIAGNOSIS — M1000 Idiopathic gout, unspecified site: Secondary | ICD-10-CM | POA: Diagnosis not present

## 2019-01-20 DIAGNOSIS — M779 Enthesopathy, unspecified: Secondary | ICD-10-CM | POA: Diagnosis not present

## 2019-01-20 MED ORDER — TRIAMCINOLONE ACETONIDE 10 MG/ML IJ SUSP
10.0000 mg | Freq: Once | INTRAMUSCULAR | Status: AC
Start: 2019-01-20 — End: 2019-01-20
  Administered 2019-01-20: 10 mg

## 2019-01-20 NOTE — Progress Notes (Signed)
   Subjective:    Patient ID: Henry Wiggins, male    DOB: [birth year], 85 y.o.   MRN: [id number]  HPI    Review of Systems  All other systems reviewed and are negative.      Objective:   Physical Exam        Assessment & Plan:

## 2019-01-20 NOTE — Patient Instructions (Signed)

## 2019-01-21 NOTE — Progress Notes (Signed)
Subjective:   Patient ID: Henry Wiggins, male   DOB: 85 y.o.   MRN: [id number]   HPI Patient states he is developed a lot of pain around the big toe joint of his left foot and it is red and inflamed and he just has had a lot of stress in his life as his wife passed away and this occurred since that happened.  Patient does not smoke and likes to be active   Review of Systems  All other systems reviewed and are negative.       Objective:  Physical Exam Vitals signs and nursing note reviewed.  Constitutional:      Appearance: He is well-developed.  Pulmonary:     Effort: Pulmonary effort is normal.  Musculoskeletal: Normal range of motion.  Skin:    General: Skin is warm.  Neurological:     Mental Status: He is alert.     Vascular status moderately diminished with patient having no indications of claudication symptoms.  Patient was noted to have diminished range of motion subtalar midtarsal joint and does have inflammation and redness around the first MPJ left foot that he is never had previously.  It is very sore when palpated and it is making it hard for him to wear shoe gear or walk comfortably.  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  Hopefully this will be a one-time event  X-rays indicate that there is no lysis but I did note some density around the first metatarsal left with osteoporosis and arthritis commensurate with age

## 2019-07-08 DIAGNOSIS — I10 Essential (primary) hypertension: Secondary | ICD-10-CM | POA: Diagnosis not present

## 2019-07-08 DIAGNOSIS — N183 Chronic kidney disease, stage 3 (moderate): Secondary | ICD-10-CM | POA: Diagnosis not present

## 2019-07-08 DIAGNOSIS — E1142 Type 2 diabetes mellitus with diabetic polyneuropathy: Secondary | ICD-10-CM | POA: Diagnosis not present

## 2019-07-08 DIAGNOSIS — E1149 Type 2 diabetes mellitus with other diabetic neurological complication: Secondary | ICD-10-CM | POA: Diagnosis not present

## 2019-07-08 DIAGNOSIS — Z8673 Personal history of transient ischemic attack (TIA), and cerebral infarction without residual deficits: Secondary | ICD-10-CM | POA: Diagnosis not present

## 2019-07-08 DIAGNOSIS — H9193 Unspecified hearing loss, bilateral: Secondary | ICD-10-CM | POA: Diagnosis not present

## 2019-07-08 DIAGNOSIS — E782 Mixed hyperlipidemia: Secondary | ICD-10-CM | POA: Diagnosis not present

## 2019-07-08 DIAGNOSIS — C679 Malignant neoplasm of bladder, unspecified: Secondary | ICD-10-CM | POA: Diagnosis not present

## 2019-08-04 DIAGNOSIS — E1149 Type 2 diabetes mellitus with other diabetic neurological complication: Secondary | ICD-10-CM | POA: Diagnosis not present

## 2019-08-04 DIAGNOSIS — E782 Mixed hyperlipidemia: Secondary | ICD-10-CM | POA: Diagnosis not present

## 2019-08-09 ENCOUNTER — Other Ambulatory Visit: Payer: Self-pay

## 2019-08-09 ENCOUNTER — Other Ambulatory Visit: Payer: Self-pay | Admitting: Family Medicine

## 2019-08-09 ENCOUNTER — Ambulatory Visit
Admission: RE | Admit: 2019-08-09 | Discharge: 2019-08-09 | Disposition: A | Discharge status: Home / Self Care | Payer: Medicare HMO | Source: Ambulatory Visit | Attending: Family Medicine | Admitting: Family Medicine

## 2019-08-09 DIAGNOSIS — E1149 Type 2 diabetes mellitus with other diabetic neurological complication: Secondary | ICD-10-CM | POA: Diagnosis not present

## 2019-08-09 DIAGNOSIS — N183 Chronic kidney disease, stage 3 (moderate): Secondary | ICD-10-CM | POA: Diagnosis not present

## 2019-08-09 DIAGNOSIS — R059 Cough, unspecified: Secondary | ICD-10-CM

## 2019-08-09 DIAGNOSIS — R05 Cough: Secondary | ICD-10-CM

## 2019-08-09 DIAGNOSIS — E782 Mixed hyperlipidemia: Secondary | ICD-10-CM | POA: Diagnosis not present

## 2019-08-09 DIAGNOSIS — Z8673 Personal history of transient ischemic attack (TIA), and cerebral infarction without residual deficits: Secondary | ICD-10-CM | POA: Diagnosis not present

## 2019-08-09 DIAGNOSIS — L989 Disorder of the skin and subcutaneous tissue, unspecified: Secondary | ICD-10-CM | POA: Diagnosis not present

## 2019-08-09 DIAGNOSIS — H9193 Unspecified hearing loss, bilateral: Secondary | ICD-10-CM | POA: Diagnosis not present

## 2019-08-09 DIAGNOSIS — I10 Essential (primary) hypertension: Secondary | ICD-10-CM | POA: Diagnosis not present

## 2019-08-09 DIAGNOSIS — C679 Malignant neoplasm of bladder, unspecified: Secondary | ICD-10-CM | POA: Diagnosis not present

## 2019-08-09 DIAGNOSIS — E1142 Type 2 diabetes mellitus with diabetic polyneuropathy: Secondary | ICD-10-CM | POA: Diagnosis not present

## 2019-09-22 DIAGNOSIS — C44319 Basal cell carcinoma of skin of other parts of face: Secondary | ICD-10-CM | POA: Diagnosis not present

## 2019-09-24 DIAGNOSIS — Z79899 Other long term (current) drug therapy: Secondary | ICD-10-CM | POA: Diagnosis not present

## 2019-09-24 DIAGNOSIS — E119 Type 2 diabetes mellitus without complications: Secondary | ICD-10-CM | POA: Diagnosis not present

## 2019-09-24 DIAGNOSIS — Z1159 Encounter for screening for other viral diseases: Secondary | ICD-10-CM | POA: Diagnosis not present

## 2019-09-24 DIAGNOSIS — L989 Disorder of the skin and subcutaneous tissue, unspecified: Secondary | ICD-10-CM | POA: Diagnosis not present

## 2019-10-12 DIAGNOSIS — C44329 Squamous cell carcinoma of skin of other parts of face: Secondary | ICD-10-CM | POA: Diagnosis not present

## 2019-10-12 DIAGNOSIS — Z85828 Personal history of other malignant neoplasm of skin: Secondary | ICD-10-CM | POA: Diagnosis not present

## 2019-10-26 DIAGNOSIS — R69 Illness, unspecified: Secondary | ICD-10-CM | POA: Diagnosis not present

## 2019-10-31 ENCOUNTER — Ambulatory Visit (INDEPENDENT_AMBULATORY_CARE_PROVIDER_SITE_OTHER): Payer: Medicare HMO | Admitting: Podiatry

## 2019-10-31 ENCOUNTER — Other Ambulatory Visit: Payer: Self-pay

## 2019-10-31 DIAGNOSIS — I739 Peripheral vascular disease, unspecified: Secondary | ICD-10-CM | POA: Diagnosis not present

## 2019-10-31 DIAGNOSIS — L97512 Non-pressure chronic ulcer of other part of right foot with fat layer exposed: Secondary | ICD-10-CM

## 2019-10-31 DIAGNOSIS — L97522 Non-pressure chronic ulcer of other part of left foot with fat layer exposed: Secondary | ICD-10-CM

## 2019-11-01 ENCOUNTER — Telehealth: Payer: Self-pay | Admitting: *Deleted

## 2019-11-01 DIAGNOSIS — I739 Peripheral vascular disease, unspecified: Secondary | ICD-10-CM

## 2019-11-01 DIAGNOSIS — L97512 Non-pressure chronic ulcer of other part of right foot with fat layer exposed: Secondary | ICD-10-CM

## 2019-11-01 DIAGNOSIS — L97522 Non-pressure chronic ulcer of other part of left foot with fat layer exposed: Secondary | ICD-10-CM

## 2019-11-01 NOTE — Telephone Encounter (Signed)
Faxed orders to [facility]. 

## 2019-11-01 NOTE — Telephone Encounter (Signed)
-----   Message from Edrick Kins, DPM sent at 10/31/2019  4:28 PM EST ----- Regarding: arterial doppler b/l lower extremities Please order arterial dopplers bilateral lower extremities.  Dx: PVD B/L  - Dr. Amalia Hailey

## 2019-11-03 NOTE — Progress Notes (Signed)
   HPI: 85 y.o. male presenting today with a chief complaint of ulcerations of the bilateral feet. He states the left 2nd toe wound appeared three weeks ago and the right great toe wound appeared five weeks ago. He states the wounds have not changed since they first appeared. He reports finishing a course of Doxycycline a few weeks ago. He denies modifying factors. Patient is here for further evaluation and treatment.   Past Medical History:  Diagnosis Date  . Bladder cancer (HCC)    Dr. Janice Norrie  . Diabetes ([place])   . Hyperlipidemia   . Hypertension   . Stroke ([place])   . UTI (lower urinary tract infection)      Physical Exam: General: The patient is alert and oriented x3 in no acute distress.  Dermatology: Wound #1 noted to the right great toe measuring 0.8 x 0.8 x 0.1 cm.   Wound #2 noted to the left 2nd toe measuring 0.5 x 0.5 x 0.2 cm.   To the above-noted ulceration, there is no eschar. There is a moderate amount of slough, fibrin and necrotic tissue. Granulation tissue and wound base is red. There is no malodor. There is a minimal amount of serosanginous drainage noted. Periwound integrity is intact.  Skin is warm, dry and supple bilateral lower extremities.   Vascular: Palpable pedal pulses diminished. No edema or erythema noted. Capillary refill within normal limits.  Neurological: Epicritic and protective threshold grossly intact bilaterally.   Musculoskeletal Exam: Range of motion within normal limits to all pedal and ankle joints bilateral. Muscle strength 5/5 in all groups bilateral.    Assessment: 1. Ulceration of the right great toe 2. Ulceration of the left 2nd toe 3. PVD BLE   Plan of Care:  1. Patient evaluated.  2. Medically necessary excisional debridement including subcutaneous tissue was performed using a tissue nipper and a chisel blade. Excisional debridement of all the necrotic nonviable tissue down to healthy bleeding viable tissue was performed with  post-debridement measurements same as pre-. 3. The wound was cleansed and dry sterile dressing applied. 4. Recommended Betadine daily with a bandage.  5. Silicone toe spacers provided.  6. Order for arterial dopplers BLE.  7. Return to clinic in 4 weeks.        Edrick Kins, DPM [facility]  Dr. Edrick Kins, DPM    [address], [place] [address]                Office [phone]  Fax [phone]

## 2019-11-09 ENCOUNTER — Ambulatory Visit (HOSPITAL_COMMUNITY)
Admission: RE | Admit: 2019-11-09 | Discharge: 2019-11-09 | Disposition: A | Discharge status: Home / Self Care | Payer: Medicare HMO | Source: Ambulatory Visit | Attending: Cardiology | Admitting: Cardiology

## 2019-11-09 ENCOUNTER — Other Ambulatory Visit: Payer: Self-pay

## 2019-11-09 DIAGNOSIS — L97522 Non-pressure chronic ulcer of other part of left foot with fat layer exposed: Secondary | ICD-10-CM | POA: Diagnosis not present

## 2019-11-09 DIAGNOSIS — I739 Peripheral vascular disease, unspecified: Secondary | ICD-10-CM | POA: Diagnosis not present

## 2019-11-09 DIAGNOSIS — L97512 Non-pressure chronic ulcer of other part of right foot with fat layer exposed: Secondary | ICD-10-CM | POA: Insufficient documentation

## 2019-12-05 ENCOUNTER — Telehealth: Payer: Self-pay | Admitting: *Deleted

## 2019-12-05 ENCOUNTER — Ambulatory Visit (INDEPENDENT_AMBULATORY_CARE_PROVIDER_SITE_OTHER): Payer: Medicare HMO | Admitting: Podiatry

## 2019-12-05 ENCOUNTER — Other Ambulatory Visit: Payer: Self-pay

## 2019-12-05 DIAGNOSIS — I739 Peripheral vascular disease, unspecified: Secondary | ICD-10-CM

## 2019-12-05 DIAGNOSIS — L97512 Non-pressure chronic ulcer of other part of right foot with fat layer exposed: Secondary | ICD-10-CM

## 2019-12-05 DIAGNOSIS — L97522 Non-pressure chronic ulcer of other part of left foot with fat layer exposed: Secondary | ICD-10-CM

## 2019-12-05 NOTE — Telephone Encounter (Signed)
-----   Message from Edrick Kins, DPM sent at 12/05/2019  1:49 PM EST ----- Regarding: referral to vein and vascular Please refer to [facility]. Dx: PVD bilateral. Abnormal ABIs. Ulcers b/l feet  Thanks, Dr. Amalia Hailey

## 2019-12-06 NOTE — Telephone Encounter (Signed)
Faxed required form, clinical 10/31/2019, demographics to [facility].

## 2019-12-06 NOTE — Progress Notes (Signed)
   HPI: 85 y.o. male presenting today with a chief complaint of ulcerations of the bilateral feet.  The patient's daughter states that the ulcers to the bilateral feet have been about the same or slightly bigger.  No changes since last visit on 10/31/2019.  At last visit arterial Dopplers were ordered for the bilateral lower extremities.  Past Medical History:  Diagnosis Date  . Bladder cancer (HCC)    Dr. Janice Norrie  . Diabetes ([place])   . Hyperlipidemia   . Hypertension   . Stroke ([place])   . UTI (lower urinary tract infection)      Physical Exam: General: The patient is alert and oriented x3 in no acute distress.  Dermatology: Wound #1 noted to the right great toe measuring 0.8 x 0.8 x 0.1 cm.  Mostly unchanged since last visit  Wound #2 noted to the left 2nd toe measuring 0.5 x 0.5 x 0.2 cm.  Mostly unchanged since last visit   To the above-noted ulceration, there is no eschar. There is a moderate amount of slough, fibrin and necrotic tissue. Granulation tissue and wound base is red. There is no malodor. There is a minimal amount of serosanginous drainage noted. Periwound integrity is intact.  Skin is warm, dry and supple bilateral lower extremities.   Vascular: pedal pulses diminished. No edema or erythema noted. Capillary refill within normal limits.  Neurological: Epicritic and protective threshold grossly intact bilaterally.   Musculoskeletal Exam: Range of motion within normal limits to all pedal and ankle joints bilateral. Muscle strength 5/5 in all groups bilateral.    Assessment: 1. Ulceration of the right great toe 2. Ulceration of the left 2nd toe 3. PVD BLE   Plan of Care:  1. Patient evaluated.  Arterial Doppler studies were reviewed today with the patient and the daughter.  I explained that without vital blood supply the wounds may likely progress and wound healing is definitely impaired because of the lack of blood flow. 2.  Continue Betadine and with bandage daily 3.   Referral placed today for vascular consult, [facility] 4.  Return to clinic in 4 weeks       Edrick Kins, DPM [facility]  Dr. Edrick Kins, DPM    [address], [place] [address]                Office [phone]  Fax [phone]

## 2019-12-07 ENCOUNTER — Encounter: Payer: Medicare HMO | Admitting: Vascular Surgery

## 2019-12-09 ENCOUNTER — Other Ambulatory Visit: Payer: Self-pay

## 2019-12-09 ENCOUNTER — Ambulatory Visit (INDEPENDENT_AMBULATORY_CARE_PROVIDER_SITE_OTHER): Payer: Medicare HMO | Admitting: Vascular Surgery

## 2019-12-09 ENCOUNTER — Encounter: Payer: Self-pay | Admitting: Vascular Surgery

## 2019-12-09 VITALS — BP 152/80 | HR 78 | Temp 98.1°F | Resp 20 | Ht 69.0 in | Wt 199.8 lb

## 2019-12-09 DIAGNOSIS — I739 Peripheral vascular disease, unspecified: Secondary | ICD-10-CM

## 2019-12-09 NOTE — Progress Notes (Signed)
Patient ID: Henry Wiggins, male   DOB: 12/30/1926, 85 y.o.   MRN: [id number]  Reason for Consult: No chief complaint on file.   Referred by Antony Contras, MD  Subjective:     HPI:  Henry Wiggins is a 85 y.o. male history of bilateral ulcerations has been seen by podiatry.  Since being followed by Dr. Amalia Hailey states that the wounds have been getting somewhat better.  He does have pain in his feet as well right greater than left.  Has never had vascular intervention before.  Does not have any fevers or chills.  Risk factors for vascular disease include former smoker, hypertension, hyperlipidemia, diabetes.  Past Medical History:  Diagnosis Date  . Bladder cancer (HCC)    Dr. Janice Norrie  . Diabetes ([place])   . Hyperlipidemia   . Hypertension   . Stroke ([place])   . UTI (lower urinary tract infection)    Family History  Problem Relation Age of Onset  . Hypertension Mother   . Hypertension Father    Past Surgical History:  Procedure Laterality Date  . APPENDECTOMY    . CARPAL TUNNEL RELEASE Right   . CATARACT EXTRACTION Bilateral   . CHOLECYSTECTOMY    . EP IMPLANTABLE DEVICE N/A 05/28/2015   Procedure: Loop Recorder Insertion;  Surgeon: Sanda Klein, MD;  Location: [facility];  Service: Cardiovascular;  Laterality: N/A;  . HERNIA REPAIR     x4  . TEE WITHOUT CARDIOVERSION N/A 05/28/2015   Procedure: TRANSESOPHAGEAL ECHOCARDIOGRAM (TEE);  Surgeon: Sanda Klein, MD;  Location: [facility] ENDOSCOPY;  Service: Cardiovascular;  Laterality: N/A;  . TONSILLECTOMY      Short Social History:  Social History   Tobacco Use  . Smoking status: Former Smoker    Packs/day: 0.50    Years: 5.00    Pack years: 2.50    Types: Cigarettes    Quit date: 12/22/1953    Years since quitting: 66.0  . Smokeless tobacco: Never Used  Substance Use Topics  . Alcohol use: No    Alcohol/week: 0.0 standard drinks    No Known Allergies  Current Outpatient Medications  Medication Sig Dispense Refill    . amLODipine (NORVASC) 10 MG tablet     . doxycycline (VIBRAMYCIN) 100 MG capsule Take 100 mg by mouth 2 (two) times daily.    Marland Kitchen losartan-hydrochlorothiazide (HYZAAR) 100-25 MG per tablet Take 1 tablet by mouth daily. Hold for 1 week.    . metFORMIN (GLUCOPHAGE) 1000 MG tablet Take 1 tablet by mouth 2 (two) times daily.    . mupirocin ointment (BACTROBAN) 2 % APPLY TO AFFECTED AREA 4 TIMES A DAY *INS MAX 1 TUBE PER 30 DAYS*     No current facility-administered medications for this visit.    Review of Systems  Constitutional:  Constitutional negative. HENT: HENT negative.  Eyes: Eyes negative.  Respiratory: Respiratory negative.  Cardiovascular: Cardiovascular negative.  GI: Gastrointestinal negative.  Musculoskeletal: Positive for leg pain.  Skin: Positive for wound.  Neurological: Neurological negative. Hematologic: Hematologic/lymphatic negative.  Psychiatric: Psychiatric negative.        Objective:  Objective  Vitals:   12/09/19 1226  BP: (!) 152/80  Pulse: 78  Resp: 20  Temp: 98.1 F (36.7 C)  SpO2: 97%    There were no vitals filed for this visit. There is no height or weight on file to calculate BMI.    Physical Exam HENT:     Head: Normocephalic.     Mouth/Throat:  Mouth: Mucous membranes are moist.  Eyes:     Pupils: Pupils are equal, round, and reactive to light.  Cardiovascular:     Rate and Rhythm: Normal rate.     Pulses:          Femoral pulses are 2+ on the right side and 2+ on the left side.      Popliteal pulses are 1+ on the right side and 0 on the left side.       Dorsalis pedis pulses are 0 on the right side and 0 on the left side.       Posterior tibial pulses are 0 on the right side and 0 on the left side.  Pulmonary:     Effort: Pulmonary effort is normal.  Abdominal:     General: Abdomen is flat.     Palpations: Abdomen is soft. There is no mass.  Musculoskeletal:     Right lower leg: Edema present.     Comments: Toe ulceration  right great toe and left second toe no evidence of infection  Skin:    Capillary Refill: Capillary refill takes more than 3 seconds.  Neurological:     General: No focal deficit present.     Mental Status: He is alert.  Psychiatric:        Mood and Affect: Mood normal.        Thought Content: Thought content normal.        Judgment: Judgment normal.     Data:      Assessment/Plan:     85 year old male with bilateral toe ulceration followed by podiatry.  Has moderately depressed ABIs bilaterally.  I discussed with he and his daughter via telephone the need for angiography possible intervention.  We will start with a left common femoral approach to evaluate first the right leg.  Most recent creatinine levels have been normal although this was several years ago that I have documented.  I have asked him to start taking baby aspirin daily.  If we intervene he would need Plavix and statin as well at least for 3 months on the Plavix.  They demonstrate good understanding we will get him scheduled in the very near future.     Waynetta Sandy MD [facility]

## 2019-12-13 NOTE — Telephone Encounter (Signed)
Faxed copy of [facility] form original referral with Addendum clinicals of 12/05/2019 to [facility].

## 2019-12-15 ENCOUNTER — Other Ambulatory Visit (HOSPITAL_COMMUNITY)
Admission: RE | Admit: 2019-12-15 | Discharge: 2019-12-15 | Disposition: A | Discharge status: Home / Self Care | Payer: Medicare HMO | Source: Ambulatory Visit | Attending: Vascular Surgery | Admitting: Vascular Surgery

## 2019-12-15 DIAGNOSIS — Z20828 Contact with and (suspected) exposure to other viral communicable diseases: Secondary | ICD-10-CM | POA: Diagnosis not present

## 2019-12-15 DIAGNOSIS — Z01812 Encounter for preprocedural laboratory examination: Secondary | ICD-10-CM | POA: Insufficient documentation

## 2019-12-16 LAB — NOVEL CORONAVIRUS, NAA (HOSP ORDER, SEND-OUT TO REF LAB; TAT 18-24 HRS): SARS-CoV-2, NAA: NOT DETECTED

## 2019-12-19 ENCOUNTER — Ambulatory Visit (HOSPITAL_COMMUNITY)
Admission: RE | Admit: 2019-12-19 | Discharge: 2019-12-19 | Disposition: A | Discharge status: Home / Self Care | Payer: Medicare HMO | Attending: Vascular Surgery | Admitting: Vascular Surgery

## 2019-12-19 ENCOUNTER — Ambulatory Visit (HOSPITAL_COMMUNITY)
Admission: RE | Disposition: A | Discharge status: Home / Self Care | Payer: Self-pay | Source: Home / Self Care | Attending: Vascular Surgery

## 2019-12-19 DIAGNOSIS — Z8673 Personal history of transient ischemic attack (TIA), and cerebral infarction without residual deficits: Secondary | ICD-10-CM | POA: Diagnosis not present

## 2019-12-19 DIAGNOSIS — I70235 Atherosclerosis of native arteries of right leg with ulceration of other part of foot: Secondary | ICD-10-CM | POA: Diagnosis not present

## 2019-12-19 DIAGNOSIS — E1151 Type 2 diabetes mellitus with diabetic peripheral angiopathy without gangrene: Secondary | ICD-10-CM | POA: Diagnosis not present

## 2019-12-19 DIAGNOSIS — E11621 Type 2 diabetes mellitus with foot ulcer: Secondary | ICD-10-CM | POA: Insufficient documentation

## 2019-12-19 DIAGNOSIS — Z7984 Long term (current) use of oral hypoglycemic drugs: Secondary | ICD-10-CM | POA: Insufficient documentation

## 2019-12-19 DIAGNOSIS — Z79899 Other long term (current) drug therapy: Secondary | ICD-10-CM | POA: Insufficient documentation

## 2019-12-19 DIAGNOSIS — I70245 Atherosclerosis of native arteries of left leg with ulceration of other part of foot: Secondary | ICD-10-CM | POA: Diagnosis not present

## 2019-12-19 DIAGNOSIS — Z87891 Personal history of nicotine dependence: Secondary | ICD-10-CM | POA: Diagnosis not present

## 2019-12-19 DIAGNOSIS — I10 Essential (primary) hypertension: Secondary | ICD-10-CM | POA: Diagnosis not present

## 2019-12-19 DIAGNOSIS — E785 Hyperlipidemia, unspecified: Secondary | ICD-10-CM | POA: Diagnosis not present

## 2019-12-19 DIAGNOSIS — L97529 Non-pressure chronic ulcer of other part of left foot with unspecified severity: Secondary | ICD-10-CM | POA: Diagnosis not present

## 2019-12-19 DIAGNOSIS — L97519 Non-pressure chronic ulcer of other part of right foot with unspecified severity: Secondary | ICD-10-CM | POA: Diagnosis not present

## 2019-12-19 HISTORY — PX: ABDOMINAL AORTOGRAM W/LOWER EXTREMITY: CATH118223

## 2019-12-19 HISTORY — PX: PERIPHERAL VASCULAR BALLOON ANGIOPLASTY: CATH118281

## 2019-12-19 LAB — POCT I-STAT 7, (LYTES, BLD GAS, ICA,H+H)
Acid-base deficit: 3 mmol/L — ABNORMAL HIGH (ref 0.0–2.0)
Bicarbonate: 21.7 mmol/L (ref 20.0–28.0)
Calcium, Ion: 1.22 mmol/L (ref 1.15–1.40)
HCT: 38 % — ABNORMAL LOW (ref 39.0–52.0)
Hemoglobin: 12.9 g/dL — ABNORMAL LOW (ref 13.0–17.0)
O2 Saturation: 98 %
Potassium: 4 mmol/L (ref 3.5–5.1)
Sodium: 138 mmol/L (ref 135–145)
TCO2: 23 mmol/L (ref 22–32)
pCO2 arterial: 35.4 mmHg (ref 32.0–48.0)
pH, Arterial: 7.397 (ref 7.350–7.450)
pO2, Arterial: 100 mmHg (ref 83.0–108.0)

## 2019-12-19 LAB — BASIC METABOLIC PANEL
Anion gap: 11 (ref 5–15)
BUN: 28 mg/dL — ABNORMAL HIGH (ref 8–23)
CO2: 25 mmol/L (ref 22–32)
Calcium: 9.4 mg/dL (ref 8.9–10.3)
Chloride: 102 mmol/L (ref 98–111)
Creatinine, Ser: 1.76 mg/dL — ABNORMAL HIGH (ref 0.61–1.24)
GFR calc Af Amer: 39 mL/min — ABNORMAL LOW (ref >60)
GFR calc non Af Amer: 33 mL/min — ABNORMAL LOW (ref >60)
Glucose, Bld: 189 mg/dL — ABNORMAL HIGH (ref 70–99)
Potassium: 4.4 mmol/L (ref 3.5–5.1)
Sodium: 138 mmol/L (ref 135–145)

## 2019-12-19 LAB — GLUCOSE, CAPILLARY: Glucose-Capillary: 152 mg/dL — ABNORMAL HIGH (ref 70–99)

## 2019-12-19 LAB — POCT ACTIVATED CLOTTING TIME
Activated Clotting Time: 230 seconds
Activated Clotting Time: 197 seconds
Activated Clotting Time: 175 seconds

## 2019-12-19 SURGERY — ABDOMINAL AORTOGRAM W/LOWER EXTREMITY
Anesthesia: LOCAL | Laterality: Right

## 2019-12-19 MED ORDER — SODIUM CHLORIDE 0.9% FLUSH: 3.0000 mL | INTRAVENOUS | Status: DC | PRN

## 2019-12-19 MED ORDER — SODIUM CHLORIDE 0.9 % IV SOLN: INTRAVENOUS | Status: DC

## 2019-12-19 MED ORDER — HEPARIN (PORCINE) IN NACL 1000-0.9 UT/500ML-% IV SOLN
INTRAVENOUS | Status: AC
  Filled 2019-12-19: qty 1000

## 2019-12-19 MED ORDER — SODIUM CHLORIDE 0.9 % IV SOLN: 250.0000 mL | INTRAVENOUS | Status: DC | PRN

## 2019-12-19 MED ORDER — LABETALOL HCL 5 MG/ML IV SOLN
INTRAVENOUS | Status: AC
  Filled 2019-12-19: qty 4

## 2019-12-19 MED ORDER — LIDOCAINE HCL (PF) 1 % IJ SOLN
INTRAMUSCULAR | Status: DC | PRN
  Administered 2019-12-19: 15 mL via INTRADERMAL

## 2019-12-19 MED ORDER — ONDANSETRON HCL 4 MG/2ML IJ SOLN: 4.0000 mg | Freq: Four times a day (QID) | INTRAMUSCULAR | Status: DC | PRN

## 2019-12-19 MED ORDER — SODIUM CHLORIDE 0.9% FLUSH: 3.0000 mL | Freq: Two times a day (BID) | INTRAVENOUS | Status: DC

## 2019-12-19 MED ORDER — MORPHINE SULFATE (PF) 2 MG/ML IV SOLN: 2.0000 mg | INTRAVENOUS | Status: DC | PRN

## 2019-12-19 MED ORDER — IODIXANOL 320 MG/ML IV SOLN
INTRAVENOUS | Status: DC | PRN
  Administered 2019-12-19: 15:00:00 60 mL via INTRA_ARTERIAL

## 2019-12-19 MED ORDER — SODIUM CHLORIDE 0.9 % IV SOLN: INTRAVENOUS | Status: AC

## 2019-12-19 MED ORDER — LIDOCAINE HCL (PF) 1 % IJ SOLN
INTRAMUSCULAR | Status: AC
  Filled 2019-12-19: qty 30

## 2019-12-19 MED ORDER — ACETAMINOPHEN 325 MG PO TABS: 650.0000 mg | ORAL_TABLET | ORAL | Status: DC | PRN

## 2019-12-19 MED ORDER — HEPARIN SODIUM (PORCINE) 1000 UNIT/ML IJ SOLN
INTRAMUSCULAR | Status: AC
  Filled 2019-12-19: qty 1

## 2019-12-19 MED ORDER — HYDRALAZINE HCL 20 MG/ML IJ SOLN
INTRAMUSCULAR | Status: AC
  Filled 2019-12-19: qty 1

## 2019-12-19 MED ORDER — LABETALOL HCL 5 MG/ML IV SOLN
10.0000 mg | INTRAVENOUS | Status: DC | PRN
  Administered 2019-12-19 (×2): 10 mg via INTRAVENOUS

## 2019-12-19 MED ORDER — OXYCODONE HCL 5 MG PO TABS
5.0000 mg | ORAL_TABLET | ORAL | Status: DC | PRN
  Administered 2019-12-19: 5 mg via ORAL
  Filled 2019-12-19: qty 1

## 2019-12-19 MED ORDER — HEPARIN SODIUM (PORCINE) 1000 UNIT/ML IJ SOLN
INTRAMUSCULAR | Status: DC | PRN
  Administered 2019-12-19: 9000 [IU] via INTRAVENOUS

## 2019-12-19 MED ORDER — HYDRALAZINE HCL 20 MG/ML IJ SOLN: 5.0000 mg | INTRAMUSCULAR | Status: DC | PRN

## 2019-12-19 MED ORDER — HYDRALAZINE HCL 20 MG/ML IJ SOLN
INTRAMUSCULAR | Status: DC | PRN
  Administered 2019-12-19: 10 mg via INTRAVENOUS

## 2019-12-19 MED ORDER — FENTANYL CITRATE (PF) 100 MCG/2ML IJ SOLN
INTRAMUSCULAR | Status: AC
  Filled 2019-12-19: qty 2

## 2019-12-19 MED ORDER — HEPARIN (PORCINE) IN NACL 1000-0.9 UT/500ML-% IV SOLN
INTRAVENOUS | Status: DC | PRN
  Administered 2019-12-19 (×2): 500 mL

## 2019-12-19 MED ORDER — FENTANYL CITRATE (PF) 100 MCG/2ML IJ SOLN
INTRAMUSCULAR | Status: DC | PRN
  Administered 2019-12-19 (×2): 25 ug via INTRAVENOUS

## 2019-12-19 SURGICAL SUPPLY — 20 items
CATH CROSS OVER TEMPO 5F (CATHETERS) ×3 IMPLANT
CATH CXI SUPP 2.6F 150 ANG (CATHETERS) ×3 IMPLANT
CATH OMNI FLUSH 5F 65CM (CATHETERS) ×3 IMPLANT
CATH QUICKCROSS SUPP .035X90CM (MICROCATHETER) ×3 IMPLANT
COVER DOME SNAP 22 D (MISCELLANEOUS) ×3 IMPLANT
DEVICE TORQUE .025-.038 (MISCELLANEOUS) ×3 IMPLANT
FILTER CO2 0.2 MICRON (VASCULAR PRODUCTS) ×3 IMPLANT
GLIDEWIRE ADV .035X260CM (WIRE) ×3 IMPLANT
KIT MICROPUNCTURE NIT STIFF (SHEATH) ×3 IMPLANT
KIT PV (KITS) ×3 IMPLANT
RESERVOIR CO2 (VASCULAR PRODUCTS) ×3 IMPLANT
SET FLUSH CO2 (MISCELLANEOUS) ×3 IMPLANT
SHEATH FLEX ANSEL ANG 6F 45CM (SHEATH) ×3 IMPLANT
SHEATH PINNACLE 5F 10CM (SHEATH) ×3 IMPLANT
SHEATH PROBE COVER 6X72 (BAG) ×3 IMPLANT
SYR MEDRAD MARK V 150ML (SYRINGE) ×3 IMPLANT
TRANSDUCER W/STOPCOCK (MISCELLANEOUS) ×3 IMPLANT
TRAY PV CATH (CUSTOM PROCEDURE TRAY) ×3 IMPLANT
WIRE BENTSON .035X145CM (WIRE) ×3 IMPLANT
WIRE G V18X300CM (WIRE) ×6 IMPLANT

## 2019-12-19 NOTE — Progress Notes (Signed)
Site area: left groin  Site Prior to Removal:  Level 0  Pressure Applied For 25 MINUTES    Minutes Beginning at 1614  Manual:   Yes.    Patient Status During Pull:  Stable  Post Pull Groin Site:  Level 0  Post Pull Instructions Given:  Yes.    Post Pull Pulses Present:  Yes.    Dressing Applied:  Yes.    Comments:  Bed rest started at 1630 Sheath was pulled by Suella Broad RN.

## 2019-12-19 NOTE — Op Note (Signed)
    Patient name: KAVEON BLATZ MRN: [id number] DOB: 1927-04-12 Sex: male  12/19/2019 Pre-operative Diagnosis: Critical bilateral lower extremity ischemia with toe ulceration Post-operative diagnosis:  Same Surgeon:  Erlene Quan C. Donzetta Matters, MD Procedure Performed: 1.  Ultrasound-guided cannulation left common femoral artery 2.  CO2 aortogram with bilateral lower extremity runoff 3.  Selection of right anterior tibial artery and contrasted angiography below the knee  Indications: 85 year old male with bilateral toe ulceration.  He has no previous vascular invention.  He does have previous history of elevated creatinine.  He is indicated for aortogram possible intervention on the right first.  Findings: The aorta and iliac segments are calcified heavily tortuous.  Bilateral SFAs are calcified.  Below the knee was not visualized on the left side given CO2.  On the right side the SFA there is a proximal 90% stenosis distal SFA with multiple approximate 50% stenosis throughout.  Below the knee there is an anterior tibial artery that stops in the mid calf.  Peroneal artery is occluded does reconstitute in the mid calf does not appear to run to the foot.  There is no visualized posterior tibial artery.  I attempted to cross the anterior tibial artery but there was no flow distally.  I attempted to cross the peroneal artery but I did have a perforation and it did not appear to have distal reconstitution.    Patient does not have revascularization issues on the right.  He will be scheduled for angiography possible intervention on the left in the near future.   Procedure:  The patient was identified in the holding area and taken to room 8.  The patient was then placed supine on the table and prepped and draped in the usual sterile fashion.  A time out was called.  Ultrasound was used to evaluate the left common femoral artery.  This was heavily calcified although patent.  There is anesthetized 1% lidocaine  cannulated with direct ultrasound visualization with micropuncture needle followed by wire and sheath.  Images saved the permanent record.  A 5 French sheath was placed followed by Omni catheter over Bentson wire to the level of L1.  CO2 aortogram was performed.  Catheter was pulled to the bifurcation and limited bilateral lower extremity angiography was performed.  We then crossed bifurcation to the right common femoral artery perform CO2 angiography.  There appeared to be a tight stenosis of the right SFA.  We are able to place a 6 French sheath into the right common femoral artery over a stiff wire.  Patient was fully heparinized.  We then crossed through the SFA down to below the knee and performed contrasted angiography.  I did take a V 18 wire attempt across to the anterior tibial artery but unfortunately there was no reconstitution distally.  I then attempted to cross the peroneal artery but this also had minimal reconstitution distally.  Patient appears to have minimal flow into the foot does not have any revascularization options.  Catheters and wires were removed and the sheath was retracted in the left external iliac artery and will be pulled in postoperative holding.  We will schedule patient for left lower extremity angiography in the near future.  Contrast: 60cc   Verlin Uher C. Donzetta Matters, MD [facility] [place] Office: [phone] Pager: [phone]

## 2019-12-19 NOTE — Progress Notes (Signed)
No bleeding or hematoma noted after ambulation 

## 2019-12-19 NOTE — Discharge Instructions (Signed)
Femoral Site Care °This sheet gives you information about how to care for yourself after your procedure. Your health care provider may also give you more specific instructions. If you have problems or questions, contact your health care provider. °What can I expect after the procedure? °After the procedure, it is common to have: °· Bruising that usually fades within 1-2 weeks. °· Tenderness at the site. °Follow these instructions at home: °Wound care °· Follow instructions from your health care provider about how to take care of your insertion site. Make sure you: °? Wash your hands with soap and water before you change your bandage (dressing). If soap and water are not available, use hand sanitizer. °? Change your dressing as told by your health care provider. °? Leave stitches (sutures), skin glue, or adhesive strips in place. These skin closures may need to stay in place for 2 weeks or longer. If adhesive strip edges start to loosen and curl up, you may trim the loose edges. Do not remove adhesive strips completely unless your health care provider tells you to do that. °· Do not take baths, swim, or use a hot tub until your health care provider approves. °· You may shower 24-48 hours after the procedure or as told by your health care provider. °? Gently wash the site with plain soap and water. °? Pat the area dry with a clean towel. °? Do not rub the site. This may cause bleeding. °· Do not apply powder or lotion to the site. Keep the site clean and dry. °· Check your femoral site every day for signs of infection. Check for: °? Redness, swelling, or pain. °? Fluid or blood. °? Warmth. °? Pus or a bad smell. °Activity °· For the first 2-3 days after your procedure, or as long as directed: °? Avoid climbing stairs as much as possible. °? Do not squat. °· Do not lift anything that is heavier than 10 lb (4.5 kg), or the limit that you are told, until your health care provider says that it is safe. °· Rest as  directed. °? Avoid sitting for a long time without moving. Get up to take short walks every 1-2 hours. °· Do not drive for 24 hours if you were given a medicine to help you relax (sedative). °General instructions °· Take over-the-counter and prescription medicines only as told by your health care provider. °· Keep all follow-up visits as told by your health care provider. This is important. °Contact a health care provider if you have: °· A fever or chills. °· You have redness, swelling, or pain around your insertion site. °Get help right away if: °· The catheter insertion area swells very fast. °· You pass out. °· You suddenly start to sweat or your skin gets clammy. °· The catheter insertion area is bleeding, and the bleeding does not stop when you hold steady pressure on the area. °· The area near or just beyond the catheter insertion site becomes pale, cool, tingly, or numb. °These symptoms may represent a serious problem that is an emergency. Do not wait to see if the symptoms will go away. Get medical help right away. Call your local emergency services (911 in the [place] Do not drive yourself to the hospital. °Summary °· After the procedure, it is common to have bruising that usually fades within 1-2 weeks. °· Check your femoral site every day for signs of infection. °· Do not lift anything that is heavier than 10 lb (4.5 kg), or the   limit that you are told, until your health care provider says that it is safe. °This information is not intended to replace advice given to you by your health care provider. Make sure you discuss any questions you have with your health care provider. °Document Released: 08/11/2014 Document Revised: 12/21/2017 Document Reviewed: 12/21/2017 °Elsevier Patient Education © 2020 [employer] ° °

## 2019-12-19 NOTE — H&P (Signed)
   History and Physical Update  The patient was interviewed and re-examined.  The patient's previous History and Physical has been reviewed and is unchanged from recent office visit. Plan for aortogram and possible intervention on the right.   Yan Pankratz C. Donzetta Matters, MD [facility] [place] Office: [phone] Pager: [phone]   12/19/2019, 11:03 AM '

## 2019-12-20 MED FILL — Heparin Sod (Porcine)-NaCl IV Soln 1000 Unit/500ML-0.9%: INTRAVENOUS | Qty: 500 | Status: AC

## 2019-12-22 LAB — POCT I-STAT, CHEM 8
BUN: 49 mg/dL — ABNORMAL HIGH (ref 8–23)
Calcium, Ion: 1.1 mmol/L — ABNORMAL LOW (ref 1.15–1.40)
Chloride: 106 mmol/L (ref 98–111)
Creatinine, Ser: 1.5 mg/dL — ABNORMAL HIGH (ref 0.61–1.24)
Glucose, Bld: 193 mg/dL — ABNORMAL HIGH (ref 70–99)
HCT: 42 % (ref 39.0–52.0)
Hemoglobin: 14.3 g/dL (ref 13.0–17.0)
Potassium: 8.2 mmol/L (ref 3.5–5.1)
Sodium: 135 mmol/L (ref 135–145)
TCO2: 29 mmol/L (ref 22–32)

## 2019-12-27 ENCOUNTER — Telehealth: Payer: Self-pay | Admitting: Surgery

## 2019-12-27 ENCOUNTER — Other Ambulatory Visit: Payer: Self-pay | Admitting: Physician Assistant

## 2019-12-27 MED ORDER — OXYCODONE-ACETAMINOPHEN 5-325 MG PO TABS: 1.0000 | ORAL_TABLET | ORAL | 0 refills | Status: DC | PRN

## 2019-12-27 NOTE — Telephone Encounter (Signed)
I spoke with the patient's daughter tonight.  He had a angio with Donzetta Matters last Friday.  At angio, there were no options for revascularization.  Since the procedure, he has been having sever leg pain.  The daughter is now having to help him goto the bathroom.  HE has lost his dignity and will to live because of the pain.  According to her, the leg and foot are warm.  She says the leg is swollen, but does not think that there is a big hematoma below the knee.  There is no ecchymosis.  It does not sound like a compartment syndrome.  I told her that she could come to the ER, but because of the COVID situation and the fact that is has been going on since last Friday, without any change, she could probably come to the office tomorrow for evaluation.  She will call the office in the am.  He should be seen in the morning.  I did call in percocet to help with the pain overnight.  I f anything changes, she knows to call me back and we can consider going to the ER  Annamarie Major

## 2019-12-28 ENCOUNTER — Encounter (HOSPITAL_COMMUNITY): Payer: Self-pay | Admitting: Vascular Surgery

## 2019-12-28 ENCOUNTER — Ambulatory Visit (INDEPENDENT_AMBULATORY_CARE_PROVIDER_SITE_OTHER): Payer: Medicare HMO | Admitting: Vascular Surgery

## 2019-12-28 ENCOUNTER — Inpatient Hospital Stay: Admission: AD | Admit: 2019-12-28 | Payer: Medicare HMO | Source: Ambulatory Visit | Admitting: Vascular Surgery

## 2019-12-28 ENCOUNTER — Other Ambulatory Visit: Payer: Self-pay

## 2019-12-28 ENCOUNTER — Other Ambulatory Visit: Payer: Self-pay | Admitting: Vascular Surgery

## 2019-12-28 ENCOUNTER — Telehealth: Payer: Self-pay

## 2019-12-28 ENCOUNTER — Other Ambulatory Visit (HOSPITAL_COMMUNITY)
Admission: RE | Admit: 2019-12-28 | Discharge: 2019-12-28 | Disposition: A | Discharge status: Home / Self Care | Payer: Medicare HMO | Source: Ambulatory Visit | Attending: Vascular Surgery | Admitting: Vascular Surgery

## 2019-12-28 ENCOUNTER — Encounter: Payer: Self-pay | Admitting: Vascular Surgery

## 2019-12-28 VITALS — BP 141/76 | HR 91 | Temp 98.2°F | Resp 22 | Ht 69.0 in | Wt 200.0 lb

## 2019-12-28 DIAGNOSIS — Z20822 Contact with and (suspected) exposure to covid-19: Secondary | ICD-10-CM | POA: Insufficient documentation

## 2019-12-28 DIAGNOSIS — Z01812 Encounter for preprocedural laboratory examination: Secondary | ICD-10-CM | POA: Insufficient documentation

## 2019-12-28 DIAGNOSIS — I739 Peripheral vascular disease, unspecified: Secondary | ICD-10-CM | POA: Diagnosis not present

## 2019-12-28 LAB — SARS CORONAVIRUS 2 (TAT 6-24 HRS): SARS Coronavirus 2: NEGATIVE

## 2019-12-28 NOTE — Progress Notes (Signed)
Dr. Nyoka Cowden, Anesthesiology, requested that pt be assessed upon arrival on DOS.

## 2019-12-28 NOTE — Progress Notes (Signed)
Patient name: Henry Wiggins MRN: [id number] DOB: [birth year] Sex: male  HPI: Henry Wiggins Ask is a 86 y.o. male, who underwent attempted revascularization by Dr. Donzetta Matters about a week ago.  This was done for a nonhealing wound on the right foot.  The patient has become progressively worse over the last few days with unrelenting rest pain.  He did not sleep any last night.  He was given a prescription for some oxycodone but this made him very confused.  Previous arteriogram 1 week ago shows he does not have any further revascularization options in the right leg.  He also has a nonhealing ulcer in the left foot but this has not progressed.  He does not really complain of pain in the right leg.  The patient has mild dementia.  He was able to be conversant for the office visit today.  His daughter and son were present at the office visit today for ongoing discussions.  Other medical problems include diabetes, hyperlipidemia, hypertension all of which are currently stable.  He is on aspirin.  He is not on a statin.  Past Medical History:  Diagnosis Date  . Bladder cancer (HCC)    Dr. Janice Norrie  . Diabetes ([place])   . Hyperlipidemia   . Hypertension   . Stroke ([place])   . UTI (lower urinary tract infection)    Past Surgical History:  Procedure Laterality Date  . ABDOMINAL AORTOGRAM W/LOWER EXTREMITY Bilateral 12/19/2019   Procedure: ABDOMINAL AORTOGRAM W/LOWER EXTREMITY;  Surgeon: Waynetta Sandy, MD;  Location: [place];  Service: Cardiovascular;  Laterality: Bilateral;  . APPENDECTOMY    . CARPAL TUNNEL RELEASE Right   . CATARACT EXTRACTION Bilateral   . CHOLECYSTECTOMY    . EP IMPLANTABLE DEVICE N/A 05/28/2015   Procedure: Loop Recorder Insertion;  Surgeon: Sanda Klein, MD;  Location: [place] [facility];  Service: Cardiovascular;  Laterality: N/A;  . HERNIA REPAIR     x4  . PERIPHERAL VASCULAR BALLOON ANGIOPLASTY Right 12/19/2019   Procedure: PERIPHERAL VASCULAR BALLOON  ANGIOPLASTY;  Surgeon: Waynetta Sandy, MD;  Location: [place] [facility];  Service: Cardiovascular;  Laterality: Right;  Anterior tibial artery  . TEE WITHOUT CARDIOVERSION N/A 05/28/2015   Procedure: TRANSESOPHAGEAL ECHOCARDIOGRAM (TEE);  Surgeon: Sanda Klein, MD;  Location: [facility];  Service: Cardiovascular;  Laterality: N/A;  . TONSILLECTOMY      Family History  Problem Relation Age of Onset  . Hypertension Mother   . Hypertension Father     SOCIAL HISTORY: Social History   Socioeconomic History  . Marital status: Married    Spouse name: Not on file  . Number of children: 5  . Years of education: 10  . Highest education level: Not on file  Occupational History  . Occupation: Retired  Tobacco Use  . Smoking status: Former Smoker    Packs/day: 0.50    Years: 5.00    Pack years: 2.50    Types: Cigarettes    Quit date: 12/22/1953    Years since quitting: 66.0  . Smokeless tobacco: Never Used  Substance and Sexual Activity  . Alcohol use: No    Alcohol/week: 0.0 standard drinks  . Drug use: No  . Sexual activity: Not on file  Other Topics Concern  . Not on file  Social History Narrative   Lives at home with his wife.   Right-handed.   2 cups caffeine per day.   Social Determinants of Health   Financial Resource Strain:   .  Difficulty of Paying Living Expenses: Not on file  Food Insecurity:   . Worried About Charity fundraiser in the Last Year: Not on file  . Ran Out of Food in the Last Year: Not on file  Transportation Needs:   . Lack of Transportation (Medical): Not on file  . Lack of Transportation (Non-Medical): Not on file  Physical Activity:   . Days of Exercise per Week: Not on file  . Minutes of Exercise per Session: Not on file  Stress:   . Feeling of Stress : Not on file  Social Connections:   . Frequency of Communication with Friends and Family: Not on file  . Frequency of Social Gatherings with Friends and Family: Not on file  .  Attends Religious Services: Not on file  . Active Member of Clubs or Organizations: Not on file  . Attends Archivist Meetings: Not on file  . Marital Status: Not on file  Intimate Partner Violence:   . Fear of Current or Ex-Partner: Not on file  . Emotionally Abused: Not on file  . Physically Abused: Not on file  . Sexually Abused: Not on file    No Known Allergies  Current Outpatient Medications  Medication Sig Dispense Refill  . amLODipine (NORVASC) 10 MG tablet Take 10 mg by mouth daily.     Marland Kitchen aspirin EC 81 MG tablet Take 81 mg by mouth daily.    . Cholecalciferol (VITAMIN D) 50 MCG (2000 UT) tablet Take 2,000 Units by mouth daily.    Marland Kitchen ibuprofen (ADVIL) 200 MG tablet Take 400 mg by mouth every 8 (eight) hours as needed for headache or moderate pain.    . IODINE TINCTURE EX Apply 1 application topically daily as needed (wound care).    . lansoprazole (PREVACID) 15 MG capsule Take 15 mg by mouth daily as needed (acid reflux).    Marland Kitchen losartan-hydrochlorothiazide (HYZAAR) 100-25 MG per tablet Take 1 tablet by mouth daily. Hold for 1 week. (Patient taking differently: Take 1 tablet by mouth daily. )    . metFORMIN (GLUCOPHAGE) 1000 MG tablet Take 1 tablet by mouth 2 (two) times daily.    . Multiple Vitamins-Minerals (PRESERVISION AREDS 2) CAPS Take 1 tablet by mouth daily.    Marland Kitchen oxyCODONE-acetaminophen (PERCOCET) 5-325 MG tablet Take 1 tablet by mouth every 4 (four) hours as needed for severe pain. 30 tablet 0   No current facility-administered medications for this visit.    ROS:   General:  No weight loss, Fever, chills  HEENT: No recent headaches, no nasal bleeding, no visual changes, no sore throat  Neurologic: No dizziness, blackouts, seizures. No recent symptoms of stroke or mini- stroke. No recent episodes of slurred speech, or temporary blindness.  Cardiac: No recent episodes of chest pain/pressure, no shortness of breath at rest.  No shortness of breath with  exertion.  Denies history of atrial fibrillation or irregular heartbeat  Vascular: No history of rest pain in feet.  No history of claudication.  No history of non-healing ulcer, No history of DVT   Pulmonary: No home oxygen, no productive cough, no hemoptysis,  No asthma or wheezing  Musculoskeletal:  [X]  Arthritis, [ ]  Low back pain,  [ ]  Joint pain  Hematologic:No history of hypercoagulable state.  No history of easy bleeding.  No history of anemia  Gastrointestinal: No hematochezia or melena,  No gastroesophageal reflux, no trouble swallowing  Urinary: [X]  chronic Kidney disease, [ ]  on HD - [ ]   MWF or [ ]  TTHS, [ ]  Burning with urination, [ ]  Frequent urination, [ ]  Difficulty urinating;   Skin: No rashes  Psychological: No history of anxiety,  No history of depression   Physical Examination  Vitals:   12/28/19 1318  BP: (!) 141/76  Pulse: 91  Resp: (!) 22  Temp: 98.2 F (36.8 C)  TempSrc: Temporal  SpO2: 94%  Weight: 200 lb (90.7 kg)  Height: 5\' 9"  (1.753 m)    Body mass index is 29.53 kg/m.  General:  Alert and oriented, no acute distress HEENT: Normal Neck: No JVD Cardiac: Regular Rate and Rhythm Abdomen: Soft, non-tender, non-distended, obese Skin: No rash, diffuse duskiness right foot with some petechial rash, ulcer tip of left second toe with some granulation, right leg is warm to just above the ankle, some ecchymosis in the right groin from recent arteriogram, no mass Extremity Pulses:  2+ radial, brachial, femoral, absent popliteal dorsalis pedis, posterior tibial pulses bilaterally Musculoskeletal: No deformity trace pedal and ankle bilaterally edema  Neurologic: Upper and lower extremity motor 5/5 and symmetric   ASSESSMENT: Patient with nonhealing wound now with progressive ischemia of the right lower extremity and rest pain unresponsive to narcotic pain medication.  Patient has no revascularization options.   PLAN: Patient will be admitted to [facility] today for pain control.  Currently we are experiencing difficulty obtaining a bed for him due to the current Covid crisis.  If he is not admitted to the hospital this evening he will come in tomorrow morning as a same-day admission for his above-knee amputation.  Risk benefits possible complications and procedure details were discussed with the patient his daughter and son.  They understand that any operative procedure at this patient's age with general anesthesia will be high risk.  They also understand that the best way to palliate his current pain situation is with an amputation rather than narcotic pain medication.  I did discuss a palliative care approach and they would prefer an amputation instead of this currently.   Ruta Hinds, MD [facility] Office: [phone] Pager: [phone]

## 2019-12-28 NOTE — Progress Notes (Signed)
SDW-pre-op call completed by pt daughter, [name] (DPR). Daughter denies that pt C/O SOB and chest pain. Daughter denies that pt is under the care of a cardiologist. Daughter stated that pt PCP is Dr. Antony Contras. Daughter denies that pt had a stress test and cardiac cath. Daughter made aware to have pt stop taking vitamins, fish oil and herbal medications. Do not take any NSAIDs ie: Ibuprofen, Advil, Naproxen (Aleve), Motrin, BC and Goody Powder. Daughter made aware to have pt hold Metformin on DOS. Daughter stated that pt does not check CBG. Daughter verbalized understanding of all pre-op instructions. Nurse to review case with anesthesiologist on call.

## 2019-12-28 NOTE — Telephone Encounter (Signed)
Daughter called today requesting an appt for her dad to be seen.   She had originally called on Monday for a post op visit and Henry Wiggins had called to arrange that when daughter said that he was having complications. Henry Wiggins spoke with me and offered the patient an appointment with the PA on Monday but the daughter declined and Henry Wiggins explained to her how to send a [employer]. Which daughter did - however she did not get a response.   Daughter said that she called the on call dr last night who told her that she should come in to be seen today. Appt made for Henry Wiggins and I apologized to the daughter that they had not received a response to the [employer].   York Cerise, CMA

## 2019-12-29 ENCOUNTER — Encounter (HOSPITAL_COMMUNITY): Payer: Self-pay | Admitting: Vascular Surgery

## 2019-12-29 ENCOUNTER — Inpatient Hospital Stay (HOSPITAL_COMMUNITY): Payer: Medicare HMO

## 2019-12-29 ENCOUNTER — Encounter (HOSPITAL_COMMUNITY)
Admission: RE | Disposition: A | Discharge status: Home / Self Care | Payer: Self-pay | Source: Home / Self Care | Attending: Vascular Surgery

## 2019-12-29 ENCOUNTER — Inpatient Hospital Stay (HOSPITAL_COMMUNITY)
Admission: RE | Admit: 2019-12-29 | Discharge: 2020-01-04 | DRG: 239 | Disposition: A | Discharge status: Home Health | Payer: Medicare HMO | Attending: Vascular Surgery | Admitting: Vascular Surgery

## 2019-12-29 DIAGNOSIS — E785 Hyperlipidemia, unspecified: Secondary | ICD-10-CM | POA: Diagnosis present

## 2019-12-29 DIAGNOSIS — E11621 Type 2 diabetes mellitus with foot ulcer: Secondary | ICD-10-CM | POA: Diagnosis present

## 2019-12-29 DIAGNOSIS — I97191 Other postprocedural cardiac functional disturbances following other surgery: Secondary | ICD-10-CM | POA: Diagnosis not present

## 2019-12-29 DIAGNOSIS — H9190 Unspecified hearing loss, unspecified ear: Secondary | ICD-10-CM | POA: Diagnosis present

## 2019-12-29 DIAGNOSIS — F0390 Unspecified dementia without behavioral disturbance: Secondary | ICD-10-CM | POA: Diagnosis present

## 2019-12-29 DIAGNOSIS — Z8249 Family history of ischemic heart disease and other diseases of the circulatory system: Secondary | ICD-10-CM

## 2019-12-29 DIAGNOSIS — R069 Unspecified abnormalities of breathing: Secondary | ICD-10-CM | POA: Diagnosis not present

## 2019-12-29 DIAGNOSIS — I452 Bifascicular block: Secondary | ICD-10-CM | POA: Diagnosis not present

## 2019-12-29 DIAGNOSIS — I130 Hypertensive heart and chronic kidney disease with heart failure and stage 1 through stage 4 chronic kidney disease, or unspecified chronic kidney disease: Secondary | ICD-10-CM | POA: Diagnosis not present

## 2019-12-29 DIAGNOSIS — I4819 Other persistent atrial fibrillation: Secondary | ICD-10-CM | POA: Diagnosis not present

## 2019-12-29 DIAGNOSIS — F329 Major depressive disorder, single episode, unspecified: Secondary | ICD-10-CM | POA: Diagnosis present

## 2019-12-29 DIAGNOSIS — I96 Gangrene, not elsewhere classified: Secondary | ICD-10-CM | POA: Diagnosis not present

## 2019-12-29 DIAGNOSIS — I10 Essential (primary) hypertension: Secondary | ICD-10-CM | POA: Diagnosis not present

## 2019-12-29 DIAGNOSIS — K219 Gastro-esophageal reflux disease without esophagitis: Secondary | ICD-10-CM | POA: Diagnosis present

## 2019-12-29 DIAGNOSIS — I70261 Atherosclerosis of native arteries of extremities with gangrene, right leg: Secondary | ICD-10-CM | POA: Diagnosis not present

## 2019-12-29 DIAGNOSIS — N183 Chronic kidney disease, stage 3 unspecified: Secondary | ICD-10-CM | POA: Diagnosis present

## 2019-12-29 DIAGNOSIS — L02415 Cutaneous abscess of right lower limb: Secondary | ICD-10-CM | POA: Diagnosis not present

## 2019-12-29 DIAGNOSIS — R918 Other nonspecific abnormal finding of lung field: Secondary | ICD-10-CM | POA: Diagnosis not present

## 2019-12-29 DIAGNOSIS — I248 Other forms of acute ischemic heart disease: Secondary | ICD-10-CM | POA: Diagnosis not present

## 2019-12-29 DIAGNOSIS — Z8551 Personal history of malignant neoplasm of bladder: Secondary | ICD-10-CM | POA: Diagnosis not present

## 2019-12-29 DIAGNOSIS — E1165 Type 2 diabetes mellitus with hyperglycemia: Secondary | ICD-10-CM | POA: Diagnosis not present

## 2019-12-29 DIAGNOSIS — Z89611 Acquired absence of right leg above knee: Secondary | ICD-10-CM | POA: Diagnosis not present

## 2019-12-29 DIAGNOSIS — Z8673 Personal history of transient ischemic attack (TIA), and cerebral infarction without residual deficits: Secondary | ICD-10-CM

## 2019-12-29 DIAGNOSIS — E1152 Type 2 diabetes mellitus with diabetic peripheral angiopathy with gangrene: Principal | ICD-10-CM | POA: Diagnosis present

## 2019-12-29 DIAGNOSIS — I70201 Unspecified atherosclerosis of native arteries of extremities, right leg: Secondary | ICD-10-CM | POA: Diagnosis present

## 2019-12-29 DIAGNOSIS — I5023 Acute on chronic systolic (congestive) heart failure: Secondary | ICD-10-CM | POA: Diagnosis not present

## 2019-12-29 DIAGNOSIS — E1122 Type 2 diabetes mellitus with diabetic chronic kidney disease: Secondary | ICD-10-CM | POA: Diagnosis present

## 2019-12-29 DIAGNOSIS — I480 Paroxysmal atrial fibrillation: Secondary | ICD-10-CM | POA: Diagnosis not present

## 2019-12-29 DIAGNOSIS — I361 Nonrheumatic tricuspid (valve) insufficiency: Secondary | ICD-10-CM | POA: Diagnosis not present

## 2019-12-29 DIAGNOSIS — L97529 Non-pressure chronic ulcer of other part of left foot with unspecified severity: Secondary | ICD-10-CM | POA: Diagnosis present

## 2019-12-29 DIAGNOSIS — J9621 Acute and chronic respiratory failure with hypoxia: Secondary | ICD-10-CM | POA: Diagnosis not present

## 2019-12-29 DIAGNOSIS — J9601 Acute respiratory failure with hypoxia: Secondary | ICD-10-CM | POA: Diagnosis not present

## 2019-12-29 DIAGNOSIS — I340 Nonrheumatic mitral (valve) insufficiency: Secondary | ICD-10-CM | POA: Diagnosis not present

## 2019-12-29 DIAGNOSIS — I5031 Acute diastolic (congestive) heart failure: Secondary | ICD-10-CM | POA: Diagnosis not present

## 2019-12-29 DIAGNOSIS — I429 Cardiomyopathy, unspecified: Secondary | ICD-10-CM | POA: Diagnosis not present

## 2019-12-29 DIAGNOSIS — Z7984 Long term (current) use of oral hypoglycemic drugs: Secondary | ICD-10-CM | POA: Diagnosis not present

## 2019-12-29 DIAGNOSIS — Z89619 Acquired absence of unspecified leg above knee: Secondary | ICD-10-CM

## 2019-12-29 DIAGNOSIS — J810 Acute pulmonary edema: Secondary | ICD-10-CM | POA: Diagnosis not present

## 2019-12-29 DIAGNOSIS — N189 Chronic kidney disease, unspecified: Secondary | ICD-10-CM | POA: Diagnosis not present

## 2019-12-29 DIAGNOSIS — J90 Pleural effusion, not elsewhere classified: Secondary | ICD-10-CM | POA: Diagnosis not present

## 2019-12-29 DIAGNOSIS — Z20822 Contact with and (suspected) exposure to covid-19: Secondary | ICD-10-CM | POA: Diagnosis not present

## 2019-12-29 DIAGNOSIS — I9789 Other postprocedural complications and disorders of the circulatory system, not elsewhere classified: Secondary | ICD-10-CM | POA: Diagnosis not present

## 2019-12-29 DIAGNOSIS — Y839 Surgical procedure, unspecified as the cause of abnormal reaction of the patient, or of later complication, without mention of misadventure at the time of the procedure: Secondary | ICD-10-CM | POA: Diagnosis not present

## 2019-12-29 DIAGNOSIS — Z87891 Personal history of nicotine dependence: Secondary | ICD-10-CM

## 2019-12-29 DIAGNOSIS — I739 Peripheral vascular disease, unspecified: Secondary | ICD-10-CM | POA: Diagnosis present

## 2019-12-29 DIAGNOSIS — N179 Acute kidney failure, unspecified: Secondary | ICD-10-CM | POA: Diagnosis not present

## 2019-12-29 DIAGNOSIS — I4891 Unspecified atrial fibrillation: Secondary | ICD-10-CM | POA: Diagnosis not present

## 2019-12-29 DIAGNOSIS — R0902 Hypoxemia: Secondary | ICD-10-CM

## 2019-12-29 DIAGNOSIS — L97119 Non-pressure chronic ulcer of right thigh with unspecified severity: Secondary | ICD-10-CM | POA: Diagnosis not present

## 2019-12-29 HISTORY — DX: Unspecified osteoarthritis, unspecified site: M19.90

## 2019-12-29 HISTORY — DX: Complete loss of teeth, unspecified cause, unspecified class: Z97.2

## 2019-12-29 HISTORY — DX: Unspecified hearing loss, unspecified ear: H91.90

## 2019-12-29 HISTORY — DX: Gastro-esophageal reflux disease without esophagitis: K21.9

## 2019-12-29 HISTORY — DX: Other amnesia: R41.3

## 2019-12-29 HISTORY — DX: Family history of other specified conditions: Z84.89

## 2019-12-29 HISTORY — DX: Complete loss of teeth, unspecified cause, unspecified class: K08.109

## 2019-12-29 HISTORY — DX: Depression, unspecified: F32.A

## 2019-12-29 HISTORY — PX: AMPUTATION: SHX166

## 2019-12-29 LAB — COMPREHENSIVE METABOLIC PANEL
ALT: 65 U/L — ABNORMAL HIGH (ref 0–44)
AST: 74 U/L — ABNORMAL HIGH (ref 15–41)
Albumin: 2.7 g/dL — ABNORMAL LOW (ref 3.5–5.0)
Alkaline Phosphatase: 81 U/L (ref 38–126)
Anion gap: 12 (ref 5–15)
BUN: 48 mg/dL — ABNORMAL HIGH (ref 8–23)
CO2: 22 mmol/L (ref 22–32)
Calcium: 9 mg/dL (ref 8.9–10.3)
Chloride: 105 mmol/L (ref 98–111)
Creatinine, Ser: 2.02 mg/dL — ABNORMAL HIGH (ref 0.61–1.24)
GFR calc Af Amer: 33 mL/min — ABNORMAL LOW (ref >60)
GFR calc non Af Amer: 28 mL/min — ABNORMAL LOW (ref >60)
Glucose, Bld: 208 mg/dL — ABNORMAL HIGH (ref 70–99)
Potassium: 5.7 mmol/L — ABNORMAL HIGH (ref 3.5–5.1)
Sodium: 139 mmol/L (ref 135–145)
Total Bilirubin: 2.3 mg/dL — ABNORMAL HIGH (ref 0.3–1.2)
Total Protein: 6.6 g/dL (ref 6.5–8.1)

## 2019-12-29 LAB — PROTIME-INR
INR: 1.2 (ref 0.8–1.2)
Prothrombin Time: 14.9 seconds (ref 11.4–15.2)

## 2019-12-29 LAB — CBC
HCT: 34.5 % — ABNORMAL LOW (ref 39.0–52.0)
Hemoglobin: 11 g/dL — ABNORMAL LOW (ref 13.0–17.0)
MCH: 29.9 pg (ref 26.0–34.0)
MCHC: 31.9 g/dL (ref 30.0–36.0)
MCV: 93.8 fL (ref 80.0–100.0)
Platelets: 257 10*3/uL (ref 150–400)
RBC: 3.68 MIL/uL — ABNORMAL LOW (ref 4.22–5.81)
RDW: 13.8 % (ref 11.5–15.5)
WBC: 15.2 10*3/uL — ABNORMAL HIGH (ref 4.0–10.5)
nRBC: 0 % (ref 0.0–0.2)

## 2019-12-29 LAB — HEMOGLOBIN A1C
Hgb A1c MFr Bld: 8.3 % — ABNORMAL HIGH (ref 4.8–5.6)
Mean Plasma Glucose: 191.51 mg/dL

## 2019-12-29 LAB — GLUCOSE, CAPILLARY
Glucose-Capillary: 185 mg/dL — ABNORMAL HIGH (ref 70–99)
Glucose-Capillary: 201 mg/dL — ABNORMAL HIGH (ref 70–99)
Glucose-Capillary: 169 mg/dL — ABNORMAL HIGH (ref 70–99)
Glucose-Capillary: 135 mg/dL — ABNORMAL HIGH (ref 70–99)

## 2019-12-29 LAB — APTT: aPTT: 29 seconds (ref 24–36)

## 2019-12-29 LAB — TYPE AND SCREEN
ABO/RH(D): O NEG
Antibody Screen: NEGATIVE

## 2019-12-29 LAB — ABO/RH: ABO/RH(D): O NEG

## 2019-12-29 SURGERY — AMPUTATION, ABOVE KNEE
Anesthesia: General | Site: Knee | Laterality: Right

## 2019-12-29 MED ORDER — MORPHINE SULFATE (PF) 2 MG/ML IV SOLN
2.0000 mg | INTRAVENOUS | Status: DC | PRN
  Administered 2019-12-31: 2 mg via INTRAVENOUS
  Filled 2019-12-29: qty 1

## 2019-12-29 MED ORDER — CEFAZOLIN SODIUM-DEXTROSE 2-4 GM/100ML-% IV SOLN
2.0000 g | INTRAVENOUS | Status: AC
  Administered 2019-12-29: 2 g via INTRAVENOUS

## 2019-12-29 MED ORDER — 0.9 % SODIUM CHLORIDE (POUR BTL) OPTIME
TOPICAL | Status: DC | PRN
  Administered 2019-12-29: 1000 mL

## 2019-12-29 MED ORDER — MAGNESIUM SULFATE 2 GM/50ML IV SOLN: 2.0000 g | Freq: Every day | INTRAVENOUS | Status: DC | PRN

## 2019-12-29 MED ORDER — DOCUSATE SODIUM 100 MG PO CAPS
100.0000 mg | ORAL_CAPSULE | Freq: Every day | ORAL | Status: DC
  Administered 2019-12-30 – 2020-01-04 (×6): 100 mg via ORAL
  Filled 2019-12-29 (×7): qty 1

## 2019-12-29 MED ORDER — ALUM & MAG HYDROXIDE-SIMETH 200-200-20 MG/5ML PO SUSP: 15.0000 mL | ORAL | Status: DC | PRN

## 2019-12-29 MED ORDER — LIDOCAINE HCL (CARDIAC) PF 100 MG/5ML IV SOSY
PREFILLED_SYRINGE | INTRAVENOUS | Status: DC | PRN
  Administered 2019-12-29: 60 mg via INTRAVENOUS

## 2019-12-29 MED ORDER — INSULIN ASPART 100 UNIT/ML ~~LOC~~ SOLN
0.0000 [IU] | Freq: Three times a day (TID) | SUBCUTANEOUS | Status: DC
  Administered 2019-12-29: 17:00:00 3 [IU] via SUBCUTANEOUS
  Administered 2019-12-30: 16:00:00 2 [IU] via SUBCUTANEOUS
  Administered 2019-12-30: 8 [IU] via SUBCUTANEOUS
  Administered 2019-12-30: 06:00:00 5 [IU] via SUBCUTANEOUS

## 2019-12-29 MED ORDER — HYDRALAZINE HCL 20 MG/ML IJ SOLN: 5.0000 mg | INTRAMUSCULAR | Status: DC | PRN

## 2019-12-29 MED ORDER — ONDANSETRON HCL 4 MG/2ML IJ SOLN: 4.0000 mg | Freq: Four times a day (QID) | INTRAMUSCULAR | Status: DC | PRN

## 2019-12-29 MED ORDER — FENTANYL CITRATE (PF) 100 MCG/2ML IJ SOLN
INTRAMUSCULAR | Status: DC | PRN
  Administered 2019-12-29: 100 ug via INTRAVENOUS
  Administered 2019-12-29: 50 ug via INTRAVENOUS

## 2019-12-29 MED ORDER — LOSARTAN POTASSIUM-HCTZ 100-25 MG PO TABS: 1.0000 | ORAL_TABLET | Freq: Every day | ORAL | Status: DC

## 2019-12-29 MED ORDER — POTASSIUM CHLORIDE CRYS ER 20 MEQ PO TBCR: 20.0000 meq | EXTENDED_RELEASE_TABLET | Freq: Every day | ORAL | Status: DC | PRN

## 2019-12-29 MED ORDER — AMLODIPINE BESYLATE 10 MG PO TABS
10.0000 mg | ORAL_TABLET | Freq: Every day | ORAL | Status: DC
  Administered 2019-12-30: 10 mg via ORAL
  Filled 2019-12-29: qty 1

## 2019-12-29 MED ORDER — FENTANYL CITRATE (PF) 100 MCG/2ML IJ SOLN
INTRAMUSCULAR | Status: AC
  Filled 2019-12-29: qty 2

## 2019-12-29 MED ORDER — SUGAMMADEX SODIUM 200 MG/2ML IV SOLN
INTRAVENOUS | Status: DC | PRN
  Administered 2019-12-29: 200 mg via INTRAVENOUS

## 2019-12-29 MED ORDER — PHENYLEPHRINE HCL-NACL 10-0.9 MG/250ML-% IV SOLN
INTRAVENOUS | Status: DC | PRN
  Administered 2019-12-29: 25 ug/min via INTRAVENOUS

## 2019-12-29 MED ORDER — ONDANSETRON HCL 4 MG/2ML IJ SOLN
INTRAMUSCULAR | Status: DC | PRN
  Administered 2019-12-29: 4 mg via INTRAVENOUS

## 2019-12-29 MED ORDER — METOPROLOL TARTRATE 5 MG/5ML IV SOLN: 2.0000 mg | INTRAVENOUS | Status: DC | PRN

## 2019-12-29 MED ORDER — CEFAZOLIN SODIUM-DEXTROSE 2-4 GM/100ML-% IV SOLN
INTRAVENOUS | Status: AC
  Filled 2019-12-29: qty 100

## 2019-12-29 MED ORDER — PANTOPRAZOLE SODIUM 40 MG PO TBEC
40.0000 mg | DELAYED_RELEASE_TABLET | Freq: Every day | ORAL | Status: DC
  Administered 2019-12-30 – 2020-01-04 (×6): 40 mg via ORAL
  Filled 2019-12-29 (×8): qty 1

## 2019-12-29 MED ORDER — ONDANSETRON HCL 4 MG/2ML IJ SOLN: 4.0000 mg | Freq: Once | INTRAMUSCULAR | Status: DC | PRN

## 2019-12-29 MED ORDER — CHLORHEXIDINE GLUCONATE CLOTH 2 % EX PADS: 6.0000 | MEDICATED_PAD | Freq: Once | CUTANEOUS | Status: DC

## 2019-12-29 MED ORDER — ACETAMINOPHEN 325 MG RE SUPP: 325.0000 mg | RECTAL | Status: DC | PRN

## 2019-12-29 MED ORDER — SODIUM CHLORIDE 0.9 % IV SOLN: INTRAVENOUS | Status: DC

## 2019-12-29 MED ORDER — ACETAMINOPHEN 325 MG PO TABS
325.0000 mg | ORAL_TABLET | ORAL | Status: DC | PRN
  Administered 2020-01-02: 12:00:00 650 mg via ORAL
  Filled 2019-12-29: qty 2

## 2019-12-29 MED ORDER — FENTANYL CITRATE (PF) 100 MCG/2ML IJ SOLN
25.0000 ug | INTRAMUSCULAR | Status: DC | PRN
  Administered 2019-12-29: 14:00:00 25 ug via INTRAVENOUS

## 2019-12-29 MED ORDER — OXYCODONE-ACETAMINOPHEN 5-325 MG PO TABS
1.0000 | ORAL_TABLET | ORAL | Status: DC | PRN
  Administered 2019-12-30: 20:00:00 1 via ORAL
  Filled 2019-12-29: qty 1

## 2019-12-29 MED ORDER — HEPARIN SODIUM (PORCINE) 5000 UNIT/ML IJ SOLN
5000.0000 [IU] | Freq: Three times a day (TID) | INTRAMUSCULAR | Status: DC
  Administered 2019-12-30 – 2020-01-01 (×7): 5000 [IU] via SUBCUTANEOUS
  Filled 2019-12-29 (×6): qty 1

## 2019-12-29 MED ORDER — POLYETHYLENE GLYCOL 3350 17 G PO PACK: 17.0000 g | PACK | Freq: Every day | ORAL | Status: DC | PRN

## 2019-12-29 MED ORDER — LABETALOL HCL 5 MG/ML IV SOLN: 10.0000 mg | INTRAVENOUS | Status: DC | PRN

## 2019-12-29 MED ORDER — LACTATED RINGERS IV SOLN: INTRAVENOUS | Status: DC

## 2019-12-29 MED ORDER — PROPOFOL 10 MG/ML IV BOLUS
INTRAVENOUS | Status: DC | PRN
  Administered 2019-12-29: 100 mg via INTRAVENOUS

## 2019-12-29 MED ORDER — LOSARTAN POTASSIUM 50 MG PO TABS
100.0000 mg | ORAL_TABLET | Freq: Every day | ORAL | Status: DC
  Administered 2019-12-30: 100 mg via ORAL
  Filled 2019-12-29: qty 2

## 2019-12-29 MED ORDER — ROCURONIUM BROMIDE 100 MG/10ML IV SOLN
INTRAVENOUS | Status: DC | PRN
  Administered 2019-12-29: 30 mg via INTRAVENOUS

## 2019-12-29 MED ORDER — PHENOL 1.4 % MT LIQD: 1.0000 | OROMUCOSAL | Status: DC | PRN

## 2019-12-29 MED ORDER — ACETAMINOPHEN 500 MG PO TABS
1000.0000 mg | ORAL_TABLET | Freq: Once | ORAL | Status: AC
  Administered 2019-12-29: 11:00:00 1000 mg via ORAL
  Filled 2019-12-29: qty 2

## 2019-12-29 MED ORDER — PHENYLEPHRINE 40 MCG/ML (10ML) SYRINGE FOR IV PUSH (FOR BLOOD PRESSURE SUPPORT)
PREFILLED_SYRINGE | INTRAVENOUS | Status: DC | PRN
  Administered 2019-12-29: 80 ug via INTRAVENOUS

## 2019-12-29 MED ORDER — GUAIFENESIN-DM 100-10 MG/5ML PO SYRP: 15.0000 mL | ORAL_SOLUTION | ORAL | Status: DC | PRN

## 2019-12-29 MED ORDER — METOPROLOL TARTRATE 5 MG/5ML IV SOLN
2.0000 mg | INTRAVENOUS | Status: AC | PRN
  Administered 2019-12-31 (×2): 5 mg via INTRAVENOUS
  Filled 2019-12-29 (×2): qty 5

## 2019-12-29 MED ORDER — HYDROCHLOROTHIAZIDE 25 MG PO TABS
25.0000 mg | ORAL_TABLET | Freq: Every day | ORAL | Status: DC
  Administered 2019-12-30: 10:00:00 25 mg via ORAL
  Filled 2019-12-29: qty 1

## 2019-12-29 MED ORDER — FENTANYL CITRATE (PF) 250 MCG/5ML IJ SOLN
INTRAMUSCULAR | Status: AC
  Filled 2019-12-29: qty 5

## 2019-12-29 MED ORDER — ASPIRIN EC 81 MG PO TBEC
81.0000 mg | DELAYED_RELEASE_TABLET | Freq: Every day | ORAL | Status: DC
  Administered 2019-12-30 – 2019-12-31 (×2): 81 mg via ORAL
  Filled 2019-12-29 (×3): qty 1

## 2019-12-29 MED ORDER — CEFAZOLIN SODIUM-DEXTROSE 2-4 GM/100ML-% IV SOLN
2.0000 g | Freq: Three times a day (TID) | INTRAVENOUS | Status: AC
  Administered 2019-12-29 – 2019-12-30 (×2): 2 g via INTRAVENOUS
  Filled 2019-12-29 (×2): qty 100

## 2019-12-29 SURGICAL SUPPLY — 44 items
BANDAGE ESMARK 6X9 LF (GAUZE/BANDAGES/DRESSINGS) ×1 IMPLANT
BLADE SAGITTAL (BLADE)
BLADE SAGITTAL 25.0X1.19X90 (BLADE) IMPLANT
BLADE SAW GIGLI 510 (BLADE) ×2 IMPLANT
BLADE SAW THK.89X75X18XSGTL (BLADE) IMPLANT
BNDG COHESIVE 6X5 TAN STRL LF (GAUZE/BANDAGES/DRESSINGS) ×2 IMPLANT
BNDG ELASTIC 4X5.8 VLCR STR LF (GAUZE/BANDAGES/DRESSINGS) ×2 IMPLANT
BNDG ESMARK 6X9 LF (GAUZE/BANDAGES/DRESSINGS) ×2
BNDG GAUZE ELAST 4 BULKY (GAUZE/BANDAGES/DRESSINGS) ×2 IMPLANT
CANISTER SUCT 3000ML PPV (MISCELLANEOUS) ×2 IMPLANT
COVER SURGICAL LIGHT HANDLE (MISCELLANEOUS) ×2 IMPLANT
CUFF TOURN SGL QUICK 24 (TOURNIQUET CUFF)
CUFF TOURN SGL QUICK 34 (TOURNIQUET CUFF)
CUFF TRNQT CYL 24X4X16.5-23 (TOURNIQUET CUFF) IMPLANT
CUFF TRNQT CYL 34X4.125X (TOURNIQUET CUFF) IMPLANT
DRAPE HALF SHEET 40X57 (DRAPES) ×2 IMPLANT
DRAPE ORTHO SPLIT 77X108 STRL (DRAPES) ×4
DRAPE SURG ORHT 6 SPLT 77X108 (DRAPES) ×2 IMPLANT
DRSG ADAPTIC 3X8 NADH LF (GAUZE/BANDAGES/DRESSINGS) ×2 IMPLANT
ELECT REM PT RETURN 9FT ADLT (ELECTROSURGICAL) ×2
ELECTRODE REM PT RTRN 9FT ADLT (ELECTROSURGICAL) ×1 IMPLANT
GAUZE SPONGE 4X4 12PLY STRL (GAUZE/BANDAGES/DRESSINGS) ×2 IMPLANT
GLOVE BIO SURGEON STRL SZ7.5 (GLOVE) ×2 IMPLANT
GOWN STRL REUS W/ TWL LRG LVL3 (GOWN DISPOSABLE) ×2 IMPLANT
GOWN STRL REUS W/ TWL XL LVL3 (GOWN DISPOSABLE) ×1 IMPLANT
GOWN STRL REUS W/TWL LRG LVL3 (GOWN DISPOSABLE) ×4
GOWN STRL REUS W/TWL XL LVL3 (GOWN DISPOSABLE) ×2
KIT BASIN OR (CUSTOM PROCEDURE TRAY) ×2 IMPLANT
KIT TURNOVER KIT B (KITS) ×2 IMPLANT
NS IRRIG 1000ML POUR BTL (IV SOLUTION) ×2 IMPLANT
PACK GENERAL/GYN (CUSTOM PROCEDURE TRAY) ×2 IMPLANT
PAD ARMBOARD 7.5X6 YLW CONV (MISCELLANEOUS) ×4 IMPLANT
STAPLER VISISTAT 35W (STAPLE) ×2 IMPLANT
STOCKINETTE IMPERVIOUS LG (DRAPES) ×2 IMPLANT
SUT ETHILON 3 0 PS 1 (SUTURE) IMPLANT
SUT SILK 0 TIES 10X30 (SUTURE) ×2 IMPLANT
SUT SILK 2 0 (SUTURE) ×1
SUT SILK 2 0 SH CR/8 (SUTURE) ×2 IMPLANT
SUT SILK 2-0 18XBRD TIE 12 (SUTURE) ×1 IMPLANT
SUT SILK 3 0 (SUTURE) ×1
SUT SILK 3-0 18XBRD TIE 12 (SUTURE) ×1 IMPLANT
SUT VIC AB 2-0 CT1 18 (SUTURE) ×4 IMPLANT
TOWEL GREEN STERILE (TOWEL DISPOSABLE) ×4 IMPLANT
UNDERPAD 30X30 (UNDERPADS AND DIAPERS) ×2 IMPLANT

## 2019-12-29 NOTE — Anesthesia Postprocedure Evaluation (Signed)
Anesthesia Post Note  Patient: Henry Wiggins  Procedure(s) Performed: AMPUTATION ABOVE KNEE (Right Knee)     Patient location during evaluation: PACU Anesthesia Type: General Level of consciousness: awake Pain management: pain level controlled Vital Signs Assessment: post-procedure vital signs reviewed and stable Respiratory status: spontaneous breathing, nonlabored ventilation, respiratory function stable and patient connected to nasal cannula oxygen Cardiovascular status: blood pressure returned to baseline and stable Postop Assessment: no apparent nausea or vomiting Anesthetic complications: no    Last Vitals:  Vitals:   12/29/19 1517 12/29/19 1554  BP: (!) 109/53 (!) 123/57  Pulse: 66 69  Resp: 16   Temp:  36.8 C  SpO2: 97% 90%    Last Pain:  Vitals:   12/29/19 1627  TempSrc:   PainSc: 0-No pain                 Aila Terra P Hussam Muniz

## 2019-12-29 NOTE — Op Note (Signed)
    Patient name: Henry Wiggins MRN: [id number] DOB: [birth year] Sex: male  12/29/2019 Pre-operative Diagnosis: gangrene right foot Post-operative diagnosis:  Same Surgeon:  Erlene Quan C. Donzetta Matters, MD Assistant: Arlee Muslim, PA Procedure Performed:   Right above-knee amputation  Indications: 86 year old male with history of bilateral lower extremity ischemic ulceration.  He recently underwent attempted revascularization on the right lower extremity now has progressive ischemic changes with extreme pain in his indicated for above-knee amputation.  Findings: All tissue appeared healthy.  SFA was heavily calcified.  Anterior and posterior flaps were approximated well.   Procedure:  The patient was identified in the holding area and taken to the operating room where is placed upon operative table and general anesthesia was induced.  He was gently prepped draped in the right lower extremity usual fashion antibiotics were administered and a timeout was called.  Tourniquet was placed at the level of the thigh Esmarch was used to exsanguinate the right lower extremity.  [place] type incision was made with 10 blade.  Cautery was used to carry the incision down to the bone.  Periosteum was elevated.  Femur was transected with Gigli saw.  Vessels were clamped and divided.  Posterior flap was created with amputation knife.  The tourniquet was allowed down.  Nerve was pulled on tension and tied off with Vicryl suture and divided.  Blood vessels were ligated with silk figure-of-eight suture.  Hemostasis was obtained and the wound was thoroughly irrigated.  Flaps were reapproximated with interrupted 2-0 Vicryl suture.  Skin was closed with staples.  He was awakened anesthesia having tolerated procedure well without immediate complication.  Counts were correct at completion.  EBL: 50 cc    Henry Wiggins C. Donzetta Matters, MD [facility] Office: [phone] Pager: [phone]

## 2019-12-29 NOTE — Anesthesia Procedure Notes (Signed)
Procedure Name: Intubation Date/Time: 12/29/2019 12:30 PM Performed by: Eligha Bridegroom, CRNA Pre-anesthesia Checklist: Patient identified, Emergency Drugs available, Patient being monitored and Timeout performed Patient Re-evaluated:Patient Re-evaluated prior to induction Preoxygenation: Pre-oxygenation with 100% oxygen Induction Type: IV induction Ventilation: Mask ventilation without difficulty Laryngoscope Size: 4 and Mac Grade View: Grade I Tube size: 7.5 mm Number of attempts: 1 Airway Equipment and Method: Stylet Placement Confirmation: ETT inserted through vocal cords under direct vision,  positive ETCO2 and breath sounds checked- equal and bilateral Secured at: 22 cm Tube secured with: Tape Dental Injury: Teeth and Oropharynx as per pre-operative assessment

## 2019-12-29 NOTE — H&P (Signed)
   History and Physical Update  The patient was interviewed and re-examined.  The patient's previous History and Physical has been reviewed and is unchanged from office visit yesterday.  We will plan for right above-knee amputation today.  Lynnita Somma C. Donzetta Matters, MD [facility] [place] Office: [phone] Pager: [phone]   12/29/2019, 10:53 AM

## 2019-12-29 NOTE — Anesthesia Preprocedure Evaluation (Addendum)
Anesthesia Evaluation  Patient identified by MRN, date of birth, ID bandGeneral Assessment Comment:Patient awake  Reviewed: Allergy & Precautions, NPO status , Patient's Chart, lab work & pertinent test results  Airway Mallampati: III  TM Distance: >3 FB Neck ROM: Full    Dental  (+) Edentulous Upper, Edentulous Lower   Pulmonary former smoker,    Pulmonary exam normal breath sounds clear to auscultation       Cardiovascular hypertension, Pt. on medications + Peripheral Vascular Disease  Normal cardiovascular exam Rhythm:Regular Rate:Normal  ECG: rate 71. Normal sinus rhythm Right bundle branch block Left anterior fascicular block Bifascicular block   Neuro/Psych PSYCHIATRIC DISORDERS Depression Memory loss CVA, No Residual Symptoms    GI/Hepatic Neg liver ROS, GERD  Medicated and Controlled,  Endo/Other  diabetes, Oral Hypoglycemic Agents  Renal/GU Renal InsufficiencyRenal disease     Musculoskeletal negative musculoskeletal ROS (+)   Abdominal   Peds  Hematology  (+) anemia , HLD   Anesthesia Other Findings PAD  Reproductive/Obstetrics                            Anesthesia Physical Anesthesia Plan  ASA: III  Anesthesia Plan: General   Post-op Pain Management:    Induction: Intravenous  PONV Risk Score and Plan: 3 and Ondansetron, Dexamethasone and Treatment may vary due to age or medical condition  Airway Management Planned: Oral ETT  Additional Equipment:   Intra-op Plan:   Post-operative Plan: Extubation in OR  Informed Consent: I have reviewed the patients History and Physical, chart, labs and discussed the procedure including the risks, benefits and alternatives for the proposed anesthesia with the patient or authorized representative who has indicated his/her understanding and acceptance.       Plan Discussed with: CRNA  Anesthesia Plan Comments:          Anesthesia Quick Evaluation

## 2019-12-29 NOTE — Progress Notes (Signed)
Patient unable to provide urine specimen

## 2019-12-29 NOTE — Transfer of Care (Signed)
Immediate Anesthesia Transfer of Care Note  Patient: Henry Wiggins  Procedure(s) Performed: AMPUTATION ABOVE KNEE (Right Knee)  Patient Location: PACU  Anesthesia Type:General  Level of Consciousness: awake and alert   Airway & Oxygen Therapy: Patient Spontanous Breathing and Patient connected to face mask oxygen  Post-op Assessment: Report given to RN and Post -op Vital signs reviewed and stable  Post vital signs: Reviewed and stable  Last Vitals:  Vitals Value Taken Time  BP 122/91 12/29/19 1332  Temp 36.6 C 12/29/19 1332  Pulse 72 12/29/19 1345  Resp 24 12/29/19 1345  SpO2 96 % 12/29/19 1345  Vitals shown include unvalidated device data.  Last Pain:  Vitals:   12/29/19 1332  TempSrc:   PainSc: Asleep         Complications: No apparent anesthesia complications

## 2019-12-29 NOTE — Progress Notes (Signed)
Pt received from PACU. R AKA w/ dressing clean dry and intact. VSS. CHG complete. Telemetry applied. Pt and daughter oriented to room and unit. Will continue to monitor.  Clyde Canterbury, RN

## 2019-12-30 ENCOUNTER — Inpatient Hospital Stay (HOSPITAL_COMMUNITY): Payer: Medicare HMO

## 2019-12-30 LAB — CBC
HCT: 33.4 % — ABNORMAL LOW (ref 39.0–52.0)
Hemoglobin: 10.6 g/dL — ABNORMAL LOW (ref 13.0–17.0)
MCH: 29.9 pg (ref 26.0–34.0)
MCHC: 31.7 g/dL (ref 30.0–36.0)
MCV: 94.1 fL (ref 80.0–100.0)
Platelets: 343 10*3/uL (ref 150–400)
RBC: 3.55 MIL/uL — ABNORMAL LOW (ref 4.22–5.81)
RDW: 14 % (ref 11.5–15.5)
WBC: 12.5 10*3/uL — ABNORMAL HIGH (ref 4.0–10.5)
nRBC: 0 % (ref 0.0–0.2)

## 2019-12-30 LAB — BASIC METABOLIC PANEL
Anion gap: 14 (ref 5–15)
BUN: 45 mg/dL — ABNORMAL HIGH (ref 8–23)
CO2: 22 mmol/L (ref 22–32)
Calcium: 8.5 mg/dL — ABNORMAL LOW (ref 8.9–10.3)
Chloride: 102 mmol/L (ref 98–111)
Creatinine, Ser: 1.76 mg/dL — ABNORMAL HIGH (ref 0.61–1.24)
GFR calc Af Amer: 39 mL/min — ABNORMAL LOW (ref >60)
GFR calc non Af Amer: 33 mL/min — ABNORMAL LOW (ref >60)
Glucose, Bld: 211 mg/dL — ABNORMAL HIGH (ref 70–99)
Potassium: 5.2 mmol/L — ABNORMAL HIGH (ref 3.5–5.1)
Sodium: 138 mmol/L (ref 135–145)

## 2019-12-30 LAB — BLOOD GAS, ARTERIAL
Acid-base deficit: 2.4 mmol/L — ABNORMAL HIGH (ref 0.0–2.0)
Bicarbonate: 21.6 mmol/L (ref 20.0–28.0)
Drawn by: 137461
FIO2: 21
O2 Saturation: 91.9 %
Patient temperature: 37
pCO2 arterial: 35.4 mmHg (ref 32.0–48.0)
pH, Arterial: 7.403 (ref 7.350–7.450)
pO2, Arterial: 61 mmHg — ABNORMAL LOW (ref 83.0–108.0)

## 2019-12-30 LAB — GLUCOSE, CAPILLARY
Glucose-Capillary: 215 mg/dL — ABNORMAL HIGH (ref 70–99)
Glucose-Capillary: 290 mg/dL — ABNORMAL HIGH (ref 70–99)
Glucose-Capillary: 150 mg/dL — ABNORMAL HIGH (ref 70–99)
Glucose-Capillary: 354 mg/dL — ABNORMAL HIGH (ref 70–99)
Glucose-Capillary: 311 mg/dL — ABNORMAL HIGH (ref 70–99)

## 2019-12-30 MED ORDER — FUROSEMIDE 10 MG/ML IJ SOLN
40.0000 mg | Freq: Once | INTRAMUSCULAR | Status: AC
  Administered 2019-12-30: 11:00:00 40 mg via INTRAVENOUS
  Filled 2019-12-30: qty 4

## 2019-12-30 MED ORDER — FUROSEMIDE 20 MG PO TABS
20.0000 mg | ORAL_TABLET | Freq: Every day | ORAL | Status: DC
  Administered 2019-12-30: 20 mg via ORAL
  Filled 2019-12-30: qty 1

## 2019-12-30 MED ORDER — INSULIN ASPART 100 UNIT/ML ~~LOC~~ SOLN
0.0000 [IU] | Freq: Four times a day (QID) | SUBCUTANEOUS | Status: DC
  Administered 2019-12-30: 11 [IU] via SUBCUTANEOUS
  Administered 2019-12-31: 15:00:00 5 [IU] via SUBCUTANEOUS
  Administered 2019-12-31: 8 [IU] via SUBCUTANEOUS
  Administered 2019-12-31: 22:00:00 5 [IU] via SUBCUTANEOUS
  Administered 2020-01-01: 13:00:00 8 [IU] via SUBCUTANEOUS
  Administered 2020-01-01 (×2): 5 [IU] via SUBCUTANEOUS
  Administered 2020-01-01: 8 [IU] via SUBCUTANEOUS
  Administered 2020-01-01: 17:00:00 3 [IU] via SUBCUTANEOUS
  Administered 2020-01-02 (×2): 5 [IU] via SUBCUTANEOUS
  Administered 2020-01-02 (×2): 8 [IU] via SUBCUTANEOUS
  Administered 2020-01-03: 22:00:00 3 [IU] via SUBCUTANEOUS
  Administered 2020-01-03: 12:00:00 11 [IU] via SUBCUTANEOUS
  Administered 2020-01-03: 07:00:00 3 [IU] via SUBCUTANEOUS
  Administered 2020-01-03 – 2020-01-04 (×3): 5 [IU] via SUBCUTANEOUS

## 2019-12-30 NOTE — Progress Notes (Signed)
Rehab Admissions Coordinator Note:  Per PT recommendation, patient was screened by Michel Santee for appropriateness for an Inpatient Acute Rehab Consult.  At this time, we are recommending Inpatient Rehab consult.  I will place an order so we can evaluate the patient.   Michel Santee 12/30/2019, 11:21 AM  I can be reached at [phone].

## 2019-12-30 NOTE — Evaluation (Signed)
Occupational Therapy Evaluation Patient Details Name: Henry Wiggins MRN: [id number] DOB: 06-18-1927 Today's Date: 12/30/2019    History of Present Illness Patient is a 86 y/o male who presents s/p right AKA 12/29/19. PMH includes CVA, HTN, HLD, memory loss, DM, depression, bladder ca.   Clinical Impression   Patient is a 86 year old male that lives with his daughter in a multi level home with "a few" steps to enter. Patient bed/bath is upstairs. Patient reports daughter works during the day, assists patient with bathing, dressing and IADLs, patient able to toilet himself. Currently patient requires max A x2 for lateral scoots from edge of bed to bedside chair with increase time for rest due to shortness of breath. Patient also with decreased cognition disoriented x3 and requiring increased cues for sequencing, problem solving. Recommend continued acute OT services to maximize patient safety and independence with self care.    Follow Up Recommendations  CIR;Supervision/Assistance - 24 hour    Equipment Recommendations  Other (comment)(defer to next venue)       Precautions / Restrictions Precautions Precautions: Fall;Other (comment) Precaution Comments: watch 02, right AKA Restrictions Weight Bearing Restrictions: No      Mobility Bed Mobility Overal bed mobility: Needs Assistance Bed Mobility: Supine to Sit     Supine to sit: Mod assist;+2 for physical assistance;HOB elevated     General bed mobility comments: Assist to scoot bottom to EOB, cues for sequencing and to use rail.  Transfers Overall transfer level: Needs assistance Equipment used: None Transfers: Lateral/Scoot Transfers          Lateral/Scoot Transfers: Max assist;+2 physical assistance General transfer comment: Assist to laterally scoot towards left with multiple scoots needed with help of pad; increased time, rest breaks needed for SOB.    Balance Overall balance assessment: Needs  assistance Sitting-balance support: Bilateral upper extremity supported Sitting balance-Leahy Scale: Fair Sitting balance - Comments: Supervision for safety.                                   ADL either performed or assessed with clinical judgement   ADL Overall ADL's : Needs assistance/impaired Eating/Feeding: Set up;Sitting Eating/Feeding Details (indicate cue type and reason): requires assist opening containers, cutting up food Grooming: Set up;Sitting   Upper Body Bathing: Set up;Sitting   Lower Body Bathing: Maximal assistance;Sitting/lateral leans   Upper Body Dressing : Set up;Sitting   Lower Body Dressing: Maximal assistance;Sitting/lateral leans   Toilet Transfer: Maximal assistance;+2 for safety/equipment;+2 for physical assistance;BSC Toilet Transfer Details (indicate cue type and reason): simulated to recliner chair, lateral scoots Toileting- Clothing Manipulation and Hygiene: Total assistance;Sitting/lateral lean       Functional mobility during ADLs: Maximal assistance;+2 for physical assistance;+2 for safety/equipment;Cueing for safety;Cueing for sequencing General ADL Comments: at baseline patient's DTR assists with bathing, dressing. Pt was able to ambulate to bathroom and toilet himself                   Pertinent Vitals/Pain Pain Assessment: Faces Faces Pain Scale: Hurts even more Pain Location: right residual limb with transfers Pain Descriptors / Indicators: Grimacing;Guarding;Operative site guarding Pain Intervention(s): Limited activity within patient's tolerance;Monitored during session     Hand Dominance Right   Extremity/Trunk Assessment Upper Extremity Assessment Upper Extremity Assessment: Generalized weakness   Lower Extremity Assessment Lower Extremity Assessment: Defer to PT evaluation    Cervical / Trunk Assessment Cervical / Trunk Assessment:  Kyphotic   Communication Communication Communication: HOH    Cognition Arousal/Alertness: Awake/alert Behavior During Therapy: WFL for tasks assessed/performed Overall Cognitive Status: Impaired/Different from baseline Area of Impairment: Orientation;Memory;Following commands;Problem solving                 Orientation Level: Disoriented to;Place;Time;Situation   Memory: Decreased short-term memory Following Commands: Follows one step commands with increased time     Problem Solving: Slow processing;Decreased initiation;Difficulty sequencing;Requires verbal cues;Requires tactile cues General Comments: "N[place]" "December" Oriented to self only. HOH.   General Comments  pt using mask for O2 upon arrival maintaining low 90s on 8L, transitioned to nasal cannula while eating breakfast per RN orders            Home Living Family/patient expects to be discharged to:: Private residence Living Arrangements: Children Available Help at Discharge: Family;Available PRN/intermittently Type of Home: House Home Access: Stairs to enter [employer] of Steps: a couple, 5 from last admission Entrance Stairs-Rails: Can reach both Home Layout: Two level;Bed/bath upstairs Alternate Level Stairs-Number of Steps: 1 flight Alternate Level Stairs-Rails: Right Bathroom Shower/Tub: Tub/shower unit;Walk-in shower   Bathroom Toilet: Standard     Home Equipment: Cane - single point;Walker - 4 wheels;Shower seat;Grab bars - tub/shower          Prior Functioning/Environment Level of Independence: Needs assistance  Gait / Transfers Assistance Needed: Uses Rollator for ambulation; also has a SPC. ADL's / Homemaking Assistance Needed: Daughter assists with ADLs and does IADLs.            OT Problem List: Decreased strength;Decreased activity tolerance;Impaired balance (sitting and/or standing);Decreased cognition;Decreased safety awareness;Decreased knowledge of use of DME or AE;Pain      OT Treatment/Interventions: Self-care/ADL  training;Therapeutic exercise;DME and/or AE instruction;Therapeutic activities;Cognitive remediation/compensation;Patient/family education;Balance training    OT Goals(Current goals can be found in the care plan section) Acute Rehab OT Goals Patient Stated Goal: "see my daughter" OT Goal Formulation: With patient Time For Goal Achievement: 01/13/20 Potential to Achieve Goals: Good  OT Frequency: Min 2X/week           Co-evaluation PT/OT/SLP Co-Evaluation/Treatment: Yes Reason for Co-Treatment: Complexity of the patient's impairments (multi-system involvement);Necessary to address cognition/behavior during functional activity;For patient/therapist safety;To address functional/ADL transfers PT goals addressed during session: Mobility/safety with mobility;Balance OT goals addressed during session: ADL's and self-care      AM-PAC OT "6 Clicks" Daily Activity     Outcome Measure Help from another person eating meals?: A Little Help from another person taking care of personal grooming?: A Little Help from another person toileting, which includes using toliet, bedpan, or urinal?: Total Help from another person bathing (including washing, rinsing, drying)?: A Lot Help from another person to put on and taking off regular upper body clothing?: A Little Help from another person to put on and taking off regular lower body clothing?: A Lot 6 Click Score: 14   End of Session Equipment Utilized During Treatment: Gait belt;Oxygen Nurse Communication: Mobility status  Activity Tolerance: Patient tolerated treatment well Patient left: in chair;with call bell/phone within reach  OT Visit Diagnosis: Other abnormalities of gait and mobility (R26.89);Muscle weakness (generalized) (M62.81);Other symptoms and signs involving cognitive function;Pain Pain - Right/Left: Right Pain - part of body: Leg                Time: 7412-8786 OT Time Calculation (min): 34 min Charges:  OT General Charges $OT  Visit: 1 Visit OT Evaluation $OT Eval Moderate Complexity: 1 Mod  [place] OT office: [place] 12/30/2019, 11:49 AM

## 2019-12-30 NOTE — Progress Notes (Signed)
Inpatient Diabetes Program Recommendations  AACE/ADA: New Consensus Statement on Inpatient Glycemic Control (2015)  Target Ranges:  Prepandial:   less than 140 mg/dL      Peak postprandial:   less than 180 mg/dL (1-2 hours)      Critically ill patients:  140 - 180 mg/dL   Lab Results  Component Value Date   GLUCAP 290 (H) 12/30/2019   HGBA1C 8.3 (H) 12/29/2019    Review of Glycemic Control Results for [facility] (MRN [id number]) as of 12/30/2019 15:05  Ref. Range 12/29/2019 10:58 12/29/2019 13:33 12/29/2019 16:09 12/29/2019 21:30 12/30/2019 06:09 12/30/2019 11:04  Glucose-Capillary Latest Ref Range: 70 - 99 mg/dL 185 (H) 201 (H) 169 (H) 135 (H) 215 (H) 290 (H)   Diabetes history: DM 2 Outpatient Diabetes medications: Metformin 1000 mg bid Current orders for Inpatient glycemic control:  Novolog 0-15 units tid  A1c 8.3% on 1/7  Inpatient Diabetes Program Recommendations:    Consider adding Levemir 8 units Q24 hours.  Thanks,  Tama Headings RN, MSN, BC-ADM Inpatient Diabetes Coordinator Team Pager [phone] (8a-5p)

## 2019-12-30 NOTE — Evaluation (Signed)
Physical Therapy Evaluation Patient Details Name: Henry Wiggins MRN: [id number] DOB: Jan 22, 1927 Today's Date: 12/30/2019   History of Present Illness  Patient is a 86 y/o male who presents s/p right AKA 12/29/19. PMH includes CVA, HTN, HLD, memory loss, DM, depression, bladder ca.  Clinical Impression  Patient presents with generalized weakness, post op deficits right residual limb, dyspnea at rest and worsened with exertion, decreased activity tolerance and impaired mobility s/p above. Pt reports daughter assists with ADLs at baseline and pt uses rollator for ambulation. Today, pt requires Max A of 2 to laterally scoot into chair with max cues and assist. Required frequent rest breaks due to SOB. Sp02 remained >87% on 8L non-rebreather. Donned 02 [place] so pt could eat breakfast. Pt oriented to self only. Education re: phantom limb pain. Would benefit from post acute rehab to maximize independence and mobility prior to return home. Will follow acutely.    Follow Up Recommendations CIR;Supervision for mobility/OOB;Supervision/Assistance - 24 hour    Equipment Recommendations  Other (comment)(TBA)    Recommendations for Other Services       Precautions / Restrictions Precautions Precautions: Fall;Other (comment) Precaution Comments: watch 02, right AKA Restrictions Weight Bearing Restrictions: No      Mobility  Bed Mobility Overal bed mobility: Needs Assistance Bed Mobility: Supine to Sit     Supine to sit: Mod assist;+2 for physical assistance;HOB elevated     General bed mobility comments: Assist to scoot bottom to EOB, cues for sequencing and to use rail.  Transfers Overall transfer level: Needs assistance Equipment used: None Transfers: Lateral/Scoot Transfers          Lateral/Scoot Transfers: Max assist;+2 physical assistance General transfer comment: Assist to laterally scoot towards left with multiple scoots needed with help of pad; increased time, rest breaks needed  for SOB.  Ambulation/Gait             General Gait Details: Unable  Stairs            Wheelchair Mobility    Modified Rankin (Stroke Patients Only)       Balance Overall balance assessment: Needs assistance Sitting-balance support: Bilateral upper extremity supported(foot supported) Sitting balance-Leahy Scale: Fair Sitting balance - Comments: Supervision for safety.                                     Pertinent Vitals/Pain Pain Assessment: Faces Faces Pain Scale: Hurts even more Pain Location: right residual limb with transfers Pain Descriptors / Indicators: Grimacing;Guarding;Operative site guarding Pain Intervention(s): Repositioned;Monitored during session    [place] expects to be discharged to:: Private residence Living Arrangements: Children(daughter) Available Help at Discharge: Family;Available PRN/intermittently Type of Home: House Home Access: Stairs to enter Entrance Stairs-Rails: Can reach both Entrance Stairs-Number of Steps: a couple, 5 from last admission Home Layout: Two level;Bed/bath upstairs Home Equipment: Cane - single point;Walker - 4 wheels;Shower seat;Grab bars - tub/shower      Prior Function Level of Independence: Needs assistance   Gait / Transfers Assistance Needed: Uses Rollator for ambulation; also has a SPC.  ADL's / Homemaking Assistance Needed: Daughter assists with ADLs and does IADLs.        Hand Dominance   Dominant Hand: Right    Extremity/Trunk Assessment   Upper Extremity Assessment Upper Extremity Assessment: Defer to OT evaluation    Lower Extremity Assessment Lower Extremity Assessment: RLE deficits/detail RLE Deficits / Details:  Limited AROM right residual limb, trace hip flexion noted RLE Sensation: decreased light touch RLE Coordination: decreased fine motor;decreased gross motor    Cervical / Trunk Assessment Cervical / Trunk Assessment: Kyphotic   Communication   Communication: HOH  Cognition Arousal/Alertness: Awake/alert Behavior During Therapy: WFL for tasks assessed/performed Overall Cognitive Status: Impaired/Different from baseline Area of Impairment: Orientation;Memory;Following commands;Problem solving                 Orientation Level: Disoriented to;Place;Time;Situation   Memory: Decreased short-term memory Following Commands: Follows one step commands with increased time     Problem Solving: Slow processing;Decreased initiation;Difficulty sequencing;Requires verbal cues;Requires tactile cues General Comments: "[place]" "December" Oriented to self only. HOH.      General Comments General comments (skin integrity, edema, etc.): Sp02 remained >87% on 8L non rebreather. Donned 02 [place] at end of session so pt could eat breakfast.    Exercises     Assessment/Plan    PT Assessment Patient needs continued PT services  PT Problem List Decreased strength;Decreased mobility;Pain;Impaired sensation;Decreased balance;Decreased activity tolerance;Decreased skin integrity;Cardiopulmonary status limiting activity;Decreased cognition;Decreased knowledge of precautions;Decreased range of motion       PT Treatment Interventions Therapeutic activities;Gait training;Therapeutic exercise;Patient/family education;Balance training;Wheelchair mobility training;Neuromuscular re-education;Functional mobility training;DME instruction;Cognitive remediation    PT Goals (Current goals can be found in the Care Plan section)  Acute Rehab PT Goals Patient Stated Goal: none stated PT Goal Formulation: With patient Time For Goal Achievement: 01/13/20 Potential to Achieve Goals: Fair    Frequency Min 3X/week   Barriers to discharge Inaccessible home environment not sure of level of support at home    Co-evaluation PT/OT/SLP Co-Evaluation/Treatment: Yes Reason for Co-Treatment: Necessary to address cognition/behavior during  functional activity;Complexity of the patient's impairments (multi-system involvement);For patient/therapist safety;To address functional/ADL transfers PT goals addressed during session: Mobility/safety with mobility;Balance         AM-PAC PT "6 Clicks" Mobility  Outcome Measure Help needed turning from your back to your side while in a flat bed without using bedrails?: A Lot Help needed moving from lying on your back to sitting on the side of a flat bed without using bedrails?: A Lot Help needed moving to and from a bed to a chair (including a wheelchair)?: Total Help needed standing up from a chair using your arms (e.g., wheelchair or bedside chair)?: Total Help needed to walk in hospital room?: Total Help needed climbing 3-5 steps with a railing? : Total 6 Click Score: 8    End of Session Equipment Utilized During Treatment: Gait belt;Oxygen Activity Tolerance: Patient limited by fatigue;Patient tolerated treatment well Patient left: in chair;with call bell/phone within reach(chair alarm pad under pt but no box. RN notified) Nurse Communication: Mobility status;Other (comment)(tx technique back to bed) PT Visit Diagnosis: Pain;Muscle weakness (generalized) (M62.81) Pain - Right/Left: Right Pain - part of body: Leg    Time: 0768-0881 PT Time Calculation (min) (ACUTE ONLY): 34 min   Charges:   PT Evaluation $PT Eval Moderate Complexity: 1 Mod          Marisa Severin, PT, DPT Acute Rehabilitation Services Pager [phone] Office [phone]      Marguarite Arbour [place] 12/30/2019, 10:03 AM

## 2019-12-30 NOTE — Progress Notes (Addendum)
     Subjective  - No new complaints.  Appears to be working some to breath.  On O2 with SATs in the high 80's to low 90's.     Objective (!) 138/59 88 98.4 F (36.9 C) (Oral) 20 90%  Intake/Output Summary (Last 24 hours) at 12/30/2019 0086 Last data filed at 12/30/2019 0423 Gross per 24 hour  Intake 1558.97 ml  Output 660 ml  Net 898.97 ml    Right BKA dressing is clean and dry O2 87 on 8 L with minimal labored breathing.  BP stable HR stable. Heart RRR A & O   Assessment/Planning: POD # 1 right BKA  Will consult respiratory therapy for assistants Chest x ray, ABG Maintain O2 SATs above 90 with 8L.  He is alert and answering questions.  Reduced to 3L @1100  tolerating and maintaining SAT's of87-92 No history of COPD He has not had any pain medication, so that's not an issue.   I will d/c oxycodone just in case and add tramadol for pain.   No history of CHF, Hx of CKD Cr at baseline 1.76 down from yesterday of 2.02.  UO 650 cc last 24 hours.  Chest x ray IMPRESSION: Cardiomegaly. Diffuse bilateral interstitial prominence and small right pleural effusions. Findings suggest CHF. Pneumonitis could also present this fashion.  Hep lock fluids and gave 40 of Lasix, then 20 daily.  Pending CIR consult recommended by PT/OT  [name] 12/30/2019 7:12 AM --  Laboratory Lab Results: Recent Labs    12/29/19 1120 12/30/19 0243  WBC 15.2* 12.5*  HGB 11.0* 10.6*  HCT 34.5* 33.4*  PLT 257 343   BMET Recent Labs    12/29/19 1120 12/30/19 0243  NA 139 138  K 5.7* 5.2*  CL 105 102  CO2 22 22  GLUCOSE 208* 211*  BUN 48* 45*  CREATININE 2.02* 1.76*  CALCIUM 9.0 8.5*    COAG Lab Results  Component Value Date   INR 1.2 12/29/2019   INR 1.14 05/26/2015   No results found for: PTT   I have independently interviewed and examined patient and agree with PA assessment and plan above.  On my exam patient is much more comfortable this afternoon. Fluids  have been stopped and lasix given. Family does not want inpatient rehab and desires to take him home when medically suitable.  Lavalle Skoda C. Donzetta Matters, MD [facility] Office: [phone] Pager: [phone]

## 2019-12-31 ENCOUNTER — Inpatient Hospital Stay (HOSPITAL_COMMUNITY): Payer: Medicare HMO

## 2019-12-31 DIAGNOSIS — I5031 Acute diastolic (congestive) heart failure: Secondary | ICD-10-CM

## 2019-12-31 DIAGNOSIS — N189 Chronic kidney disease, unspecified: Secondary | ICD-10-CM

## 2019-12-31 DIAGNOSIS — N179 Acute kidney failure, unspecified: Secondary | ICD-10-CM

## 2019-12-31 DIAGNOSIS — I4819 Other persistent atrial fibrillation: Secondary | ICD-10-CM

## 2019-12-31 DIAGNOSIS — I361 Nonrheumatic tricuspid (valve) insufficiency: Secondary | ICD-10-CM

## 2019-12-31 DIAGNOSIS — J9601 Acute respiratory failure with hypoxia: Secondary | ICD-10-CM

## 2019-12-31 DIAGNOSIS — I739 Peripheral vascular disease, unspecified: Secondary | ICD-10-CM

## 2019-12-31 DIAGNOSIS — J810 Acute pulmonary edema: Secondary | ICD-10-CM

## 2019-12-31 DIAGNOSIS — I4891 Unspecified atrial fibrillation: Secondary | ICD-10-CM

## 2019-12-31 DIAGNOSIS — I340 Nonrheumatic mitral (valve) insufficiency: Secondary | ICD-10-CM

## 2019-12-31 DIAGNOSIS — I9789 Other postprocedural complications and disorders of the circulatory system, not elsewhere classified: Secondary | ICD-10-CM

## 2019-12-31 LAB — BLOOD GAS, ARTERIAL
Acid-Base Excess: 1.7 mmol/L (ref 0.0–2.0)
Acid-base deficit: 0.7 mmol/L (ref 0.0–2.0)
Bicarbonate: 22.6 mmol/L (ref 20.0–28.0)
Bicarbonate: 24.8 mmol/L (ref 20.0–28.0)
Drawn by: 34719
FIO2: 40
FIO2: 40
O2 Saturation: 91 %
O2 Saturation: 90 %
Patient temperature: 37
Patient temperature: 37
pCO2 arterial: 31.7 mmHg — ABNORMAL LOW (ref 32.0–48.0)
pCO2 arterial: 33.3 mmHg (ref 32.0–48.0)
pH, Arterial: 7.466 — ABNORMAL HIGH (ref 7.350–7.450)
pH, Arterial: 7.485 — ABNORMAL HIGH (ref 7.350–7.450)
pO2, Arterial: 55.3 mmHg — ABNORMAL LOW (ref 83.0–108.0)
pO2, Arterial: 55 mmHg — ABNORMAL LOW (ref 83.0–108.0)

## 2019-12-31 LAB — BASIC METABOLIC PANEL
Anion gap: 12 (ref 5–15)
BUN: 48 mg/dL — ABNORMAL HIGH (ref 8–23)
CO2: 24 mmol/L (ref 22–32)
Calcium: 8.9 mg/dL (ref 8.9–10.3)
Chloride: 102 mmol/L (ref 98–111)
Creatinine, Ser: 1.96 mg/dL — ABNORMAL HIGH (ref 0.61–1.24)
GFR calc Af Amer: 34 mL/min — ABNORMAL LOW (ref >60)
GFR calc non Af Amer: 29 mL/min — ABNORMAL LOW (ref >60)
Glucose, Bld: 218 mg/dL — ABNORMAL HIGH (ref 70–99)
Potassium: 4.7 mmol/L (ref 3.5–5.1)
Sodium: 138 mmol/L (ref 135–145)

## 2019-12-31 LAB — CBC
HCT: 36.3 % — ABNORMAL LOW (ref 39.0–52.0)
Hemoglobin: 11.5 g/dL — ABNORMAL LOW (ref 13.0–17.0)
MCH: 28.9 pg (ref 26.0–34.0)
MCHC: 31.7 g/dL (ref 30.0–36.0)
MCV: 91.2 fL (ref 80.0–100.0)
Platelets: 384 10*3/uL (ref 150–400)
RBC: 3.98 MIL/uL — ABNORMAL LOW (ref 4.22–5.81)
RDW: 13.7 % (ref 11.5–15.5)
WBC: 11.1 10*3/uL — ABNORMAL HIGH (ref 4.0–10.5)
nRBC: 0 % (ref 0.0–0.2)

## 2019-12-31 LAB — TROPONIN I (HIGH SENSITIVITY)
Troponin I (High Sensitivity): 293 ng/L (ref <18)
Troponin I (High Sensitivity): 552 ng/L (ref <18)
Troponin I (High Sensitivity): 596 ng/L (ref <18)
Troponin I (High Sensitivity): 650 ng/L (ref <18)

## 2019-12-31 LAB — ECHOCARDIOGRAM COMPLETE
Height: 69 in
Weight: 3200 oz

## 2019-12-31 LAB — COMPREHENSIVE METABOLIC PANEL
ALT: 17 U/L (ref 0–44)
AST: 32 U/L (ref 15–41)
Albumin: 2.5 g/dL — ABNORMAL LOW (ref 3.5–5.0)
Alkaline Phosphatase: 85 U/L (ref 38–126)
Anion gap: 12 (ref 5–15)
BUN: 55 mg/dL — ABNORMAL HIGH (ref 8–23)
CO2: 22 mmol/L (ref 22–32)
Calcium: 8.7 mg/dL — ABNORMAL LOW (ref 8.9–10.3)
Chloride: 102 mmol/L (ref 98–111)
Creatinine, Ser: 2.1 mg/dL — ABNORMAL HIGH (ref 0.61–1.24)
GFR calc Af Amer: 31 mL/min — ABNORMAL LOW (ref >60)
GFR calc non Af Amer: 27 mL/min — ABNORMAL LOW (ref >60)
Glucose, Bld: 261 mg/dL — ABNORMAL HIGH (ref 70–99)
Potassium: 5.2 mmol/L — ABNORMAL HIGH (ref 3.5–5.1)
Sodium: 136 mmol/L (ref 135–145)
Total Bilirubin: 1.2 mg/dL (ref 0.3–1.2)
Total Protein: 6.4 g/dL — ABNORMAL LOW (ref 6.5–8.1)

## 2019-12-31 LAB — URINALYSIS, ROUTINE W REFLEX MICROSCOPIC
Bilirubin Urine: NEGATIVE
Glucose, UA: NEGATIVE mg/dL
Hgb urine dipstick: NEGATIVE
Ketones, ur: NEGATIVE mg/dL
Leukocytes,Ua: NEGATIVE
Nitrite: NEGATIVE
Protein, ur: NEGATIVE mg/dL
Specific Gravity, Urine: 1.009 (ref 1.005–1.030)
pH: 5 (ref 5.0–8.0)

## 2019-12-31 LAB — GLUCOSE, CAPILLARY
Glucose-Capillary: 176 mg/dL — ABNORMAL HIGH (ref 70–99)
Glucose-Capillary: 260 mg/dL — ABNORMAL HIGH (ref 70–99)
Glucose-Capillary: 218 mg/dL — ABNORMAL HIGH (ref 70–99)
Glucose-Capillary: 217 mg/dL — ABNORMAL HIGH (ref 70–99)
Glucose-Capillary: 249 mg/dL — ABNORMAL HIGH (ref 70–99)

## 2019-12-31 LAB — BRAIN NATRIURETIC PEPTIDE: B Natriuretic Peptide: 1620 pg/mL — ABNORMAL HIGH (ref 0.0–100.0)

## 2019-12-31 LAB — MAGNESIUM: Magnesium: 2.3 mg/dL (ref 1.7–2.4)

## 2019-12-31 MED ORDER — FUROSEMIDE 10 MG/ML IJ SOLN
20.0000 mg | Freq: Once | INTRAMUSCULAR | Status: AC
  Administered 2019-12-31: 20 mg via INTRAVENOUS
  Filled 2019-12-31: qty 2

## 2019-12-31 MED ORDER — FUROSEMIDE 10 MG/ML IJ SOLN
80.0000 mg | Freq: Two times a day (BID) | INTRAMUSCULAR | Status: DC
  Administered 2019-12-31 (×2): 80 mg via INTRAVENOUS
  Filled 2019-12-31 (×2): qty 8

## 2019-12-31 MED ORDER — AMIODARONE HCL IN DEXTROSE 360-4.14 MG/200ML-% IV SOLN
30.0000 mg/h | INTRAVENOUS | Status: DC
  Administered 2019-12-31 – 2020-01-02 (×5): 30 mg/h via INTRAVENOUS
  Filled 2019-12-31 (×5): qty 200

## 2019-12-31 MED ORDER — AMIODARONE HCL IN DEXTROSE 360-4.14 MG/200ML-% IV SOLN
60.0000 mg/h | INTRAVENOUS | Status: DC
  Administered 2019-12-31: 60 mg/h via INTRAVENOUS

## 2019-12-31 MED ORDER — TECHNETIUM TO 99M ALBUMIN AGGREGATED
1.5900 | Freq: Once | INTRAVENOUS | Status: AC | PRN
  Administered 2019-12-31: 10:00:00 1.59 via INTRAVENOUS

## 2019-12-31 MED ORDER — AMIODARONE LOAD VIA INFUSION
150.0000 mg | Freq: Once | INTRAVENOUS | Status: AC
  Administered 2019-12-31: 12:00:00 150 mg via INTRAVENOUS
  Filled 2019-12-31: qty 83.34

## 2019-12-31 NOTE — Consult Note (Addendum)
Medical Consultation   Henry Wiggins  IWL:[id number]  DOB: 1927/05/09  DOA: 12/29/2019  PCP: Antony Contras, MD    Requesting physician: Dr. Trula Slade  Reason for consultation: Respiratory failure with hypoxia   History of Present Illness: Henry Wiggins is an 86 y.o. male with past medical history significant for hypertension, hyperlipidemia, CVA, PVD, and DM type II; who presented for progressive ischemia requiring above-knee amputation on 12/29/2019 by Dr. Donzetta Matters.  Since the procedure the patient had progressively worsening shortness of breath.  He had been initially given IV Lasix after chest x-ray revealed interstitial opacities concerning for edema.  This morning patient however developed worsening respiratory distress and was placed on a Venturi mask.  Patient does not provide give any additional history.  TRH consult to help assist in evaluation of patient's respiratory failure.   Review of Systems  Unable to perform ROS: Acuity of condition   As per HPI otherwise 10 point review of systems negative.    Past Medical History: Past Medical History:  Diagnosis Date  . Arthritis   . Bladder cancer (HCC)    Dr. Janice Norrie  . Depression   . Diabetes ([place])    type 2  . Family history of adverse reaction to anesthesia    Daughter stated that she is difficult to wake up.slow to wake up  . Full dentures   . GERD (gastroesophageal reflux disease)   . HOH (hard of hearing)   . Hyperlipidemia   . Hypertension   . Memory loss   . Stroke ([place])   . UTI (lower urinary tract infection)     Past Surgical History: Past Surgical History:  Procedure Laterality Date  . ABDOMINAL AORTOGRAM W/LOWER EXTREMITY Bilateral 12/19/2019   Procedure: ABDOMINAL AORTOGRAM W/LOWER EXTREMITY;  Surgeon: Waynetta Sandy, MD;  Location: [facility];  Service: Cardiovascular;  Laterality: Bilateral;  . AMPUTATION Right 12/29/2019   Procedure: AMPUTATION ABOVE KNEE;  Surgeon: Waynetta Sandy, MD;  Location: [address];  Service: Vascular;  Laterality: Right;  . APPENDECTOMY    . CARPAL TUNNEL RELEASE Right   . CATARACT EXTRACTION Bilateral   . CHOLECYSTECTOMY    . COLONOSCOPY    . EP IMPLANTABLE DEVICE N/A 05/28/2015   Procedure: Loop Recorder Insertion;  Surgeon: Sanda Klein, MD;  Location: [facility];  Service: Cardiovascular;  Laterality: N/A;  . HERNIA REPAIR     x4  . MULTIPLE TOOTH EXTRACTIONS    . PERIPHERAL VASCULAR BALLOON ANGIOPLASTY Right 12/19/2019   Procedure: PERIPHERAL VASCULAR BALLOON ANGIOPLASTY;  Surgeon: Waynetta Sandy, MD;  Location: [facility];  Service: Cardiovascular;  Laterality: Right;  Anterior tibial artery  . TEE WITHOUT CARDIOVERSION N/A 05/28/2015   Procedure: TRANSESOPHAGEAL ECHOCARDIOGRAM (TEE);  Surgeon: Sanda Klein, MD;  Location: [facility];  Service: Cardiovascular;  Laterality: N/A;  . TONSILLECTOMY       Allergies:  No Known Allergies   Social History:  reports that he quit smoking about 66 years ago. His smoking use included cigarettes. He has a 2.50 pack-year smoking history. He has never used smokeless tobacco. He reports previous alcohol use. He reports current drug use. Drug: Oxycodone.   Family History: Family History  Problem Relation Age of Onset  . Hypertension Mother   . Hypertension Father      Physical Exam: Vitals:   12/31/19 0834 12/31/19 1133 12/31/19 1200 12/31/19 1600  BP: (!) 108/56 (!) 121/91 116/71 128/84  Pulse: 89 82 81 80  Resp: 20 (!) 29 20 (!) 28  Temp: 98.7 F (37.1 C) 98 F (36.7 C)  98.4 F (36.9 C)  TempSrc: Oral Oral  Oral  SpO2: 95% 92% 92% 92%  Weight:      Height:        Constitutional: Elderly male who appears to be in some respiratory distress Eyes: PERLA, EOMI, irises appear normal, anicteric sclera,  ENMT: external ears and nose appear normal, hard of hearing Neck: neck appears normal, no masses, normal ROM, no thyromegaly,  positive JVD appreciated. CVS: Irregular rhythm, no left LE edema Respiratory: Tachypneic with decreased sounds and mild crackles heard in the bilateral lung fields.  Currently not on Venturi facemask with O2 saturation maintained around 91%. Abdomen: soft nontender, nondistended, normal bowel sounds, no hepatosplenomegaly, no hernias  Musculoskeletal: Right AKA Neuro: Cranial nerves II-XII intact, strength, sensation, reflexes Psych: judgement and insight appear normal, stable mood and affect, mental status Skin: no rashes or lesions or ulcers, no induration or nodules    Data reviewed:  I have personally reviewed following labs and imaging studies Labs:  CBC: Recent Labs  Lab 12/29/19 1120 12/30/19 0243 12/31/19 0139  WBC 15.2* 12.5* 11.1*  HGB 11.0* 10.6* 11.5*  HCT 34.5* 33.4* 36.3*  MCV 93.8 94.1 91.2  PLT 257 343 683    Basic Metabolic Panel: Recent Labs  Lab 12/29/19 1120 12/30/19 0243 12/31/19 0139 12/31/19 0815  NA 139 138 138 136  K 5.7* 5.2* 4.7 5.2*  CL 105 102 102 102  CO2 22 22 24 22   GLUCOSE 208* 211* 218* 261*  BUN 48* 45* 48* 55*  CREATININE 2.02* 1.76* 1.96* 2.10*  CALCIUM 9.0 8.5* 8.9 8.7*  MG  --   --   --  2.3   GFR Estimated Creatinine Clearance: 26 mL/min (A) (by C-G formula based on SCr of 2.1 mg/dL (H)). Liver Function Tests: Recent Labs  Lab 12/29/19 1120 12/31/19 0815  AST 74* 32  ALT 65* 17  ALKPHOS 81 85  BILITOT 2.3* 1.2  PROT 6.6 6.4*  ALBUMIN 2.7* 2.5*   No results for input(s): LIPASE, AMYLASE in the last 168 hours. No results for input(s): AMMONIA in the last 168 hours. Coagulation profile Recent Labs  Lab 12/29/19 1217  INR 1.2    Cardiac Enzymes: No results for input(s): CKTOTAL, CKMB, CKMBINDEX, TROPONINI in the last 168 hours. BNP: Invalid input(s): POCBNP CBG: Recent Labs  Lab 12/30/19 2244 12/31/19 0346 12/31/19 0639 12/31/19 1130 12/31/19 1618  GLUCAP 311* 176* 260* 218* 217*   D-Dimer No results  for input(s): DDIMER in the last 72 hours. Hgb A1c Recent Labs    12/29/19 1530  HGBA1C 8.3*   Lipid Profile No results for input(s): CHOL, HDL, LDLCALC, TRIG, CHOLHDL, LDLDIRECT in the last 72 hours. Thyroid function studies No results for input(s): TSH, T4TOTAL, T3FREE, THYROIDAB in the last 72 hours.  Invalid input(s): FREET3 Anemia work up No results for input(s): VITAMINB12, FOLATE, FERRITIN, TIBC, IRON, RETICCTPCT in the last 72 hours. Urinalysis    Component Value Date/Time   COLORURINE STRAW (A) 12/31/2019 1746   APPEARANCEUR CLEAR 12/31/2019 1746   LABSPEC 1.009 12/31/2019 1746   PHURINE 5.0 12/31/2019 1746   GLUCOSEU NEGATIVE 12/31/2019 1746   HGBUR NEGATIVE 12/31/2019 1746   BILIRUBINUR NEGATIVE 12/31/2019 1746   KETONESUR NEGATIVE 12/31/2019 1746   PROTEINUR NEGATIVE 12/31/2019 1746   UROBILINOGEN 0.2 05/27/2015 0223  NITRITE NEGATIVE 12/31/2019 [place] 12/31/2019 1746     Microbiology Recent Results (from the past 240 hour(s))  SARS CORONAVIRUS 2 (TAT 6-24 HRS) Nasopharyngeal Nasopharyngeal Swab     Status: None   Collection Time: 12/28/19  2:46 PM   Specimen: Nasopharyngeal Swab  Result Value Ref Range Status   SARS Coronavirus 2 NEGATIVE NEGATIVE Final    Comment: (NOTE) SARS-CoV-2 target nucleic acids are NOT DETECTED. The SARS-CoV-2 RNA is generally detectable in upper and lower respiratory specimens during the acute phase of infection. Negative results do not preclude SARS-CoV-2 infection, do not rule out co-infections with other pathogens, and should not be used as the sole basis for treatment or other patient management decisions. Negative results must be combined with clinical observations, patient history, and epidemiological information. The expected result is Negative. Fact Sheet for Patients: SugarRoll.be Fact Sheet for Healthcare Providers: https://www.woods-mathews.com/ This  test is not yet approved or cleared by the [place] FDA and  has been authorized for detection and/or diagnosis of SARS-CoV-2 by FDA under an Emergency Use Authorization (EUA). This EUA will remain  in effect (meaning this test can be used) for the duration of the COVID-19 declaration under Section 56 4(b)(1) of the Act, 21 U.S.C. section 360bbb-3(b)(1), unless the authorization is terminated or revoked sooner. Performed at [facility], [place] [address], [place], [place] [address]        Inpatient Medications:   Scheduled Meds: . aspirin EC  81 mg Oral Daily  . docusate sodium  100 mg Oral Daily  . furosemide  80 mg Intravenous BID  . heparin  5,000 Units Subcutaneous Q8H  . insulin aspart  0-15 Units Subcutaneous Q6H  . pantoprazole  40 mg Oral Daily   Continuous Infusions: . amiodarone    . magnesium sulfate bolus IVPB       Radiological Exams on Admission: NM Pulmonary Perfusion  Result Date: 12/31/2019 CLINICAL DATA:  Decreased oxygen saturation. Recent lower extremity amputation EXAM: NUCLEAR MEDICINE PERFUSION LUNG SCAN TECHNIQUE: Perfusion images were obtained in multiple projections after intravenous injection of radiopharmaceutical. Ventilation scans intentionally deferred if perfusion scan and chest x-ray adequate for interpretation during COVID 19 epidemic. Views: Anterior, posterior, left lateral, right lateral, RPO, LPO, RAO, LAO RADIOPHARMACEUTICALS:  1.59 mCi Tc-64m MAA IV COMPARISON:  Chest radiograph December 31, 2019 FINDINGS: Radiotracer uptake is homogeneous and symmetric bilaterally. No appreciable perfusion defects. IMPRESSION: No appreciable perfusion defects. Very low probability of pulmonary embolus. Electronically Signed   By: Lowella Grip III M.D.   On: 12/31/2019 10:46   DG CHEST PORT 1 VIEW  Result Date: 12/31/2019 CLINICAL DATA:  Abnormal respiration. EXAM: PORTABLE CHEST 1 VIEW COMPARISON:  12/30/2019 FINDINGS: The patient is rotated to  the right with grossly unchanged cardiomediastinal silhouette. A loop recorder remains in place. Mild diffuse interstitial opacity throughout both lungs and bibasilar airspace opacities have not significantly changed. No sizable pleural effusion or pneumothorax is identified. IMPRESSION: Unchanged appearance bilateral interstitial and airspace opacities suggesting edema. Electronically Signed   By: Logan Bores M.D.   On: 12/31/2019 09:20   DG Chest Port 1V same Day  Result Date: 12/30/2019 CLINICAL DATA:  Oxygen desaturation. EXAM: PORTABLE CHEST 1 VIEW COMPARISON:  08/09/2019. FINDINGS: Cardiomegaly. Diffuse bilateral interstitial prominence and small right pleural effusion. Findings suggest CHF. Pneumonitis could also present this fashion. No pneumothorax. IMPRESSION: Cardiomegaly. Diffuse bilateral interstitial prominence and small right pleural effusions. Findings suggest CHF. Pneumonitis could also present this  fashion. Electronically Signed   By: Marcello Moores  Register   On: 12/30/2019 08:06   ECHOCARDIOGRAM COMPLETE  Result Date: 12/31/2019   ECHOCARDIOGRAM REPORT   Patient Name:   Henry Wiggins Date of Exam: 12/31/2019 Medical Rec #:  [id number]       Height:       69.0 in Accession #:    [id number]      Weight:       200.0 lb Date of Birth:  01/04/1927       BSA:          2.07 m Patient Age:    54 years        BP:           108/56 mmHg Patient Gender: M               HR:           85 bpm. Exam Location:  Inpatient Procedure: 2D Echo, Cardiac Doppler and Color Doppler STAT ECHO Indications:    I50.33 Acute on chronic diastolic (congestive) heart failure  History:        Patient has prior history of Echocardiogram examinations, most                 recent 05/28/2015. Stroke; Risk Factors:Hypertension, Diabetes,                 Dyslipidemia and GERD. Cancer.  Sonographer:    Tiffany Dance Referring Phys: [id number] Emanuel Dowson A Meda Dudzinski IMPRESSIONS  1. Left ventricular ejection fraction, by visual estimation, is 30 to  35%. The left ventricle has moderate to severely decreased function. There is mildly increased left ventricular hypertrophy.  2. Left ventricular diastolic parameters are consistent with Grade II diastolic dysfunction (pseudonormalization).  3. The left ventricle demonstrates regional wall motion abnormalities.  4. Global right ventricle has normal systolic function.The right ventricular size is normal.  5. Left atrial size was mildly dilated.  6. Right atrial size was normal.  7. The mitral valve is normal in structure. Mild mitral valve regurgitation. No evidence of mitral stenosis.  8. The tricuspid valve is normal in structure.  9. The aortic valve is tricuspid. Aortic valve regurgitation is trivial. Mild aortic valve sclerosis without stenosis. 10. The pulmonic valve was normal in structure. Pulmonic valve regurgitation is not visualized. 11. The inferior vena cava is dilated in size with >50% respiratory variability, suggesting right atrial pressure of 8 mmHg. 12. Severe hypokinesis of the distal septum and apex; overall moderate to severe LV dysfunction; grade 2 diastolic dsyfunction; mild LVH; mild LAE; mild MR. FINDINGS  Left Ventricle: Left ventricular ejection fraction, by visual estimation, is 30 to 35%. The left ventricle has moderate to severely decreased function. The left ventricle demonstrates regional wall motion abnormalities. There is mildly increased left ventricular hypertrophy. Left ventricular diastolic parameters are consistent with Grade II diastolic dysfunction (pseudonormalization). Normal left atrial pressure. Right Ventricle: The right ventricular size is normal.Global RV systolic function is has normal systolic function. The tricuspid regurgitant velocity is 2.33 m/s, and with an assumed right atrial pressure of 8 mmHg, the estimated right ventricular systolic pressure is normal at 29.7 mmHg. Left Atrium: Left atrial size was mildly dilated. Right Atrium: Right atrial size was normal  in size Pericardium: There is no evidence of pericardial effusion. Mitral Valve: The mitral valve is normal in structure. Mild mitral valve regurgitation. No evidence of mitral valve stenosis by observation. Tricuspid Valve: The tricuspid valve is normal in structure.  Tricuspid valve regurgitation is mild. Aortic Valve: The aortic valve is tricuspid. Aortic valve regurgitation is trivial. Aortic regurgitation PHT measures 466 msec. Mild aortic valve sclerosis is present, with no evidence of aortic valve stenosis. Pulmonic Valve: The pulmonic valve was normal in structure. Pulmonic valve regurgitation is not visualized. Pulmonic regurgitation is not visualized. Aorta: The aortic root is normal in size and structure. Venous: The inferior vena cava is dilated in size with greater than 50% respiratory variability, suggesting right atrial pressure of 8 mmHg. IAS/Shunts: No atrial level shunt detected by color flow Doppler. Additional Comments: Severe hypokinesis of the distal septum and apex; overall moderate to severe LV dysfunction; grade 2 diastolic dsyfunction; mild LVH; mild LAE; mild MR.  LEFT VENTRICLE PLAX 2D LVIDd:         5.16 cm LVIDs:         4.71 cm LV PW:         1.21 cm LV IVS:        1.18 cm LVOT diam:     2.20 cm LV SV:         24 ml LV SV Index:   11.46 LVOT Area:     3.80 cm  RIGHT VENTRICLE             IVC RV Basal diam:  3.46 cm     IVC diam: 2.36 cm RV Mid diam:    1.71 cm RV S prime:     11.90 cm/s TAPSE (M-mode): 2.0 cm LEFT ATRIUM             Index       RIGHT ATRIUM           Index LA diam:        5.10 cm 2.47 cm/m  RA Area:     20.00 cm LA Vol (A2C):   83.0 ml 40.17 ml/m RA Volume:   59.70 ml  28.90 ml/m LA Vol (A4C):   67.7 ml 32.77 ml/m LA Biplane Vol: 76.5 ml 37.03 ml/m  AORTIC VALVE LVOT Vmax:   62.05 cm/s LVOT Vmean:  39.250 cm/s LVOT VTI:    0.096 m AI PHT:      466 msec  AORTA Ao Root diam: 3.70 cm Ao Asc diam:  3.50 cm MITRAL VALVE                        TRICUSPID VALVE MV Area  (PHT): 4.39 cm             TR Peak grad:   21.7 mmHg MV PHT:        50.17 msec           TR Vmax:        233.00 cm/s MV Decel Time: 173 msec MV E velocity: 98.50 cm/s 103 cm/s  SHUNTS MV A velocity: 59.20 cm/s 70.3 cm/s Systemic VTI:  0.10 m MV E/A ratio:  1.66       1.5       Systemic Diam: 2.20 cm  Kirk Ruths MD Electronically signed by Kirk Ruths MD Signature Date/Time: 12/31/2019/1:23:45 PM    Final     Impression/Recommendations Postop day 2 from right AKA -Per vascular surgery  Acute respiratory failure with hypoxia secondary to acute diastolic congestive heart failure: Patient noted to have progressively worsening shortness of breath after his recent amputation.  Chest x-ray showing cardiomegaly with signs of pulmonary edema.  ABG significant for pH 7.485, PCO2 33,  PO2 55.  Question the possibility of PE/fat embolism versus heart attack as the cause of patient heart failure exacerbation -Continuous pulse oximetry with nasal cannula oxygen to maintain O2 saturation greater than 92% -Check BNP(1620)  -Strict intake and output -Daily weight -Check echocardiogram(EF 30-35%) and VQ scan (low risk) -Continue IV Lasix per cardiology  Postoperative atrial fibrillation: Patient was seen to have into atrial fibrillation with heart rates into the 150s.  Given IV metoprolol with some mild improvement.  Cardiology evaluated to be started on IV amiodarone per cardiology. CHA2DS2-VASc score = 8 for which he was recommended to be initiated on anticoagulation.  Daughter denied any previous history of bleeding. -Per cardiology  Elevated troponin: Acute.  Troponin is noted to be elevated trending from 293->500's. -Continue to monitor  Acute kidney injury superimposed on chronic kidney disease stage III: Patient presents with creatinine elevated up to 2.1, but previously had been around 1.5-1.7 in December 2020. -Continue to monitor kidney function with diuresis  Essential hypertension: Blood  pressure medications currently on hold per cardiology  Thank you for this consultation.  Our [facility] hospitalist team will follow the patient with you.   Time Spent: 25 minutes  Norval Morton M.D. [facility] 12/31/2019, 6:07 PM

## 2019-12-31 NOTE — Progress Notes (Signed)
During the night, the pt became increasingly agitated and his breathing was labored.  Respiration rates went up to the 30 to 40s and his Oxygen sats dropped to the low 80s at times while on 5 L Oxygen nasal cannula.  Pt's lung sounds were very rhonchi sounding.  Nurse placed the pt back on the Venturi Mask on 8 L O2 on 40%.  Informed on call vascular surgeon.  Ordered one time IV 20 mg Lasix, blood gas labs drawn, and a chest X-ray this AM.  Will continue to monitor.  Lupita Dawn, RN

## 2019-12-31 NOTE — Progress Notes (Signed)
Pt's heart rate suddenly started racing from 140s to 160s.  EKG confirmed to be in A Fib with RVR. BP stable at 146/81. Quickly gave 5 mg Metoprolol IV, which helped bring the heart rate down to the 110s.  Informed on call vascular surgeon, who said he will assess the pt when he comes to see him.  Will continue to monitor.  Lupita Dawn, RN

## 2019-12-31 NOTE — Plan of Care (Signed)
Poc progressing.  

## 2019-12-31 NOTE — Progress Notes (Signed)
  Echocardiogram 2D Echocardiogram has been performed.  Mike Hamre G Jayshon Dommer 12/31/2019, 11:42 AM

## 2019-12-31 NOTE — Progress Notes (Addendum)
[facility]  Subjective  - Oriented to name.    Objective (!) 146/81 (!) 44 98.6 F (37 C) (Oral) (!) 30 (!) 89%  Intake/Output Summary (Last 24 hours) at 12/31/2019 0813 Last data filed at 12/31/2019 0643 Gross per 24 hour  Intake 340 ml  Output 2000 ml  Net -1660 ml    Right BKA stump healing well Lungs SAT's hight 80's to low 90's on O2 5L full mask, non labored breathing at rest, hard to arouse. Restraints secondary to removing oxygen and trying to climb out of bed Heart A fib new onset     Assessment/Planning: POD #2 Right BKA  He has no history of COPD with O2 supported due to hypoxic  New A fib without history Pending CBC, CMET and troponin's Chest x ray with pulmonary edema without history of CHF We will consult IM for assistant and Dr. Trula Slade will discuss the finding with the family   [name] 12/31/2019 8:13 AM --  Laboratory Lab Results: Recent Labs    12/30/19 0243 12/31/19 0139  WBC 12.5* 11.1*  HGB 10.6* 11.5*  HCT 33.4* 36.3*  PLT 343 384   BMET Recent Labs    12/30/19 0243 12/31/19 0139  NA 138 138  K 5.2* 4.7  CL 102 102  CO2 22 24  GLUCOSE 211* 218*  BUN 45* 48*  CREATININE 1.76* 1.96*  CALCIUM 8.5* 8.9    COAG Lab Results  Component Value Date   INR 1.2 12/29/2019   INR 1.14 05/26/2015   No results found for: PTT  The above.  I have seen and evaluated the patient.  He is postop day #2 from a right leg amputation.  For the second night in a row, he developed a increasing oxygen requirement.  I treated him with further diuresis because his chest x-ray continued to look volume overloaded.  This morning he is still requiring significant oxygen requirement and his PaO2 remains in the 50s.  I suspect that this is related to his volume status and so we will continue with diuresis and pulmonary support.  I have asked the hospitalists to consult.    The patient went into atrial  fibrillation with RVR last night.  Heart rate got up into the 150s.  He was treated with 5 mg of metoprolol which brought his heart rate back down to 100.  He is creeping back up this morning and is in the 120s.  Cardiology consultation has been requested.  Should heparinization be necessary, it would be okay from a vascular standpoint.  I spoke with the patient's daughter, his POA this morning and updated her on his condition.  I confirmed that he is a DNR, however she stated that they overruled that when her mother was having issues.  Of note, the patient's spouse died 1 year ago today.  The daughter plans on being here again this morning at 10 AM.  Annamarie Major

## 2019-12-31 NOTE — Progress Notes (Signed)
Patient's daughter updated via phone again.  Discussed overall situation with her.  The VQ scan was low probability.  He remains stable.  I appreciate hospitalists and cardiology assistance.  Annamarie Major

## 2019-12-31 NOTE — Consult Note (Signed)
Cardiology Consultation:   Patient ID: Henry Wiggins MRN: [id number]; DOB: December 18, 1927  Admit date: 12/29/2019 Date of Consult: 12/31/2019  Primary Care Provider: Antony Contras, MD Primary Cardiologist: Henry Wiggins   Patient Profile:   Henry Wiggins is a 86 y.o. male with a hx of diabetes mellitus, hypertension, hyperlipidemia, prior CVA, peripheral vascular disease and now status post AKA who is being seen today for the evaluation of atrial fibrillation at the request of Henry Trula Slade.  History of Present Illness:   TEE 2016 showed normal LV function, mild AI.  Patient has peripheral vascular disease with history of bilateral lower extremity ischemic ulceration.  He previously underwent attempted revascularization of the right lower extremity but developed progressive ischemia and required AKA on January 7.  Patient has developed progressive dyspnea in atrial fibrillation and cardiology now asked to evaluate.  Patient presently denies dyspnea or chest pain though somnolent.  He is on 60% FiO2.  Heart Pathway Score:     Past Medical History:  Diagnosis Date  . Arthritis   . Bladder cancer (HCC)    Henry. Janice Norrie  . Depression   . Diabetes ([place])    type 2  . Family history of adverse reaction to anesthesia    Daughter stated that she is difficult to wake up.slow to wake up  . Full dentures   . GERD (gastroesophageal reflux disease)   . HOH (hard of hearing)   . Hyperlipidemia   . Hypertension   . Memory loss   . Stroke ([place])   . UTI (lower urinary tract infection)     Past Surgical History:  Procedure Laterality Date  . ABDOMINAL AORTOGRAM W/LOWER EXTREMITY Bilateral 12/19/2019   Procedure: ABDOMINAL AORTOGRAM W/LOWER EXTREMITY;  Surgeon: Henry Sandy, MD;  Location: [facility];  Service: Cardiovascular;  Laterality: Bilateral;  . AMPUTATION Right 12/29/2019   Procedure: AMPUTATION ABOVE KNEE;  Surgeon: Henry Sandy, MD;  Location: [facility];  Service:  Vascular;  Laterality: Right;  . APPENDECTOMY    . CARPAL TUNNEL RELEASE Right   . CATARACT EXTRACTION Bilateral   . CHOLECYSTECTOMY    . COLONOSCOPY    . EP IMPLANTABLE DEVICE N/A 05/28/2015   Procedure: Loop Recorder Insertion;  Surgeon: Henry Klein, MD;  Location: [facility];  Service: Cardiovascular;  Laterality: N/A;  . HERNIA REPAIR     x4  . MULTIPLE TOOTH EXTRACTIONS    . PERIPHERAL VASCULAR BALLOON ANGIOPLASTY Right 12/19/2019   Procedure: PERIPHERAL VASCULAR BALLOON ANGIOPLASTY;  Surgeon: Henry Sandy, MD;  Location: [facility];  Service: Cardiovascular;  Laterality: Right;  Anterior tibial artery  . TEE WITHOUT CARDIOVERSION N/A 05/28/2015   Procedure: TRANSESOPHAGEAL ECHOCARDIOGRAM (TEE);  Surgeon: Henry Klein, MD;  Location: [facility] ENDOS[facility];  Service: Cardiovascular;  Laterality: N/A;  . TONSILLECTOMY       Inpatient Medications: Scheduled Meds: . amLODipine  10 mg Oral Daily  . aspirin EC  81 mg Oral Daily  . docusate sodium  100 mg Oral Daily  . furosemide  20 mg Oral Daily  . heparin  5,000 Units Subcutaneous Q8H  . losartan  100 mg Oral Daily   And  . hydrochlorothiazide  25 mg Oral Daily  . insulin aspart  0-15 Units Subcutaneous Q6H  . pantoprazole  40 mg Oral Daily   Continuous Infusions: . magnesium sulfate bolus IVPB     PRN Meds: acetaminophen **OR** acetaminophen, alum & mag hydroxide-simeth, guaiFENesin-dextromethorphan, hydrALAZINE, labetalol, magnesium sulfate bolus  IVPB, morphine injection, ondansetron, oxyCODONE-acetaminophen, phenol, polyethylene glycol, potassium chloride  Allergies:   No Known Allergies  Social History:   Social History   Socioeconomic History  . Marital status: Married    Spouse name: Not on file  . Number of children: 5  . Years of education: 10  . Highest education level: Not on file  Occupational History  . Occupation: Retired  Tobacco Use  . Smoking status: Former Smoker    Packs/day:  0.50    Years: 5.00    Pack years: 2.50    Types: Cigarettes    Quit date: 12/22/1953    Years since quitting: 66.0  . Smokeless tobacco: Never Used  Substance and Sexual Activity  . Alcohol use: Not Currently    Alcohol/week: 0.0 standard drinks  . Drug use: Yes    Types: Oxycodone  . Sexual activity: Not on file  Other Topics Concern  . Not on file  Social History Narrative   Lives at home with his wife.   Right-handed.   2 cups caffeine per day.   Social Determinants of Health   Financial Resource Strain:   . Difficulty of Paying Living Expenses: Not on file  Food Insecurity:   . Worried About Charity fundraiser in the Last Year: Not on file  . Ran Out of Food in the Last Year: Not on file  Transportation Needs:   . Lack of Transportation (Medical): Not on file  . Lack of Transportation (Non-Medical): Not on file  Physical Activity:   . Days of Exercise per Week: Not on file  . Minutes of Exercise per Session: Not on file  Stress:   . Feeling of Stress : Not on file  Social Connections:   . Frequency of Communication with Friends and Family: Not on file  . Frequency of Social Gatherings with Friends and Family: Not on file  . Attends Religious Services: Not on file  . Active Member of Clubs or Organizations: Not on file  . Attends Archivist Meetings: Not on file  . Marital Status: Not on file  Intimate Partner Violence:   . Fear of Current or Ex-Partner: Not on file  . Emotionally Abused: Not on file  . Physically Abused: Not on file  . Sexually Abused: Not on file    Family History:    Family History  Problem Relation Age of Onset  . Hypertension Mother   . Hypertension Father      ROS:  Please see the history of present illness.  Patient somnolent.  Denies dyspnea or chest pain. All other ROS reviewed and negative.     Physical Exam/Data:   Vitals:   12/31/19 0322 12/31/19 0323 12/31/19 0538 12/31/19 0834  BP:  (!) 158/80 (!) 146/81 (!)  108/56  Pulse:  (!) 107 (!) 44 89  Resp:  (!) 26 (!) 30 20  Temp: 98.6 F (37 C) 98.6 F (37 C)  98.7 F (37.1 C)  TempSrc: Oral Oral  Oral  SpO2:  94% (!) 89% 95%  Weight:      Height:        Intake/Output Summary (Last 24 hours) at 12/31/2019 0910 Last data filed at 12/31/2019 0643 Gross per 24 hour  Intake 340 ml  Output 2000 ml  Net -1660 ml   Last 3 Weights 12/29/2019 12/28/2019 12/19/2019  Weight (lbs) 200 lb 200 lb 200 lb  Weight (kg) 90.719 kg 90.719 kg 90.719 kg     Body  mass index is 29.53 kg/m.  General:  Well nourished, well developed, elderly on facemask HEENT: normal Neck: supple Vascular: No carotid bruits Cardiac:  irregular Lungs: Diminished BS bases Abd: soft, nontender, no hepatomegaly  Ext: no edema; s/p right AKA Skin: warm and dry  Neuro: No gross focal findings   EKG:  The EKG was personally reviewed and demonstrates: Atrial fibrillation with right bundle branch block Telemetry:  Telemetry was personally reviewed and demonstrates: Atrial fibrillation  Laboratory Data:  Chemistry Recent Labs  Lab 12/29/19 1120 12/30/19 0243 12/31/19 0139  NA 139 138 138  K 5.7* 5.2* 4.7  CL 105 102 102  CO2 22 22 24   GLUCOSE 208* 211* 218*  BUN 48* 45* 48*  CREATININE 2.02* 1.76* 1.96*  CALCIUM 9.0 8.5* 8.9  GFRNONAA 28* 33* 29*  GFRAA 33* 39* 34*  ANIONGAP 12 14 12     Recent Labs  Lab 12/29/19 1120  PROT 6.6  ALBUMIN 2.7*  AST 74*  ALT 65*  ALKPHOS 81  BILITOT 2.3*   Hematology Recent Labs  Lab 12/29/19 1120 12/30/19 0243 12/31/19 0139  WBC 15.2* 12.5* 11.1*  RBC 3.68* 3.55* 3.98*  HGB 11.0* 10.6* 11.5*  HCT 34.5* 33.4* 36.3*  MCV 93.8 94.1 91.2  MCH 29.9 29.9 28.9  MCHC 31.9 31.7 31.7  RDW 13.8 14.0 13.7  PLT 257 343 384    Radiology/Studies:  DG Chest Port 1V same Day  Result Date: 12/30/2019 CLINICAL DATA:  Oxygen desaturation. EXAM: PORTABLE CHEST 1 VIEW COMPARISON:  08/09/2019. FINDINGS: Cardiomegaly. Diffuse bilateral  interstitial prominence and small right pleural effusion. Findings suggest CHF. Pneumonitis could also present this fashion. No pneumothorax. IMPRESSION: Cardiomegaly. Diffuse bilateral interstitial prominence and small right pleural effusions. Findings suggest CHF. Pneumonitis could also present this fashion. Electronically Signed   By: Marcello Moores  Register   On: 12/30/2019 08:06    Assessment and Plan:   1. Postoperative atrial fibrillation-patient is in atrial fibrillation and has developed pulmonary edema.  I will begin IV amiodarone hopefully to restore sinus rhythm. CHADSvasc 8.  Will need to initiate anticoagulation prior to discharge.  Check echocardiogram. 2. Acute diastolic congestive heart failure-chest x-ray shows pulmonary edema and O2 requirements are increasing.  Will begin Lasix 80 mg IV twice daily.  Follow renal function closely. 3. Acute on chronic stage III kidney disease-follow renal function closely with diuresis. 4. Hypertension-we will hold antihypertensives for now.  He is receiving metoprolol and IV Lasix.  Will add back if needed. 5. Status post AKA-Per vascular surgery. 6. Respiratory insufficiency-primary care has ordered VQ scan.  For questions or updates, please contact [place] Please consult [email or web address] for contact info under     Signed, Kirk Ruths, MD  12/31/2019 9:10 AM

## 2019-12-31 NOTE — Progress Notes (Signed)
Came to room for ABG.  RN increased flow on V-mask to 15 lpm, fio2 dialed at 40%.    Called lab at [phone] to notify ABG being tubed, spoke w/ Estill Bamberg.

## 2020-01-01 DIAGNOSIS — I480 Paroxysmal atrial fibrillation: Secondary | ICD-10-CM

## 2020-01-01 DIAGNOSIS — I5023 Acute on chronic systolic (congestive) heart failure: Secondary | ICD-10-CM

## 2020-01-01 DIAGNOSIS — J9621 Acute and chronic respiratory failure with hypoxia: Secondary | ICD-10-CM

## 2020-01-01 LAB — GLUCOSE, CAPILLARY
Glucose-Capillary: 253 mg/dL — ABNORMAL HIGH (ref 70–99)
Glucose-Capillary: 218 mg/dL — ABNORMAL HIGH (ref 70–99)
Glucose-Capillary: 273 mg/dL — ABNORMAL HIGH (ref 70–99)
Glucose-Capillary: 197 mg/dL — ABNORMAL HIGH (ref 70–99)
Glucose-Capillary: 242 mg/dL — ABNORMAL HIGH (ref 70–99)

## 2020-01-01 LAB — CBC
HCT: 35.4 % — ABNORMAL LOW (ref 39.0–52.0)
Hemoglobin: 11.4 g/dL — ABNORMAL LOW (ref 13.0–17.0)
MCH: 29.2 pg (ref 26.0–34.0)
MCHC: 32.2 g/dL (ref 30.0–36.0)
MCV: 90.5 fL (ref 80.0–100.0)
Platelets: 373 10*3/uL (ref 150–400)
RBC: 3.91 MIL/uL — ABNORMAL LOW (ref 4.22–5.81)
RDW: 14.1 % (ref 11.5–15.5)
WBC: 11.4 10*3/uL — ABNORMAL HIGH (ref 4.0–10.5)
nRBC: 0 % (ref 0.0–0.2)

## 2020-01-01 LAB — BASIC METABOLIC PANEL
Anion gap: 14 (ref 5–15)
BUN: 55 mg/dL — ABNORMAL HIGH (ref 8–23)
CO2: 23 mmol/L (ref 22–32)
Calcium: 8.5 mg/dL — ABNORMAL LOW (ref 8.9–10.3)
Chloride: 99 mmol/L (ref 98–111)
Creatinine, Ser: 1.88 mg/dL — ABNORMAL HIGH (ref 0.61–1.24)
GFR calc Af Amer: 36 mL/min — ABNORMAL LOW (ref >60)
GFR calc non Af Amer: 31 mL/min — ABNORMAL LOW (ref >60)
Glucose, Bld: 269 mg/dL — ABNORMAL HIGH (ref 70–99)
Potassium: 4.2 mmol/L (ref 3.5–5.1)
Sodium: 136 mmol/L (ref 135–145)

## 2020-01-01 MED ORDER — FUROSEMIDE 10 MG/ML IJ SOLN: 40.0000 mg | Freq: Every day | INTRAMUSCULAR | Status: DC

## 2020-01-01 MED ORDER — APIXABAN 2.5 MG PO TABS
2.5000 mg | ORAL_TABLET | Freq: Two times a day (BID) | ORAL | Status: DC
  Administered 2020-01-01 – 2020-01-04 (×7): 2.5 mg via ORAL
  Filled 2020-01-01 (×7): qty 1

## 2020-01-01 MED ORDER — CARVEDILOL 3.125 MG PO TABS
3.1250 mg | ORAL_TABLET | Freq: Two times a day (BID) | ORAL | Status: DC
  Administered 2020-01-01 – 2020-01-04 (×6): 3.125 mg via ORAL
  Filled 2020-01-01 (×6): qty 1

## 2020-01-01 NOTE — Plan of Care (Signed)
Poc progressing.  

## 2020-01-01 NOTE — Progress Notes (Signed)
Progress Note  Patient Name: Henry Wiggins Date of Encounter: 01/01/2020  Primary Cardiologist: Dr Sallyanne Kuster  Subjective   Denies CP or dyspnea; more alert  Inpatient Medications    Scheduled Meds:  aspirin EC  81 mg Oral Daily   docusate sodium  100 mg Oral Daily   furosemide  80 mg Intravenous BID   heparin  5,000 Units Subcutaneous Q8H   insulin aspart  0-15 Units Subcutaneous Q6H   pantoprazole  40 mg Oral Daily   Continuous Infusions:  amiodarone 30 mg/hr (12/31/19 2023)   magnesium sulfate bolus IVPB     PRN Meds: acetaminophen **OR** acetaminophen, alum & mag hydroxide-simeth, guaiFENesin-dextromethorphan, hydrALAZINE, labetalol, magnesium sulfate bolus IVPB, morphine injection, ondansetron, oxyCODONE-acetaminophen, phenol, polyethylene glycol, potassium chloride   Vital Signs    Vitals:   12/31/19 2000 01/01/20 0232 01/01/20 0400 01/01/20 0822  BP: 124/68  123/74 124/71  Pulse: 93  (!) 102 82  Resp: (!) 28 (!) 22 (!) 22 17  Temp: 99.5 F (37.5 C)  98.9 F (37.2 C) 98.4 F (36.9 C)  TempSrc: Axillary  Oral Oral  SpO2: 91% 96% 96% 97%  Weight:      Height:        Intake/Output Summary (Last 24 hours) at 01/01/2020 0842 Last data filed at 01/01/2020 0400 Gross per 24 hour  Intake 371.98 ml  Output 1775 ml  Net -1403.02 ml   Last 3 Weights 12/29/2019 12/28/2019 12/19/2019  Weight (lbs) 200 lb 200 lb 200 lb  Weight (kg) 90.719 kg 90.719 kg 90.719 kg      Telemetry    Sinus- Personally Reviewed  Physical Exam   GEN: No acute distress.  Elderly Neck: supple Cardiac: RRR Respiratory: Mild diffuse rhonchi GI: Soft, NT ND MS: No edema Neuro:  Nonfocal  Psych: Normal affect   Labs    High Sensitivity Troponin:   Recent Labs  Lab 12/31/19 0815 12/31/19 1129 12/31/19 1420 12/31/19 1658  TROPONINIHS 293* 552* 596* 650*      Chemistry Recent Labs  Lab 12/29/19 1120 12/31/19 0139 12/31/19 0815 01/01/20 0114  NA 139 138 136 136    K 5.7* 4.7 5.2* 4.2  CL 105 102 102 99  CO2 22 24 22 23   GLUCOSE 208* 218* 261* 269*  BUN 48* 48* 55* 55*  CREATININE 2.02* 1.96* 2.10* 1.88*  CALCIUM 9.0 8.9 8.7* 8.5*  PROT 6.6  --  6.4*  --   ALBUMIN 2.7*  --  2.5*  --   AST 74*  --  32  --   ALT 65*  --  17  --   ALKPHOS 81  --  85  --   BILITOT 2.3*  --  1.2  --   GFRNONAA 28* 29* 27* 31*  GFRAA 33* 34* 31* 36*  ANIONGAP 12 12 12 14      Hematology Recent Labs  Lab 12/30/19 0243 12/31/19 0139 01/01/20 0114  WBC 12.5* 11.1* 11.4*  RBC 3.55* 3.98* 3.91*  HGB 10.6* 11.5* 11.4*  HCT 33.4* 36.3* 35.4*  MCV 94.1 91.2 90.5  MCH 29.9 28.9 29.2  MCHC 31.7 31.7 32.2  RDW 14.0 13.7 14.1  PLT 343 384 373    BNP Recent Labs  Lab 12/31/19 0139  BNP 1,620.0*     Radiology    NM Pulmonary Perfusion  Result Date: 12/31/2019 CLINICAL DATA:  Decreased oxygen saturation. Recent lower extremity amputation EXAM: NUCLEAR MEDICINE PERFUSION LUNG SCAN TECHNIQUE: Perfusion images were obtained in multiple  projections after intravenous injection of radiopharmaceutical. Ventilation scans intentionally deferred if perfusion scan and chest x-ray adequate for interpretation during COVID 19 epidemic. Views: Anterior, posterior, left lateral, right lateral, RPO, LPO, RAO, LAO RADIOPHARMACEUTICALS:  1.59 mCi Tc-43m MAA IV COMPARISON:  Chest radiograph December 31, 2019 FINDINGS: Radiotracer uptake is homogeneous and symmetric bilaterally. No appreciable perfusion defects. IMPRESSION: No appreciable perfusion defects. Very low probability of pulmonary embolus. Electronically Signed   By: Lowella Grip III M.D.   On: 12/31/2019 10:46   DG CHEST PORT 1 VIEW  Result Date: 12/31/2019 CLINICAL DATA:  Abnormal respiration. EXAM: PORTABLE CHEST 1 VIEW COMPARISON:  12/30/2019 FINDINGS: The patient is rotated to the right with grossly unchanged cardiomediastinal silhouette. A loop recorder remains in place. Mild diffuse interstitial opacity throughout  both lungs and bibasilar airspace opacities have not significantly changed. No sizable pleural effusion or pneumothorax is identified. IMPRESSION: Unchanged appearance bilateral interstitial and airspace opacities suggesting edema. Electronically Signed   By: Logan Bores M.D.   On: 12/31/2019 09:20   ECHOCARDIOGRAM COMPLETE  Result Date: 12/31/2019   ECHOCARDIOGRAM REPORT   Patient Name:   Henry Wiggins Date of Exam: 12/31/2019 Medical Rec #:  [id number]       Height:       69.0 in Accession #:    [id number]      Weight:       200.0 lb Date of Birth:  06/13/1927       BSA:          2.07 m Patient Age:    26 years        BP:           108/56 mmHg Patient Gender: M               HR:           85 bpm. Exam Location:  Inpatient Procedure: 2D Echo, Cardiac Doppler and Color Doppler STAT ECHO Indications:    I50.33 Acute on chronic diastolic (congestive) heart failure  History:        Patient has prior history of Echocardiogram examinations, most                 recent 05/28/2015. Stroke; Risk Factors:Hypertension, Diabetes,                 Dyslipidemia and GERD. Cancer.  Sonographer:    Tiffany Dance Referring Phys: [id number] RONDELL A SMITH IMPRESSIONS  1. Left ventricular ejection fraction, by visual estimation, is 30 to 35%. The left ventricle has moderate to severely decreased function. There is mildly increased left ventricular hypertrophy.  2. Left ventricular diastolic parameters are consistent with Grade II diastolic dysfunction (pseudonormalization).  3. The left ventricle demonstrates regional wall motion abnormalities.  4. Global right ventricle has normal systolic function.The right ventricular size is normal.  5. Left atrial size was mildly dilated.  6. Right atrial size was normal.  7. The mitral valve is normal in structure. Mild mitral valve regurgitation. No evidence of mitral stenosis.  8. The tricuspid valve is normal in structure.  9. The aortic valve is tricuspid. Aortic valve regurgitation is  trivial. Mild aortic valve sclerosis without stenosis. 10. The pulmonic valve was normal in structure. Pulmonic valve regurgitation is not visualized. 11. The inferior vena cava is dilated in size with >50% respiratory variability, suggesting right atrial pressure of 8 mmHg. 12. Severe hypokinesis of the distal septum and apex; overall moderate to severe LV dysfunction; grade 2  diastolic dsyfunction; mild LVH; mild LAE; mild MR. FINDINGS  Left Ventricle: Left ventricular ejection fraction, by visual estimation, is 30 to 35%. The left ventricle has moderate to severely decreased function. The left ventricle demonstrates regional wall motion abnormalities. There is mildly increased left ventricular hypertrophy. Left ventricular diastolic parameters are consistent with Grade II diastolic dysfunction (pseudonormalization). Normal left atrial pressure. Right Ventricle: The right ventricular size is normal.Global RV systolic function is has normal systolic function. The tricuspid regurgitant velocity is 2.33 m/s, and with an assumed right atrial pressure of 8 mmHg, the estimated right ventricular systolic pressure is normal at 29.7 mmHg. Left Atrium: Left atrial size was mildly dilated. Right Atrium: Right atrial size was normal in size Pericardium: There is no evidence of pericardial effusion. Mitral Valve: The mitral valve is normal in structure. Mild mitral valve regurgitation. No evidence of mitral valve stenosis by observation. Tricuspid Valve: The tricuspid valve is normal in structure. Tricuspid valve regurgitation is mild. Aortic Valve: The aortic valve is tricuspid. Aortic valve regurgitation is trivial. Aortic regurgitation PHT measures 466 msec. Mild aortic valve sclerosis is present, with no evidence of aortic valve stenosis. Pulmonic Valve: The pulmonic valve was normal in structure. Pulmonic valve regurgitation is not visualized. Pulmonic regurgitation is not visualized. Aorta: The aortic root is normal in  size and structure. Venous: The inferior vena cava is dilated in size with greater than 50% respiratory variability, suggesting right atrial pressure of 8 mmHg. IAS/Shunts: No atrial level shunt detected by color flow Doppler. Additional Comments: Severe hypokinesis of the distal septum and apex; overall moderate to severe LV dysfunction; grade 2 diastolic dsyfunction; mild LVH; mild LAE; mild MR.  LEFT VENTRICLE PLAX 2D LVIDd:         5.16 cm LVIDs:         4.71 cm LV PW:         1.21 cm LV IVS:        1.18 cm LVOT diam:     2.20 cm LV SV:         24 ml LV SV Index:   11.46 LVOT Area:     3.80 cm  RIGHT VENTRICLE             IVC RV Basal diam:  3.46 cm     IVC diam: 2.36 cm RV Mid diam:    1.71 cm RV S prime:     11.90 cm/s TAPSE (M-mode): 2.0 cm LEFT ATRIUM             Index       RIGHT ATRIUM           Index LA diam:        5.10 cm 2.47 cm/m  RA Area:     20.00 cm LA Vol (A2C):   83.0 ml 40.17 ml/m RA Volume:   59.70 ml  28.90 ml/m LA Vol (A4C):   67.7 ml 32.77 ml/m LA Biplane Vol: 76.5 ml 37.03 ml/m  AORTIC VALVE LVOT Vmax:   62.05 cm/s LVOT Vmean:  39.250 cm/s LVOT VTI:    0.096 m AI PHT:      466 msec  AORTA Ao Root diam: 3.70 cm Ao Asc diam:  3.50 cm MITRAL VALVE                        TRICUSPID VALVE MV Area (PHT): 4.39 cm             TR  Peak grad:   21.7 mmHg MV PHT:        50.17 msec           TR Vmax:        233.00 cm/s MV Decel Time: 173 msec MV E velocity: 98.50 cm/s 103 cm/s  SHUNTS MV A velocity: 59.20 cm/s 70.3 cm/s Systemic VTI:  0.10 m MV E/A ratio:  1.66       1.5       Systemic Diam: 2.20 cm  Kirk Ruths MD Electronically signed by Kirk Ruths MD Signature Date/Time: 12/31/2019/1:23:45 PM    Final     Patient Profile     Henry Wiggins is a 86 y.o. male with a hx of diabetes mellitus, hypertension, hyperlipidemia, prior CVA, peripheral vascular disease and now status post AKA who is being seen today for the evaluation of postoperative atrial fibrillation.  Echocardiogram shows  ejection fraction 30 to 35% with hypokinesis of the distal septum and apex.  There is grade 2 diastolic dysfunction, mild left atrial enlargement and mild mitral regurgitation.  Assessment & Plan    1. Postoperative atrial fibrillation-patient converted to sinus rhythm yesterday.  Continue IV amiodarone today and transition to oral tomorrow.  Will begin apixaban 2.5 mg twice daily. 2. Acute diastolic congestive heart failure-previous chest x-ray showed pulmonary edema; possibly related to atrial fibrillation.  BUN/creatinine higher today.  Will decrease Lasix to 40 mg IV daily and follow.  Note oxygen requirements have decreased. 3. Acute on chronic stage III kidney disease-decrease Lasix as outlined above.  Follow BUN and creatinine. 4. Cardiomyopathy-echocardiogram shows reduced LV function.  Troponin minimally elevated but in the setting of atrial fibrillation and renal insufficiency.  I do not think he would be a good candidate for invasive cardiac evaluation.  I will add low-dose carvedilol and advance as tolerated.  Add hydralazine/nitrates later as blood pressure allows.  Avoid ARB or Entresto due to renal insufficiency. 5. Status post AKA-Per vascular surgery. 6. Respiratory insufficiency-oxygen requirements decreased today.  VQ scan negative.  Likely from pulmonary edema related to atrial fibrillation.  For questions or updates, please contact [place] Please consult [email or web address] for contact info under        Signed, Kirk Ruths, MD  01/01/2020, 8:42 AM

## 2020-01-01 NOTE — Progress Notes (Signed)
PROGRESS NOTE-consult follow-up    Henry Wiggins  YBO:[id number] DOB: 1927/12/04 DOA: 12/29/2019 PCP: Antony Contras, MD   Brief Narrative:  Per consulting physician: Braydin Aloi Patin is an 86 y.o. male with past medical history significant for hypertension, hyperlipidemia, CVA, PVD, and DM type II; who presented for progressive ischemia requiring above-knee amputation on 12/29/2019 by Dr. Donzetta Matters.  Since the procedure the patient had progressively worsening shortness of breath.  He had been initially given IV Lasix after chest x-ray revealed interstitial opacities concerning for edema.  This morning patient however developed worsening respiratory distress and was placed on a Venturi mask.  Patient does not provide give any additional history.  TRH consult to help assist in evaluation of patient's respiratory failure.    Assessment & Plan:   Active Problems:   PAD (peripheral artery disease) (HCC)   S/P AKA (above knee amputation) ([place])   Postop day 2 from right AKA -Per vascular surgery  Acute respiratory failure with hypoxia secondary to acute diastolic congestive heart failure:  -As previously noted, patient noted to have progressively worsening shortness of breath after his recent amputation.  Chest x-ray showing cardiomegaly with signs of pulmonary edema.  ABG was noted to be significant for pH 7.485, PCO2 33, PO2 55.   -Cardiology following -Continue with diuresis, strict I's and O's, daily weights  Postoperative atrial fibrillation: Patient was seen to have into atrial fibrillation with heart rates into the 150s.  Given IV metoprolol with some mild improvement.  Cardiology evaluated to be started on IV amiodarone per cardiology. CHA2DS2-VASc score = 8 for which he was recommended to be initiated on anticoagulation.  Daughter denied any previous history of bleeding. -Per cardiology  Elevated troponin: Acute.  Troponin is noted to be elevated trending from 293->500's. -Continue to  monitor -As cardiology is following will defer any intervention to cardiology eval  Acute kidney injury superimposed on chronic kidney disease stage III:  -Improved, creatinine noted to be 1.88  Essential hypertension: Blood pressure medications currently on hold per cardiology  Thank you for this consultation.  [facility] hospitalist team will follow the patient with you.  DVT prophylaxis: ID:[id number]  Code Status: Full code    Code Status Orders  (From admission, onward)         Start     Ordered   12/29/19 1603  Full code  Continuous     12/29/19 1603        Code Status History    Date Active Date Inactive Code Status Order ID Comments User Context   12/19/2019 1628 12/20/2019 0122 Full Code [id number]  Waynetta Sandy, MD Inpatient   05/27/2015 0054 05/28/2015 2031 Full Code [id number]  Jani Gravel, MD Inpatient   Advance Care Planning Activity    Advance Directive Documentation     Most Recent Value  Type of Advance Directive  Healthcare Power of Attorney  Pre-existing out of facility DNR order (yellow form or pink MOST form)  --  "MOST" Form in Place?  --     Family Communication: Defer to primary team Disposition Plan: Per primary team Consults called: None Admission status: Inpatient   Consultants:   TRH, cardiology,  Procedures:  NM Pulmonary Perfusion  Result Date: 12/31/2019 CLINICAL DATA:  Decreased oxygen saturation. Recent lower extremity amputation EXAM: NUCLEAR MEDICINE PERFUSION LUNG SCAN TECHNIQUE: Perfusion images were obtained in multiple projections after intravenous injection of radiopharmaceutical. Ventilation scans intentionally deferred if perfusion scan and chest x-ray adequate for interpretation during  COVID 19 epidemic. Views: Anterior, posterior, left lateral, right lateral, RPO, LPO, RAO, LAO RADIOPHARMACEUTICALS:  1.59 mCi Tc-3m MAA IV COMPARISON:  Chest radiograph December 31, 2019 FINDINGS: Radiotracer uptake is homogeneous and  symmetric bilaterally. No appreciable perfusion defects. IMPRESSION: No appreciable perfusion defects. Very low probability of pulmonary embolus. Electronically Signed   By: Lowella Grip III M.D.   On: 12/31/2019 10:46   PERIPHERAL VASCULAR CATHETERIZATION  Result Date: 12/19/2019 Patient name: Henry Wiggins MRN: [id number] DOB: [birth year] Sex: male 12/19/2019 Pre-operative Diagnosis: Critical bilateral lower extremity ischemia with toe ulceration Post-operative diagnosis:  Same Surgeon:  Erlene Quan C. Donzetta Matters, MD Procedure Performed: 1.  Ultrasound-guided cannulation left common femoral artery 2.  CO2 aortogram with bilateral lower extremity runoff 3.  Selection of right anterior tibial artery and contrasted angiography below the knee Indications: 86 year old male with bilateral toe ulceration.  He has no previous vascular invention.  He does have previous history of elevated creatinine.  He is indicated for aortogram possible intervention on the right first. Findings: The aorta and iliac segments are calcified heavily tortuous.  Bilateral SFAs are calcified.  Below the knee was not visualized on the left side given CO2.  On the right side the SFA there is a proximal 90% stenosis distal SFA with multiple approximate 50% stenosis throughout.  Below the knee there is an anterior tibial artery that stops in the mid calf.  Peroneal artery is occluded does reconstitute in the mid calf does not appear to run to the foot.  There is no visualized posterior tibial artery.  I attempted to cross the anterior tibial artery but there was no flow distally.  I attempted to cross the peroneal artery but I did have a perforation and it did not appear to have distal reconstitution.  Patient does not have revascularization issues on the right.  He will be scheduled for angiography possible intervention on the left in the near future.  Procedure:  The patient was identified in the holding area and taken to room 8.  The patient was  then placed supine on the table and prepped and draped in the usual sterile fashion.  A time out was called.  Ultrasound was used to evaluate the left common femoral artery.  This was heavily calcified although patent.  There is anesthetized 1% lidocaine cannulated with direct ultrasound visualization with micropuncture needle followed by wire and sheath.  Images saved the permanent record.  A 5 French sheath was placed followed by Omni catheter over Bentson wire to the level of L1.  CO2 aortogram was performed.  Catheter was pulled to the bifurcation and limited bilateral lower extremity angiography was performed.  We then crossed bifurcation to the right common femoral artery perform CO2 angiography.  There appeared to be a tight stenosis of the right SFA.  We are able to place a 6 French sheath into the right common femoral artery over a stiff wire.  Patient was fully heparinized.  We then crossed through the SFA down to below the knee and performed contrasted angiography.  I did take a V 18 wire attempt across to the anterior tibial artery but unfortunately there was no reconstitution distally.  I then attempted to cross the peroneal artery but this also had minimal reconstitution distally.  Patient appears to have minimal flow into the foot does not have any revascularization options.  Catheters and wires were removed and the sheath was retracted in the left external iliac artery and will be pulled in postoperative  holding.  We will schedule patient for left lower extremity angiography in the near future. Contrast: 60cc Brandon C. Donzetta Matters, MD [facility] Office: [phone] Pager: [phone]  DG CHEST PORT 1 VIEW  Result Date: 12/31/2019 CLINICAL DATA:  Abnormal respiration. EXAM: PORTABLE CHEST 1 VIEW COMPARISON:  12/30/2019 FINDINGS: The patient is rotated to the right with grossly unchanged cardiomediastinal silhouette. A loop recorder remains in place. Mild diffuse  interstitial opacity throughout both lungs and bibasilar airspace opacities have not significantly changed. No sizable pleural effusion or pneumothorax is identified. IMPRESSION: Unchanged appearance bilateral interstitial and airspace opacities suggesting edema. Electronically Signed   By: Logan Bores M.D.   On: 12/31/2019 09:20   DG Chest Port 1V same Day  Result Date: 12/30/2019 CLINICAL DATA:  Oxygen desaturation. EXAM: PORTABLE CHEST 1 VIEW COMPARISON:  08/09/2019. FINDINGS: Cardiomegaly. Diffuse bilateral interstitial prominence and small right pleural effusion. Findings suggest CHF. Pneumonitis could also present this fashion. No pneumothorax. IMPRESSION: Cardiomegaly. Diffuse bilateral interstitial prominence and small right pleural effusions. Findings suggest CHF. Pneumonitis could also present this fashion. Electronically Signed   By: Marcello Moores  Register   On: 12/30/2019 08:06   ECHOCARDIOGRAM COMPLETE  Result Date: 12/31/2019   ECHOCARDIOGRAM REPORT   Patient Name:   Henry Wiggins Date of Exam: 12/31/2019 Medical Rec #:  [id number]       Height:       69.0 in Accession #:    [id number]      Weight:       200.0 lb Date of Birth:  [birth year]       BSA:          2.07 m Patient Age:    12 years        BP:           108/56 mmHg Patient Gender: M               HR:           85 bpm. Exam Location:  Inpatient Procedure: 2D Echo, Cardiac Doppler and Color Doppler STAT ECHO Indications:    I50.33 Acute on chronic diastolic (congestive) heart failure  History:        Patient has prior history of Echocardiogram examinations, most                 recent 05/28/2015. Stroke; Risk Factors:Hypertension, Diabetes,                 Dyslipidemia and GERD. Cancer.  Sonographer:    Tiffany Dance Referring Phys: [id number] RONDELL A SMITH IMPRESSIONS  1. Left ventricular ejection fraction, by visual estimation, is 30 to 35%. The left ventricle has moderate to severely decreased function. There is mildly increased left ventricular  hypertrophy.  2. Left ventricular diastolic parameters are consistent with Grade II diastolic dysfunction (pseudonormalization).  3. The left ventricle demonstrates regional wall motion abnormalities.  4. Global right ventricle has normal systolic function.The right ventricular size is normal.  5. Left atrial size was mildly dilated.  6. Right atrial size was normal.  7. The mitral valve is normal in structure. Mild mitral valve regurgitation. No evidence of mitral stenosis.  8. The tricuspid valve is normal in structure.  9. The aortic valve is tricuspid. Aortic valve regurgitation is trivial. Mild aortic valve sclerosis without stenosis. 10. The pulmonic valve was normal in structure. Pulmonic valve regurgitation is not visualized. 11. The inferior vena cava is dilated in size with >50% respiratory variability,  suggesting right atrial pressure of 8 mmHg. 12. Severe hypokinesis of the distal septum and apex; overall moderate to severe LV dysfunction; grade 2 diastolic dsyfunction; mild LVH; mild LAE; mild MR. FINDINGS  Left Ventricle: Left ventricular ejection fraction, by visual estimation, is 30 to 35%. The left ventricle has moderate to severely decreased function. The left ventricle demonstrates regional wall motion abnormalities. There is mildly increased left ventricular hypertrophy. Left ventricular diastolic parameters are consistent with Grade II diastolic dysfunction (pseudonormalization). Normal left atrial pressure. Right Ventricle: The right ventricular size is normal.Global RV systolic function is has normal systolic function. The tricuspid regurgitant velocity is 2.33 m/s, and with an assumed right atrial pressure of 8 mmHg, the estimated right ventricular systolic pressure is normal at 29.7 mmHg. Left Atrium: Left atrial size was mildly dilated. Right Atrium: Right atrial size was normal in size Pericardium: There is no evidence of pericardial effusion. Mitral Valve: The mitral valve is normal in  structure. Mild mitral valve regurgitation. No evidence of mitral valve stenosis by observation. Tricuspid Valve: The tricuspid valve is normal in structure. Tricuspid valve regurgitation is mild. Aortic Valve: The aortic valve is tricuspid. Aortic valve regurgitation is trivial. Aortic regurgitation PHT measures 466 msec. Mild aortic valve sclerosis is present, with no evidence of aortic valve stenosis. Pulmonic Valve: The pulmonic valve was normal in structure. Pulmonic valve regurgitation is not visualized. Pulmonic regurgitation is not visualized. Aorta: The aortic root is normal in size and structure. Venous: The inferior vena cava is dilated in size with greater than 50% respiratory variability, suggesting right atrial pressure of 8 mmHg. IAS/Shunts: No atrial level shunt detected by color flow Doppler. Additional Comments: Severe hypokinesis of the distal septum and apex; overall moderate to severe LV dysfunction; grade 2 diastolic dsyfunction; mild LVH; mild LAE; mild MR.  LEFT VENTRICLE PLAX 2D LVIDd:         5.16 cm LVIDs:         4.71 cm LV PW:         1.21 cm LV IVS:        1.18 cm LVOT diam:     2.20 cm LV SV:         24 ml LV SV Index:   11.46 LVOT Area:     3.80 cm  RIGHT VENTRICLE             IVC RV Basal diam:  3.46 cm     IVC diam: 2.36 cm RV Mid diam:    1.71 cm RV S prime:     11.90 cm/s TAPSE (M-mode): 2.0 cm LEFT ATRIUM             Index       RIGHT ATRIUM           Index LA diam:        5.10 cm 2.47 cm/m  RA Area:     20.00 cm LA Vol (A2C):   83.0 ml 40.17 ml/m RA Volume:   59.70 ml  28.90 ml/m LA Vol (A4C):   67.7 ml 32.77 ml/m LA Biplane Vol: 76.5 ml 37.03 ml/m  AORTIC VALVE LVOT Vmax:   62.05 cm/s LVOT Vmean:  39.250 cm/s LVOT VTI:    0.096 m AI PHT:      466 msec  AORTA Ao Root diam: 3.70 cm Ao Asc diam:  3.50 cm MITRAL VALVE  TRICUSPID VALVE MV Area (PHT): 4.39 cm             TR Peak grad:   21.7 mmHg MV PHT:        50.17 msec           TR Vmax:        233.00  cm/s MV Decel Time: 173 msec MV E velocity: 98.50 cm/s 103 cm/s  SHUNTS MV A velocity: 59.20 cm/s 70.3 cm/s Systemic VTI:  0.10 m MV E/A ratio:  1.66       1.5       Systemic Diam: 2.20 cm  Kirk Ruths MD Electronically signed by Kirk Ruths MD Signature Date/Time: 12/31/2019/1:23:45 PM    Final      Antimicrobials:   None   Subjective: Respiratory status improving decreased from 8 L to 4 L   Objective: Vitals:   01/01/20 0232 01/01/20 0400 01/01/20 0822 01/01/20 1209  BP:  123/74 124/71 132/71  Pulse:  (!) 102 82 86  Resp: (!) 22 (!) 22 17 (!) 24  Temp:  98.9 F (37.2 C) 98.4 F (36.9 C) 98.4 F (36.9 C)  TempSrc:  Oral Oral Oral  SpO2: 96% 96% 97% 94%  Weight:      Height:        Intake/Output Summary (Last 24 hours) at 01/01/2020 1255 Last data filed at 01/01/2020 1037 Gross per 24 hour  Intake 371.98 ml  Output 2075 ml  Net -1703.02 ml   Filed Weights   12/29/19 1058  Weight: 90.7 kg    Examination:  General exam: Appears calm and comfortable  Respiratory system: Mild rales bilateral bases Cardiovascular system: Irregularly irregular rate controlled  gastrointestinal system: Abdomen is nondistended, soft and nontender. No organomegaly or masses felt. Normal bowel sounds heard. Central nervous system: Alert Extremities: Post right AKA no evidence of obvious infection or bleeding Skin: No rashes, lesions or ulcers Psychiatry: Judgement and insight appear normal. Mood & affect flat.     Data Reviewed: I have personally reviewed following labs and imaging studies  CBC: Recent Labs  Lab 12/29/19 1120 12/30/19 0243 12/31/19 0139 01/01/20 0114  WBC 15.2* 12.5* 11.1* 11.4*  HGB 11.0* 10.6* 11.5* 11.4*  HCT 34.5* 33.4* 36.3* 35.4*  MCV 93.8 94.1 91.2 90.5  PLT 257 343 384 458   Basic Metabolic Panel: Recent Labs  Lab 12/29/19 1120 12/30/19 0243 12/31/19 0139 12/31/19 0815 01/01/20 0114  NA 139 138 138 136 136  K 5.7* 5.2* 4.7 5.2* 4.2  CL  105 102 102 102 99  CO2 22 22 24 22 23   GLUCOSE 208* 211* 218* 261* 269*  BUN 48* 45* 48* 55* 55*  CREATININE 2.02* 1.76* 1.96* 2.10* 1.88*  CALCIUM 9.0 8.5* 8.9 8.7* 8.5*  MG  --   --   --  2.3  --    GFR: Estimated Creatinine Clearance: 29.1 mL/min (A) (by C-G formula based on SCr of 1.88 mg/dL (H)). Liver Function Tests: Recent Labs  Lab 12/29/19 1120 12/31/19 0815  AST 74* 32  ALT 65* 17  ALKPHOS 81 85  BILITOT 2.3* 1.2  PROT 6.6 6.4*  ALBUMIN 2.7* 2.5*   No results for input(s): LIPASE, AMYLASE in the last 168 hours. No results for input(s): AMMONIA in the last 168 hours. Coagulation Profile: Recent Labs  Lab 12/29/19 1217  INR 1.2   Cardiac Enzymes: No results for input(s): CKTOTAL, CKMB, CKMBINDEX, TROPONINI in the last 168 hours. BNP (last 3 results) No  results for input(s): PROBNP in the last 8760 hours. HbA1C: Recent Labs    12/29/19 1530  HGBA1C 8.3*   CBG: Recent Labs  Lab 12/31/19 1618 12/31/19 2116 01/01/20 0243 01/01/20 0820 01/01/20 1206  GLUCAP 217* 249* 253* 218* 273*   Lipid Profile: No results for input(s): CHOL, HDL, LDLCALC, TRIG, CHOLHDL, LDLDIRECT in the last 72 hours. Thyroid Function Tests: No results for input(s): TSH, T4TOTAL, FREET4, T3FREE, THYROIDAB in the last 72 hours. Anemia Panel: No results for input(s): VITAMINB12, FOLATE, FERRITIN, TIBC, IRON, RETICCTPCT in the last 72 hours. Sepsis Labs: No results for input(s): PROCALCITON, LATICACIDVEN in the last 168 hours.  Recent Results (from the past 240 hour(s))  SARS CORONAVIRUS 2 (TAT 6-24 HRS) Nasopharyngeal Nasopharyngeal Swab     Status: None   Collection Time: 12/28/19  2:46 PM   Specimen: Nasopharyngeal Swab  Result Value Ref Range Status   SARS Coronavirus 2 NEGATIVE NEGATIVE Final    Comment: (NOTE) SARS-CoV-2 target nucleic acids are NOT DETECTED. The SARS-CoV-2 RNA is generally detectable in upper and lower respiratory specimens during the acute phase of  infection. Negative results do not preclude SARS-CoV-2 infection, do not rule out co-infections with other pathogens, and should not be used as the sole basis for treatment or other patient management decisions. Negative results must be combined with clinical observations, patient history, and epidemiological information. The expected result is Negative. Fact Sheet for Patients: SugarRoll.be Fact Sheet for Healthcare Providers: https://www.woods-mathews.com/ This test is not yet approved or cleared by the [place] FDA and  has been authorized for detection and/or diagnosis of SARS-CoV-2 by FDA under an Emergency Use Authorization (EUA). This EUA will remain  in effect (meaning this test can be used) for the duration of the COVID-19 declaration under Section 56 4(b)(1) of the Act, 21 U.S.C. section 360bbb-3(b)(1), unless the authorization is terminated or revoked sooner. Performed at [facility], [place] [address] [place], [place] [address]          Radiology Studies: NM Pulmonary Perfusion  Result Date: 12/31/2019 CLINICAL DATA:  Decreased oxygen saturation. Recent lower extremity amputation EXAM: NUCLEAR MEDICINE PERFUSION LUNG SCAN TECHNIQUE: Perfusion images were obtained in multiple projections after intravenous injection of radiopharmaceutical. Ventilation scans intentionally deferred if perfusion scan and chest x-ray adequate for interpretation during COVID 19 epidemic. Views: Anterior, posterior, left lateral, right lateral, RPO, LPO, RAO, LAO RADIOPHARMACEUTICALS:  1.59 mCi Tc-19m MAA IV COMPARISON:  Chest radiograph December 31, 2019 FINDINGS: Radiotracer uptake is homogeneous and symmetric bilaterally. No appreciable perfusion defects. IMPRESSION: No appreciable perfusion defects. Very low probability of pulmonary embolus. Electronically Signed   By: Lowella Grip III M.D.   On: 12/31/2019 10:46   DG CHEST PORT 1  VIEW  Result Date: 12/31/2019 CLINICAL DATA:  Abnormal respiration. EXAM: PORTABLE CHEST 1 VIEW COMPARISON:  12/30/2019 FINDINGS: The patient is rotated to the right with grossly unchanged cardiomediastinal silhouette. A loop recorder remains in place. Mild diffuse interstitial opacity throughout both lungs and bibasilar airspace opacities have not significantly changed. No sizable pleural effusion or pneumothorax is identified. IMPRESSION: Unchanged appearance bilateral interstitial and airspace opacities suggesting edema. Electronically Signed   By: Logan Bores M.D.   On: 12/31/2019 09:20   ECHOCARDIOGRAM COMPLETE  Result Date: 12/31/2019   ECHOCARDIOGRAM REPORT   Patient Name:   Henry Wiggins Date of Exam: 12/31/2019 Medical Rec #:  [id number]       Height:       69.0 in Accession #:  [id number]      Weight:       200.0 lb Date of Birth:  [birth year]       BSA:          2.07 m Patient Age:    46 years        BP:           108/56 mmHg Patient Gender: M               HR:           85 bpm. Exam Location:  Inpatient Procedure: 2D Echo, Cardiac Doppler and Color Doppler STAT ECHO Indications:    I50.33 Acute on chronic diastolic (congestive) heart failure  History:        Patient has prior history of Echocardiogram examinations, most                 recent 05/28/2015. Stroke; Risk Factors:Hypertension, Diabetes,                 Dyslipidemia and GERD. Cancer.  Sonographer:    Tiffany Dance Referring Phys: [id number] RONDELL A SMITH IMPRESSIONS  1. Left ventricular ejection fraction, by visual estimation, is 30 to 35%. The left ventricle has moderate to severely decreased function. There is mildly increased left ventricular hypertrophy.  2. Left ventricular diastolic parameters are consistent with Grade II diastolic dysfunction (pseudonormalization).  3. The left ventricle demonstrates regional wall motion abnormalities.  4. Global right ventricle has normal systolic function.The right ventricular size is normal.  5.  Left atrial size was mildly dilated.  6. Right atrial size was normal.  7. The mitral valve is normal in structure. Mild mitral valve regurgitation. No evidence of mitral stenosis.  8. The tricuspid valve is normal in structure.  9. The aortic valve is tricuspid. Aortic valve regurgitation is trivial. Mild aortic valve sclerosis without stenosis. 10. The pulmonic valve was normal in structure. Pulmonic valve regurgitation is not visualized. 11. The inferior vena cava is dilated in size with >50% respiratory variability, suggesting right atrial pressure of 8 mmHg. 12. Severe hypokinesis of the distal septum and apex; overall moderate to severe LV dysfunction; grade 2 diastolic dsyfunction; mild LVH; mild LAE; mild MR. FINDINGS  Left Ventricle: Left ventricular ejection fraction, by visual estimation, is 30 to 35%. The left ventricle has moderate to severely decreased function. The left ventricle demonstrates regional wall motion abnormalities. There is mildly increased left ventricular hypertrophy. Left ventricular diastolic parameters are consistent with Grade II diastolic dysfunction (pseudonormalization). Normal left atrial pressure. Right Ventricle: The right ventricular size is normal.Global RV systolic function is has normal systolic function. The tricuspid regurgitant velocity is 2.33 m/s, and with an assumed right atrial pressure of 8 mmHg, the estimated right ventricular systolic pressure is normal at 29.7 mmHg. Left Atrium: Left atrial size was mildly dilated. Right Atrium: Right atrial size was normal in size Pericardium: There is no evidence of pericardial effusion. Mitral Valve: The mitral valve is normal in structure. Mild mitral valve regurgitation. No evidence of mitral valve stenosis by observation. Tricuspid Valve: The tricuspid valve is normal in structure. Tricuspid valve regurgitation is mild. Aortic Valve: The aortic valve is tricuspid. Aortic valve regurgitation is trivial. Aortic regurgitation  PHT measures 466 msec. Mild aortic valve sclerosis is present, with no evidence of aortic valve stenosis. Pulmonic Valve: The pulmonic valve was normal in structure. Pulmonic valve regurgitation is not visualized. Pulmonic regurgitation is not visualized. Aorta: The aortic root is normal in size  and structure. Venous: The inferior vena cava is dilated in size with greater than 50% respiratory variability, suggesting right atrial pressure of 8 mmHg. IAS/Shunts: No atrial level shunt detected by color flow Doppler. Additional Comments: Severe hypokinesis of the distal septum and apex; overall moderate to severe LV dysfunction; grade 2 diastolic dsyfunction; mild LVH; mild LAE; mild MR.  LEFT VENTRICLE PLAX 2D LVIDd:         5.16 cm LVIDs:         4.71 cm LV PW:         1.21 cm LV IVS:        1.18 cm LVOT diam:     2.20 cm LV SV:         24 ml LV SV Index:   11.46 LVOT Area:     3.80 cm  RIGHT VENTRICLE             IVC RV Basal diam:  3.46 cm     IVC diam: 2.36 cm RV Mid diam:    1.71 cm RV S prime:     11.90 cm/s TAPSE (M-mode): 2.0 cm LEFT ATRIUM             Index       RIGHT ATRIUM           Index LA diam:        5.10 cm 2.47 cm/m  RA Area:     20.00 cm LA Vol (A2C):   83.0 ml 40.17 ml/m RA Volume:   59.70 ml  28.90 ml/m LA Vol (A4C):   67.7 ml 32.77 ml/m LA Biplane Vol: 76.5 ml 37.03 ml/m  AORTIC VALVE LVOT Vmax:   62.05 cm/s LVOT Vmean:  39.250 cm/s LVOT VTI:    0.096 m AI PHT:      466 msec  AORTA Ao Root diam: 3.70 cm Ao Asc diam:  3.50 cm MITRAL VALVE                        TRICUSPID VALVE MV Area (PHT): 4.39 cm             TR Peak grad:   21.7 mmHg MV PHT:        50.17 msec           TR Vmax:        233.00 cm/s MV Decel Time: 173 msec MV E velocity: 98.50 cm/s 103 cm/s  SHUNTS MV A velocity: 59.20 cm/s 70.3 cm/s Systemic VTI:  0.10 m MV E/A ratio:  1.66       1.5       Systemic Diam: 2.20 cm  Kirk Ruths MD Electronically signed by Kirk Ruths MD Signature Date/Time: 12/31/2019/1:23:45 PM    Final          Scheduled Meds:  apixaban  2.5 mg Oral BID   carvedilol  3.125 mg Oral BID WC   docusate sodium  100 mg Oral Daily   [START ON 01/02/2020] furosemide  40 mg Intravenous Daily   insulin aspart  0-15 Units Subcutaneous Q6H   pantoprazole  40 mg Oral Daily   Continuous Infusions:  amiodarone 30 mg/hr (01/01/20 0957)   magnesium sulfate bolus IVPB       LOS: 3 days    Time spent: 35 minutes    Nicolette Bang, MD [facility]  If 7PM-7AM, please contact night-coverage  01/01/2020, 12:55 PM

## 2020-01-01 NOTE — Progress Notes (Addendum)
[facility] and oriented to person and place.   Objective 123/74 (!) 102 98.9 F (37.2 C) (Oral) (!) 22 96%  Intake/Output Summary (Last 24 hours) at 01/01/2020 0748 Last data filed at 01/01/2020 0400 Gross per 24 hour  Intake 371.98 ml  Output 1775 ml  Net -1403.02 ml    Right AKA incision healing without sign of ischemia.  Stump warm to touch. Left open to air Lungs O2 3L [place] SAT's low 90's.  Non labored breathing. Heart Tachycardia 114 rate, denise CP  Assessment/Planning: POD # 2 Right AKA  New A fib on Amacodone Lungs decreased O2  With maintained SAT above 90 Alert to person and place, showing improvement. UO 1775 ml last 24 hours.  Cr 1.88 CKD known Troponin elevated Cardiology following and IM following for assistance in patient management. THX    [name] 01/01/2020 7:48 AM --  Laboratory Lab Results: Recent Labs    12/31/19 0139 01/01/20 0114  WBC 11.1* 11.4*  HGB 11.5* 11.4*  HCT 36.3* 35.4*  PLT 384 373   BMET Recent Labs    12/31/19 0815 01/01/20 0114  NA 136 136  K 5.2* 4.2  CL 102 99  CO2 22 23  GLUCOSE 261* 269*  BUN 55* 55*  CREATININE 2.10* 1.88*  CALCIUM 8.7* 8.5*    COAG Lab Results  Component Value Date   INR 1.2 12/29/2019   INR 1.14 05/26/2015   No results found for: PTT  More alert this am.  AMputation healing nicely.  Appreciate cardiology and Hospitalists assistance  [name]

## 2020-01-02 DIAGNOSIS — I480 Paroxysmal atrial fibrillation: Secondary | ICD-10-CM

## 2020-01-02 DIAGNOSIS — R069 Unspecified abnormalities of breathing: Secondary | ICD-10-CM

## 2020-01-02 DIAGNOSIS — Z89611 Acquired absence of right leg above knee: Secondary | ICD-10-CM

## 2020-01-02 LAB — CBC
HCT: 36.9 % — ABNORMAL LOW (ref 39.0–52.0)
Hemoglobin: 11.8 g/dL — ABNORMAL LOW (ref 13.0–17.0)
MCH: 29 pg (ref 26.0–34.0)
MCHC: 32 g/dL (ref 30.0–36.0)
MCV: 90.7 fL (ref 80.0–100.0)
Platelets: 360 10*3/uL (ref 150–400)
RBC: 4.07 MIL/uL — ABNORMAL LOW (ref 4.22–5.81)
RDW: 13.8 % (ref 11.5–15.5)
WBC: 11.7 10*3/uL — ABNORMAL HIGH (ref 4.0–10.5)
nRBC: 0 % (ref 0.0–0.2)

## 2020-01-02 LAB — BASIC METABOLIC PANEL
Anion gap: 41 — ABNORMAL HIGH (ref 5–15)
Anion gap: 12 (ref 5–15)
Anion gap: 12 (ref 5–15)
BUN: 39 mg/dL — ABNORMAL HIGH (ref 8–23)
BUN: 60 mg/dL — ABNORMAL HIGH (ref 8–23)
BUN: 59 mg/dL — ABNORMAL HIGH (ref 8–23)
CO2: 25 mmol/L (ref 22–32)
CO2: 24 mmol/L (ref 22–32)
CO2: 25 mmol/L (ref 22–32)
Calcium: 7 mg/dL — ABNORMAL LOW (ref 8.9–10.3)
Calcium: 7.9 mg/dL — ABNORMAL LOW (ref 8.9–10.3)
Calcium: 8.3 mg/dL — ABNORMAL LOW (ref 8.9–10.3)
Chloride: 96 mmol/L — ABNORMAL LOW (ref 98–111)
Chloride: 96 mmol/L — ABNORMAL LOW (ref 98–111)
Chloride: 96 mmol/L — ABNORMAL LOW (ref 98–111)
Creatinine, Ser: 1.57 mg/dL — ABNORMAL HIGH (ref 0.61–1.24)
Creatinine, Ser: 1.97 mg/dL — ABNORMAL HIGH (ref 0.61–1.24)
Creatinine, Ser: 1.98 mg/dL — ABNORMAL HIGH (ref 0.61–1.24)
GFR calc Af Amer: 44 mL/min — ABNORMAL LOW (ref >60)
GFR calc Af Amer: 34 mL/min — ABNORMAL LOW (ref >60)
GFR calc Af Amer: 33 mL/min — ABNORMAL LOW (ref >60)
GFR calc non Af Amer: 38 mL/min — ABNORMAL LOW (ref >60)
GFR calc non Af Amer: 29 mL/min — ABNORMAL LOW (ref >60)
GFR calc non Af Amer: 29 mL/min — ABNORMAL LOW (ref >60)
Glucose, Bld: 147 mg/dL — ABNORMAL HIGH (ref 70–99)
Glucose, Bld: 292 mg/dL — ABNORMAL HIGH (ref 70–99)
Glucose, Bld: 270 mg/dL — ABNORMAL HIGH (ref 70–99)
Potassium: 4.7 mmol/L (ref 3.5–5.1)
Potassium: 4.2 mmol/L (ref 3.5–5.1)
Potassium: 4 mmol/L (ref 3.5–5.1)
Sodium: 162 mmol/L (ref 135–145)
Sodium: 132 mmol/L — ABNORMAL LOW (ref 135–145)
Sodium: 133 mmol/L — ABNORMAL LOW (ref 135–145)

## 2020-01-02 LAB — GLUCOSE, CAPILLARY
Glucose-Capillary: 298 mg/dL — ABNORMAL HIGH (ref 70–99)
Glucose-Capillary: 300 mg/dL — ABNORMAL HIGH (ref 70–99)
Glucose-Capillary: 244 mg/dL — ABNORMAL HIGH (ref 70–99)
Glucose-Capillary: 222 mg/dL — ABNORMAL HIGH (ref 70–99)

## 2020-01-02 MED ORDER — DEXTROSE 5 % IV BOLUS
500.0000 mL | Freq: Once | INTRAVENOUS | Status: AC
  Administered 2020-01-02: 06:00:00 500 mL via INTRAVENOUS

## 2020-01-02 MED ORDER — SENNA 8.6 MG PO TABS
1.0000 | ORAL_TABLET | Freq: Every day | ORAL | Status: DC
  Administered 2020-01-02: 09:00:00 8.6 mg via ORAL
  Filled 2020-01-02 (×2): qty 1

## 2020-01-02 MED ORDER — BOOST / RESOURCE BREEZE PO LIQD CUSTOM
1.0000 | Freq: Two times a day (BID) | ORAL | Status: DC
  Administered 2020-01-02 – 2020-01-03 (×2): 1 via ORAL

## 2020-01-02 MED ORDER — FUROSEMIDE 10 MG/ML IJ SOLN
40.0000 mg | Freq: Every day | INTRAMUSCULAR | Status: DC
  Administered 2020-01-02 – 2020-01-03 (×2): 40 mg via INTRAVENOUS
  Filled 2020-01-02 (×2): qty 4

## 2020-01-02 MED ORDER — INSULIN GLARGINE 100 UNIT/ML ~~LOC~~ SOLN
6.0000 [IU] | Freq: Every day | SUBCUTANEOUS | Status: DC
  Administered 2020-01-02: 12:00:00 6 [IU] via SUBCUTANEOUS
  Filled 2020-01-02 (×2): qty 0.06

## 2020-01-02 MED ORDER — POLYETHYLENE GLYCOL 3350 17 G PO PACK
17.0000 g | PACK | Freq: Every day | ORAL | Status: DC
  Administered 2020-01-02: 17 g via ORAL
  Filled 2020-01-02 (×2): qty 1

## 2020-01-02 MED ORDER — AMIODARONE HCL 200 MG PO TABS
400.0000 mg | ORAL_TABLET | Freq: Two times a day (BID) | ORAL | Status: DC
  Administered 2020-01-02 – 2020-01-04 (×4): 400 mg via ORAL
  Filled 2020-01-02 (×4): qty 2

## 2020-01-02 MED ORDER — AMIODARONE HCL 200 MG PO TABS: 200.0000 mg | ORAL_TABLET | Freq: Every day | ORAL | Status: DC

## 2020-01-02 NOTE — Progress Notes (Addendum)
PROGRESS NOTE    Henry Wiggins  ZOX:[id number] DOB: December 07, 1927 DOA: 12/29/2019 PCP: Antony Contras, MD   Brief Narrative: Per consulting physician: [name] an 86 y.o.malewith past medical history significant for hypertension, hyperlipidemia, CVA, PVD, and DM type II; who presented for progressive ischemia requiring above-knee amputation on 12/29/2019 by Dr. Donzetta Matters. Since the procedure the patient had progressively worsening shortness of breath. He had been initially given IV Lasix after chest x-ray revealed interstitial opacities concerning for edema. This morning patient however developed worsening respiratory distress and was placed on a Venturi mask. Patient does not provide give any additional history. TRH consult to help assist in evaluation of patient's respiratory failure.  Patient oxygen requirement continued to improve amiodarone drip.  Lexis.  He is today at 4 L of oxygen.  Assessment & Plan:   Active Problems:   PAD (peripheral artery disease) (HCC)   S/P AKA (above knee amputation) ([place])   1-postoperative day 3 from right AKA Per vascular. Might need CIR.  CIR consulted.  2-acute respiratory failure with hypoxemia secondary to acute diastolic heart failure exacerbation: Patient developed progressive shortness of breath chest x-ray show pulmonary edema.  ABG with a PO2 of 55. Continue with IV Lasix. Cardiology following.  3-postoperative A. Fib: Started on IV amiodarone drip On Eliquis Plan to transition to oral amiodarone  Elevation  of troponin: Demand ischemia secondary to diastolic heart failure Cardiology  Following.  AKI on chronic kidney disease a stage III Creatinine peaked to 2.1.. Monitor on IV Lasix. Prior creatinine 2 weeks ago 1.7--1.5  Hypertension: Hypernatremia: Likely lab error  DM ; with hyperglycemia; start lantus. Continue with SSI Will likely need to hold metformin at discharge due to elevated cr.   Estimated body mass  index is 29.53 kg/m as calculated from the following:   Height as of this encounter: 5\' 9"  (1.753 m).   Weight as of this encounter: 90.7 kg.   DVT prophylaxis: Eliquis Code Status: Full code Family Communication: Discussed with patient Disposition Plan: CIR consulted, still on IV Lasix transition to oral soon     Subjective: He is alert, hard of hearing.  He report he feels better, dyspnea has improved.  Objective: Vitals:   01/01/20 1209 01/01/20 1500 01/01/20 2041 01/02/20 0453  BP: 132/71  120/70 122/69  Pulse: 86 85 68 72  Resp: (!) 24 20 18  (!) 24  Temp: 98.4 F (36.9 C)  98.8 F (37.1 C) 98.1 F (36.7 C)  TempSrc: Oral  Oral Oral  SpO2: 94% 93% 98% 93%  Weight:      Height:        Intake/Output Summary (Last 24 hours) at 01/02/2020 0805 Last data filed at 01/02/2020 0550 Gross per 24 hour  Intake 965.36 ml  Output 800 ml  Net 165.36 ml   Filed Weights   12/29/19 1058  Weight: 90.7 kg    Examination:  General exam: Appears calm and comfortable  Respiratory system: Crackles at the bases respiratory effort normal. Cardiovascular system: S1 & S2 heard, RRR. No JVD, murmurs, rubs, gallops or clicks. No pedal edema. Gastrointestinal system: Abdomen is nondistended, soft and nontender. No organomegaly or masses felt. Normal bowel sounds heard. Central nervous system: Alert and oriented. No focal neurological deficits. Extremities: Right lower extremity status post amputation Skin: No rashes, lesions or ulcers    Data Reviewed: I have personally reviewed following labs and imaging studies  CBC: Recent Labs  Lab 12/29/19 1120 12/30/19 0243 12/31/19 0139 01/01/20  0114  WBC 15.2* 12.5* 11.1* 11.4*  HGB 11.0* 10.6* 11.5* 11.4*  HCT 34.5* 33.4* 36.3* 35.4*  MCV 93.8 94.1 91.2 90.5  PLT 257 343 384 297   Basic Metabolic Panel: Recent Labs  Lab 12/30/19 0243 12/31/19 0139 12/31/19 0815 01/01/20 0114 01/02/20 0247  NA 138 138 136 136 162*  K 5.2*  4.7 5.2* 4.2 4.7  CL 102 102 102 99 96*  CO2 22 24 22 23 25   GLUCOSE 211* 218* 261* 269* 147*  BUN 45* 48* 55* 55* 39*  CREATININE 1.76* 1.96* 2.10* 1.88* 1.57*  CALCIUM 8.5* 8.9 8.7* 8.5* 7.0*  MG  --   --  2.3  --   --    GFR: Estimated Creatinine Clearance: 34.8 mL/min (A) (by C-G formula based on SCr of 1.57 mg/dL (H)). Liver Function Tests: Recent Labs  Lab 12/29/19 1120 12/31/19 0815  AST 74* 32  ALT 65* 17  ALKPHOS 81 85  BILITOT 2.3* 1.2  PROT 6.6 6.4*  ALBUMIN 2.7* 2.5*   No results for input(s): LIPASE, AMYLASE in the last 168 hours. No results for input(s): AMMONIA in the last 168 hours. Coagulation Profile: Recent Labs  Lab 12/29/19 1217  INR 1.2   Cardiac Enzymes: No results for input(s): CKTOTAL, CKMB, CKMBINDEX, TROPONINI in the last 168 hours. BNP (last 3 results) No results for input(s): PROBNP in the last 8760 hours. HbA1C: No results for input(s): HGBA1C in the last 72 hours. CBG: Recent Labs  Lab 01/01/20 0820 01/01/20 1206 01/01/20 1708 01/01/20 2127 01/02/20 0610  GLUCAP 218* 273* 197* 242* 298*   Lipid Profile: No results for input(s): CHOL, HDL, LDLCALC, TRIG, CHOLHDL, LDLDIRECT in the last 72 hours. Thyroid Function Tests: No results for input(s): TSH, T4TOTAL, FREET4, T3FREE, THYROIDAB in the last 72 hours. Anemia Panel: No results for input(s): VITAMINB12, FOLATE, FERRITIN, TIBC, IRON, RETICCTPCT in the last 72 hours. Sepsis Labs: No results for input(s): PROCALCITON, LATICACIDVEN in the last 168 hours.  Recent Results (from the past 240 hour(s))  SARS CORONAVIRUS 2 (TAT 6-24 HRS) Nasopharyngeal Nasopharyngeal Swab     Status: None   Collection Time: 12/28/19  2:46 PM   Specimen: Nasopharyngeal Swab  Result Value Ref Range Status   SARS Coronavirus 2 NEGATIVE NEGATIVE Final    Comment: (NOTE) SARS-CoV-2 target nucleic acids are NOT DETECTED. The SARS-CoV-2 RNA is generally detectable in upper and lower respiratory specimens  during the acute phase of infection. Negative results do not preclude SARS-CoV-2 infection, do not rule out co-infections with other pathogens, and should not be used as the sole basis for treatment or other patient management decisions. Negative results must be combined with clinical observations, patient history, and epidemiological information. The expected result is Negative. Fact Sheet for Patients: SugarRoll.be Fact Sheet for Healthcare Providers: https://www.woods-mathews.com/ This test is not yet approved or cleared by the [place] FDA and  has been authorized for detection and/or diagnosis of SARS-CoV-2 by FDA under an Emergency Use Authorization (EUA). This EUA will remain  in effect (meaning this test can be used) for the duration of the COVID-19 declaration under Section 56 4(b)(1) of the Act, 21 U.S.C. section 360bbb-3(b)(1), unless the authorization is terminated or revoked sooner. Performed at [facility], [place] [address], [place], [place] [address]          Radiology Studies: NM Pulmonary Perfusion  Result Date: 12/31/2019 CLINICAL DATA:  Decreased oxygen saturation. Recent lower extremity amputation EXAM: NUCLEAR MEDICINE PERFUSION LUNG SCAN  TECHNIQUE: Perfusion images were obtained in multiple projections after intravenous injection of radiopharmaceutical. Ventilation scans intentionally deferred if perfusion scan and chest x-ray adequate for interpretation during COVID 19 epidemic. Views: Anterior, posterior, left lateral, right lateral, RPO, LPO, RAO, LAO RADIOPHARMACEUTICALS:  1.59 mCi Tc-30m MAA IV COMPARISON:  Chest radiograph December 31, 2019 FINDINGS: Radiotracer uptake is homogeneous and symmetric bilaterally. No appreciable perfusion defects. IMPRESSION: No appreciable perfusion defects. Very low probability of pulmonary embolus. Electronically Signed   By: Lowella Grip III M.D.   On: 12/31/2019 10:46    ECHOCARDIOGRAM COMPLETE  Result Date: 12/31/2019   ECHOCARDIOGRAM REPORT   Patient Name:   AMRITPAL SHROPSHIRE Date of Exam: 12/31/2019 Medical Rec #:  [id number]       Height:       69.0 in Accession #:    [id number]      Weight:       200.0 lb Date of Birth:  09-Jan-1927       BSA:          2.07 m Patient Age:    29 years        BP:           108/56 mmHg Patient Gender: M               HR:           85 bpm. Exam Location:  Inpatient Procedure: 2D Echo, Cardiac Doppler and Color Doppler STAT ECHO Indications:    I50.33 Acute on chronic diastolic (congestive) heart failure  History:        Patient has prior history of Echocardiogram examinations, most                 recent 05/28/2015. Stroke; Risk Factors:Hypertension, Diabetes,                 Dyslipidemia and GERD. Cancer.  Sonographer:    Tiffany Dance Referring Phys: [id number] RONDELL A SMITH IMPRESSIONS  1. Left ventricular ejection fraction, by visual estimation, is 30 to 35%. The left ventricle has moderate to severely decreased function. There is mildly increased left ventricular hypertrophy.  2. Left ventricular diastolic parameters are consistent with Grade II diastolic dysfunction (pseudonormalization).  3. The left ventricle demonstrates regional wall motion abnormalities.  4. Global right ventricle has normal systolic function.The right ventricular size is normal.  5. Left atrial size was mildly dilated.  6. Right atrial size was normal.  7. The mitral valve is normal in structure. Mild mitral valve regurgitation. No evidence of mitral stenosis.  8. The tricuspid valve is normal in structure.  9. The aortic valve is tricuspid. Aortic valve regurgitation is trivial. Mild aortic valve sclerosis without stenosis. 10. The pulmonic valve was normal in structure. Pulmonic valve regurgitation is not visualized. 11. The inferior vena cava is dilated in size with >50% respiratory variability, suggesting right atrial pressure of 8 mmHg. 12. Severe hypokinesis of the  distal septum and apex; overall moderate to severe LV dysfunction; grade 2 diastolic dsyfunction; mild LVH; mild LAE; mild MR. FINDINGS  Left Ventricle: Left ventricular ejection fraction, by visual estimation, is 30 to 35%. The left ventricle has moderate to severely decreased function. The left ventricle demonstrates regional wall motion abnormalities. There is mildly increased left ventricular hypertrophy. Left ventricular diastolic parameters are consistent with Grade II diastolic dysfunction (pseudonormalization). Normal left atrial pressure. Right Ventricle: The right ventricular size is normal.Global RV systolic function is has normal systolic function. The tricuspid regurgitant velocity  is 2.33 m/s, and with an assumed right atrial pressure of 8 mmHg, the estimated right ventricular systolic pressure is normal at 29.7 mmHg. Left Atrium: Left atrial size was mildly dilated. Right Atrium: Right atrial size was normal in size Pericardium: There is no evidence of pericardial effusion. Mitral Valve: The mitral valve is normal in structure. Mild mitral valve regurgitation. No evidence of mitral valve stenosis by observation. Tricuspid Valve: The tricuspid valve is normal in structure. Tricuspid valve regurgitation is mild. Aortic Valve: The aortic valve is tricuspid. Aortic valve regurgitation is trivial. Aortic regurgitation PHT measures 466 msec. Mild aortic valve sclerosis is present, with no evidence of aortic valve stenosis. Pulmonic Valve: The pulmonic valve was normal in structure. Pulmonic valve regurgitation is not visualized. Pulmonic regurgitation is not visualized. Aorta: The aortic root is normal in size and structure. Venous: The inferior vena cava is dilated in size with greater than 50% respiratory variability, suggesting right atrial pressure of 8 mmHg. IAS/Shunts: No atrial level shunt detected by color flow Doppler. Additional Comments: Severe hypokinesis of the distal septum and apex; overall  moderate to severe LV dysfunction; grade 2 diastolic dsyfunction; mild LVH; mild LAE; mild MR.  LEFT VENTRICLE PLAX 2D LVIDd:         5.16 cm LVIDs:         4.71 cm LV PW:         1.21 cm LV IVS:        1.18 cm LVOT diam:     2.20 cm LV SV:         24 ml LV SV Index:   11.46 LVOT Area:     3.80 cm  RIGHT VENTRICLE             IVC RV Basal diam:  3.46 cm     IVC diam: 2.36 cm RV Mid diam:    1.71 cm RV S prime:     11.90 cm/s TAPSE (M-mode): 2.0 cm LEFT ATRIUM             Index       RIGHT ATRIUM           Index LA diam:        5.10 cm 2.47 cm/m  RA Area:     20.00 cm LA Vol (A2C):   83.0 ml 40.17 ml/m RA Volume:   59.70 ml  28.90 ml/m LA Vol (A4C):   67.7 ml 32.77 ml/m LA Biplane Vol: 76.5 ml 37.03 ml/m  AORTIC VALVE LVOT Vmax:   62.05 cm/s LVOT Vmean:  39.250 cm/s LVOT VTI:    0.096 m AI PHT:      466 msec  AORTA Ao Root diam: 3.70 cm Ao Asc diam:  3.50 cm MITRAL VALVE                        TRICUSPID VALVE MV Area (PHT): 4.39 cm             TR Peak grad:   21.7 mmHg MV PHT:        50.17 msec           TR Vmax:        233.00 cm/s MV Decel Time: 173 msec MV E velocity: 98.50 cm/s 103 cm/s  SHUNTS MV A velocity: 59.20 cm/s 70.3 cm/s Systemic VTI:  0.10 m MV E/A ratio:  1.66       1.5       Systemic  Diam: 2.20 cm  Kirk Ruths MD Electronically signed by Kirk Ruths MD Signature Date/Time: 12/31/2019/1:23:45 PM    Final         Scheduled Meds: . apixaban  2.5 mg Oral BID  . carvedilol  3.125 mg Oral BID WC  . docusate sodium  100 mg Oral Daily  . insulin aspart  0-15 Units Subcutaneous Q6H  . pantoprazole  40 mg Oral Daily   Continuous Infusions: . amiodarone 30 mg/hr (01/01/20 2153)  . magnesium sulfate bolus IVPB       LOS: 4 days    Time spent: 35 minutes    Topacio Cella A Baxter Gonzalez, MD [facility]   If 7PM-7AM, please contact night-coverage [email or web address] Password [facility] 01/02/2020, 8:05 AM

## 2020-01-02 NOTE — Progress Notes (Addendum)
[facility] [place]  Subjective  - A & O no new complaints   Objective 122/69 72 98.1 F (36.7 C) (Oral) (!) 24 93%  Intake/Output Summary (Last 24 hours) at 01/02/2020 0724 Last data filed at 01/02/2020 0550 Gross per 24 hour  Intake 965.36 ml  Output 800 ml  Net 165.36 ml    Right AKA staples intact.  Stump warm. Heart RRR Lungs non labored breathing  Assessment/Planning: POD # 3 Right AKA  Stump is viable.   PT recommending  CIR I think family wants him to go home.   He has no equipment needs.    A fib convert to NSR will be discharged on oral amiodarone and apixaban 2.5mg  BID per cardiology recommendations.   Cardiomyopathy-echocardiogram shows reduced LV function.  Troponin minimally elevated but in the setting of atrial fibrillation and renal insufficiency.  I do not think he would be a good candidate for invasive cardiac evaluation.  I will add low-dose carvedilol and advance as tolerated.  Add hydralazine/nitrates later as blood pressure allows.  Avoid ARB or Entresto due to renal insufficiency.  Acute diastolic congestive heart failure-previous chest x-ray showed pulmonary edema; possibly related to atrial fibrillation.  BUN/creatinine higher today.  Will decrease Lasix to 40 mg IV daily and follow.  Note oxygen requirements have decreased.  Respiratory insufficiency-oxygen requirements decreased today.  VQ scan negative.  Likely from pulmonary edema related to atrial fibrillation.  Will order labs for this am.   [name] 01/02/2020 7:24 AM --  Laboratory Lab Results: Recent Labs    12/31/19 0139 01/01/20 0114  WBC 11.1* 11.4*  HGB 11.5* 11.4*  HCT 36.3* 35.4*  PLT 384 373   BMET Recent Labs    01/01/20 0114 01/02/20 0247  NA 136 162*  K 4.2 4.7  CL 99 96*  CO2 23 25  GLUCOSE 269* 147*  BUN 55* 39*  CREATININE 1.88* 1.57*  CALCIUM 8.5* 7.0*    COAG Lab Results  Component Value Date   INR 1.2 12/29/2019    INR 1.14 05/26/2015   No results found for: PTT   I have independently interviewed and patient and agree with PA assessment and plan above.  AKA appears to be healing well.  He is doing much better now out of atrial fibrillation breathing well on 4 L nasal cannula.  Will need case management for evaluation as family desires to take patient home due to concerns of COVID-19.  Aela Bohan C. Donzetta Matters, MD [facility] [place] Office: [phone] Pager: [phone]

## 2020-01-02 NOTE — Progress Notes (Signed)
Physical Therapy Treatment Patient Details Name: Henry Wiggins MRN: [id number] DOB: 03-13-1927 Today's Date: 01/02/2020    History of Present Illness Patient is a 86 y/o male who presents s/p right AKA 12/29/19. PMH includes CVA, HTN, HLD, memory loss, DM, depression, bladder ca.    PT Comments    Patient is making progress toward PT goals. Pt continues to require mod A +2 for bed mobility and min/mod A +2 for functional transfer training. Still feel pt is a good candidate for CIR level therapies to maximize independence and safety with mobility prior to d/c home with daughter's assistance.     Follow Up Recommendations  CIR;Supervision for mobility/OOB;Supervision/Assistance - 24 hour     Equipment Recommendations  If daughter declines CIR pt will need w/c, w/c cushion, hospital bed, and sliding board   Recommendations for Other Services       Precautions / Restrictions Precautions Precautions: Fall;Other (comment) Precaution Comments: watch 02 Restrictions Weight Bearing Restrictions: Yes RLE Weight Bearing: Non weight bearing    Mobility  Bed Mobility Overal bed mobility: Needs Assistance Bed Mobility: Supine to Sit     Supine to sit: Mod assist;+2 for physical assistance;HOB elevated     General bed mobility comments: assist to bring hips to EOB and to elevate trunk into sitting; use of rails; max cues for sequencing   Transfers Overall transfer level: Needs assistance Equipment used: Rolling walker (2 wheeled) Transfers: Sit to/from [facility] Sit to Stand: Min assist;Mod assist;+2 physical assistance;+2 safety/equipment Stand pivot transfers: Mod assist;+2 physical assistance;+2 safety/equipment       General transfer comment: pt stood from elevated bed height with min A +2 and able to pivot to recliner ans stand second trial from recliner with mod A +2; cues for positioning and hand placement each trial and assistance and cues for sequencing  when pivoting; pt began sitting prematurely and hips guided to recliner   Ambulation/Gait             General Gait Details: Unable   Stairs             Wheelchair Mobility    Modified Rankin (Stroke Patients Only)       Balance Overall balance assessment: Needs assistance Sitting-balance support: Bilateral upper extremity supported Sitting balance-Leahy Scale: Fair Sitting balance - Comments: Supervision for safety   Standing balance support: Bilateral upper extremity supported Standing balance-Leahy Scale: Poor                              Cognition Arousal/Alertness: Awake/alert Behavior During Therapy: WFL for tasks assessed/performed Overall Cognitive Status: No family/caregiver present to determine baseline cognitive functioning Area of Impairment: Orientation;Memory;Following commands;Problem solving                 Orientation Level: Disoriented to;Place;Time;Situation   Memory: Decreased short-term memory Following Commands: Follows one step commands with increased time     Problem Solving: Slow processing;Decreased initiation;Difficulty sequencing;Requires verbal cues;Requires tactile cues General Comments: HOH      Exercises      General Comments General comments (skin integrity, edema, etc.): SpO2 desat to 87% on RA during session; O2 reapplied end of session      Pertinent Vitals/Pain Pain Assessment: No/denies pain    Home Living                      Prior Function  PT Goals (current goals can now be found in the care plan section) Progress towards PT goals: Progressing toward goals    Frequency    Min 3X/week      PT Plan Current plan remains appropriate    Co-evaluation              AM-PAC PT "6 Clicks" Mobility   Outcome Measure  Help needed turning from your back to your side while in a flat bed without using bedrails?: A Lot Help needed moving from lying on your back  to sitting on the side of a flat bed without using bedrails?: A Lot Help needed moving to and from a bed to a chair (including a wheelchair)?: A Lot Help needed standing up from a chair using your arms (e.g., wheelchair or bedside chair)?: A Lot Help needed to walk in hospital room?: Total Help needed climbing 3-5 steps with a railing? : Total 6 Click Score: 10    End of Session Equipment Utilized During Treatment: Gait belt Activity Tolerance: Patient tolerated treatment well Patient left: in chair;with call bell/phone within reach(chair alarm pad under pt but no box. RN notified) Nurse Communication: Mobility status PT Visit Diagnosis: Pain;Muscle weakness (generalized) (M62.81) Pain - Right/Left: Right Pain - part of body: Leg     Time: 9295-7473 PT Time Calculation (min) (ACUTE ONLY): 28 min  Charges:  $Gait Training: 8-22 mins $Therapeutic Activity: 8-22 mins                     Earney Navy, PTA Acute Rehabilitation Services Pager: [phone] Office: [phone]     Darliss Cheney 01/02/2020, 4:29 PM

## 2020-01-02 NOTE — Progress Notes (Signed)
CRITICAL VALUE ALERT  Critical Value:  Sodium 162  Date & Time Notied:  01/02/20, 0451  Provider Notified: on call [facility] APP  Orders Received/Actions taken: new orders received.

## 2020-01-02 NOTE — Progress Notes (Signed)
Transitions of Care Pharmacist Note  Henry Wiggins is a 86 y.o. male that has been diagnosed with post-op Afib and may be prescribed Eliquis (apixaban) at discharge.   Patient Education: I provided the following education on Apixaban to the patient: How to take the medication Described what the medication is Signs of bleeding Signs/symptoms of VTE and stroke  Answered their questions  Discharge Medications Plan: The patient wants to have their discharge medications filled by the Transitions of Care pharmacy rather than their usual pharmacy.  The discharge orders pharmacy has been changed to the Transitions of Care pharmacy, the patient will receive a phone call regarding co-pay, and their medications will be delivered by the Transitions of Care pharmacy.    Thank you,   Sherren Kerns, PharmD PGY1 Acute Care Pharmacy Resident January 02, 2020

## 2020-01-02 NOTE — Progress Notes (Addendum)
Progress Note  Patient Name: Navi Erber Demorest Date of Encounter: 01/02/2020  Primary Cardiologist: Sanda Klein, MD   Subjective   Patient denies CP. He is on 3 L O2. Maintaining NSR.   Inpatient Medications    Scheduled Meds: . apixaban  2.5 mg Oral BID  . carvedilol  3.125 mg Oral BID WC  . docusate sodium  100 mg Oral Daily  . feeding supplement  1 Container Oral BID BM  . furosemide  40 mg Intravenous Daily  . insulin aspart  0-15 Units Subcutaneous Q6H  . insulin glargine  6 Units Subcutaneous Daily  . pantoprazole  40 mg Oral Daily  . polyethylene glycol  17 g Oral Daily  . senna  1 tablet Oral Daily   Continuous Infusions: . amiodarone 30 mg/hr (01/02/20 0921)  . magnesium sulfate bolus IVPB     PRN Meds: acetaminophen **OR** acetaminophen, alum & mag hydroxide-simeth, guaiFENesin-dextromethorphan, hydrALAZINE, labetalol, magnesium sulfate bolus IVPB, morphine injection, ondansetron, phenol, potassium chloride   Vital Signs    Vitals:   01/01/20 1500 01/01/20 2041 01/02/20 0453 01/02/20 0900  BP:  120/70 122/69 131/67  Pulse: 85 68 72 73  Resp: 20 18 (!) 24 15  Temp:  98.8 F (37.1 C) 98.1 F (36.7 C)   TempSrc:  Oral Oral   SpO2: 93% 98% 93% 96%  Weight:      Height:        Intake/Output Summary (Last 24 hours) at 01/02/2020 1145 Last data filed at 01/02/2020 0550 Gross per 24 hour  Intake 965.36 ml  Output 500 ml  Net 465.36 ml   Last 3 Weights 12/29/2019 12/28/2019 12/19/2019  Weight (lbs) 200 lb 200 lb 200 lb  Weight (kg) 90.719 kg 90.719 kg 90.719 kg      Telemetry    NSR, HR 70s - Personally Reviewed  ECG    No new - Personally Reviewed  Physical Exam   GEN: No acute distress.   Neck: No JVD Cardiac: RRR, no murmurs, rubs, or gallops.  Respiratory: Clear to auscultation bilaterally. GI: Soft, nontender, non-distended  MS: R AKA amputation; No edema; No deformity. Neuro:  Nonfocal  Psych: Normal affect   Labs    High  Sensitivity Troponin:   Recent Labs  Lab 12/31/19 0815 12/31/19 1129 12/31/19 1420 12/31/19 1658  TROPONINIHS 293* 552* 596* 650*      Chemistry Recent Labs  Lab 12/29/19 1120 12/31/19 0815 01/02/20 0247 01/02/20 0658 01/02/20 0952  NA 139 136 162* 132* 133*  K 5.7* 5.2* 4.7 4.2 4.0  CL 105 102 96* 96* 96*  CO2 22 22 25 24 25   GLUCOSE 208* 261* 147* 292* 270*  BUN 48* 55* 39* 60* 59*  CREATININE 2.02* 2.10* 1.57* 1.97* 1.98*  CALCIUM 9.0 8.7* 7.0* 7.9* 8.3*  PROT 6.6 6.4*  --   --   --   ALBUMIN 2.7* 2.5*  --   --   --   AST 74* 32  --   --   --   ALT 65* 17  --   --   --   ALKPHOS 81 85  --   --   --   BILITOT 2.3* 1.2  --   --   --   GFRNONAA 28* 27* 38* 29* 29*  GFRAA 33* 31* 44* 34* 33*  ANIONGAP 12 12 41* 12 12     Hematology Recent Labs  Lab 12/31/19 0139 01/01/20 0114 01/02/20 0952  WBC 11.1* 11.4*  11.7*  RBC 3.98* 3.91* 4.07*  HGB 11.5* 11.4* 11.8*  HCT 36.3* 35.4* 36.9*  MCV 91.2 90.5 90.7  MCH 28.9 29.2 29.0  MCHC 31.7 32.2 32.0  RDW 13.7 14.1 13.8  PLT 384 373 360    BNP Recent Labs  Lab 12/31/19 0139  BNP 1,620.0*     DDimer No results for input(s): DDIMER in the last 168 hours.   Radiology    No results found.  Cardiac Studies   Echo 12/31/19  1. Left ventricular ejection fraction, by visual estimation, is 30 to 35%. The left ventricle has moderate to severely decreased function. There is mildly increased left ventricular hypertrophy.  2. Left ventricular diastolic parameters are consistent with Grade II diastolic dysfunction (pseudonormalization).  3. The left ventricle demonstrates regional wall motion abnormalities.  4. Global right ventricle has normal systolic function.The right ventricular size is normal.  5. Left atrial size was mildly dilated.  6. Right atrial size was normal.  7. The mitral valve is normal in structure. Mild mitral valve regurgitation. No evidence of mitral stenosis.  8. The tricuspid valve is normal in  structure.  9. The aortic valve is tricuspid. Aortic valve regurgitation is trivial. Mild aortic valve sclerosis without stenosis. 10. The pulmonic valve was normal in structure. Pulmonic valve regurgitation is not visualized. 11. The inferior vena cava is dilated in size with >50% respiratory variability, suggesting right atrial pressure of 8 mmHg. 12. Severe hypokinesis of the distal septum and apex; overall moderate to severe LV dysfunction; grade 2 diastolic dsyfunction; mild LVH; mild LAE; mild MR.  Patient Profile     86 y.o. male with a hx of DM2, HTN, hyperlipidemia, prior CVA, peripheral vascular disease and now s/p AKA who is being seen for the evaluation of post-op afib.  Assessment & Plan    Pos-op afib - started on IV amio and converted to NSR -  Can transition to oral amiodarone.  - Eliquis 2.5 mg BID was started  S/P AKA - Post op day 3  Acute diastolic CHF - CXR showed pulmonary edema - BUN/creatinine were elevated and Lasix was decreased to 40 mg daily - creatinine 1.97> 1.98 - Net since admission -1.8 L - Daily weights not recorded - Patient is euvolemic on exam although he still has supplemental O2. Consider switching to PO lasix for tomorrow  Cardiomyopathy - Echo showed reduced LV function, EF 30-35%, G2DD with hypokinesis of the distal septum and apex  - Troponin minimally elevated which is likely due to Afib - Patient is not a good candidate for an invasive procedure.  - Low dose Coreg added. Pressures are stable. Can likely increase - No Ace/ARB 2/2 renal insufficiency  AKI/CKD III - lasix decreased    For questions or updates, please contact [place] Please consult [email or web address] for contact info under        Signed, Cadence Ninfa Meeker, PA-C  01/02/2020, 11:45 AM    Patient seen and examined with Cadence Furth PA-C.  Agree as above, with the following exceptions and changes as noted below. Gen: NAD, CV: RRR, no murmurs, Lungs: diffuse  rhonchi, Abd: soft, Neuro/Psych: alert and oriented x 3, normal mood and affect. All available labs, radiology testing, previous records reviewed.   Will transition to oral amiodarone to complete 5 gram load, then 200 mg daily for approximately 30 days and reassess as outpatient. Dose adjusted eliquis on board. Continue lasix 40 mg IV today and can transition to oral tomorrow likely.  Elouise Munroe 01/02/20 6:02 PM

## 2020-01-02 NOTE — Discharge Instructions (Signed)

## 2020-01-02 NOTE — Progress Notes (Signed)
Inpatient Rehab Admissions:  Inpatient Rehab Consult received.  I met with patient at the bedside for rehabilitation assessment and to discuss goals and expectations of an inpatient rehab admission.  He states that he would prefer to go home but seems open to CIR if needed.  I spoke with pt's daughter and she is adamant that pt will come home to her house.  I did explain that, in pt's most recent therapy session he was requiring 2 person assist to come to the EOB and was not able to transfer.  Pt has not worked with therapy yet today.  Feel it is appropriate to see how he does today, since his cognition has cleared a great deal since eval on Friday.  Will f/u after therapy to see how patient does and then speak with daughter regarding dispo.    Signed: Shann Medal, PT, DPT Admissions Coordinator [phone] 01/02/20  11:59 AM

## 2020-01-02 NOTE — Care Management Important Message (Signed)
Important Message  Patient Details  Name: Henry Wiggins MRN: [id number] Date of Birth: 1927-01-18   Medicare Important Message Given:  Yes     Shelda Altes 01/02/2020, 1:02 PM

## 2020-01-03 DIAGNOSIS — R0902 Hypoxemia: Secondary | ICD-10-CM

## 2020-01-03 LAB — BASIC METABOLIC PANEL
Anion gap: 11 (ref 5–15)
BUN: 61 mg/dL — ABNORMAL HIGH (ref 8–23)
CO2: 27 mmol/L (ref 22–32)
Calcium: 8.3 mg/dL — ABNORMAL LOW (ref 8.9–10.3)
Chloride: 97 mmol/L — ABNORMAL LOW (ref 98–111)
Creatinine, Ser: 1.98 mg/dL — ABNORMAL HIGH (ref 0.61–1.24)
GFR calc Af Amer: 33 mL/min — ABNORMAL LOW (ref >60)
GFR calc non Af Amer: 29 mL/min — ABNORMAL LOW (ref >60)
Glucose, Bld: 196 mg/dL — ABNORMAL HIGH (ref 70–99)
Potassium: 4 mmol/L (ref 3.5–5.1)
Sodium: 135 mmol/L (ref 135–145)

## 2020-01-03 LAB — GLUCOSE, CAPILLARY
Glucose-Capillary: 195 mg/dL — ABNORMAL HIGH (ref 70–99)
Glucose-Capillary: 315 mg/dL — ABNORMAL HIGH (ref 70–99)
Glucose-Capillary: 203 mg/dL — ABNORMAL HIGH (ref 70–99)
Glucose-Capillary: 155 mg/dL — ABNORMAL HIGH (ref 70–99)

## 2020-01-03 MED ORDER — FUROSEMIDE 40 MG PO TABS
40.0000 mg | ORAL_TABLET | Freq: Every day | ORAL | Status: DC
  Administered 2020-01-04: 08:00:00 40 mg via ORAL
  Filled 2020-01-03 (×2): qty 1

## 2020-01-03 MED ORDER — INSULIN GLARGINE 100 UNIT/ML ~~LOC~~ SOLN
12.0000 [IU] | Freq: Every day | SUBCUTANEOUS | Status: DC
  Administered 2020-01-03: 12 [IU] via SUBCUTANEOUS
  Filled 2020-01-03 (×2): qty 0.12

## 2020-01-03 MED ORDER — FUROSEMIDE 40 MG PO TABS
40.0000 mg | ORAL_TABLET | Freq: Every day | ORAL | Status: DC
  Filled 2020-01-03: qty 1

## 2020-01-03 MED ORDER — FUROSEMIDE 40 MG PO TABS
40.0000 mg | ORAL_TABLET | Freq: Two times a day (BID) | ORAL | Status: DC
  Administered 2020-01-03: 12:00:00 40 mg via ORAL

## 2020-01-03 NOTE — Progress Notes (Addendum)
Progress Note  Patient Name: Henry Wiggins Date of Encounter: 01/03/2020  Primary Cardiologist: Sanda Klein, MD   Subjective   Denies CP or SOB. Today he is confused as to where he is at. He remains in NSR.   Inpatient Medications    Scheduled Meds: . [START ON 01/05/2020] amiodarone  200 mg Oral Daily  . amiodarone  400 mg Oral BID  . apixaban  2.5 mg Oral BID  . carvedilol  3.125 mg Oral BID WC  . docusate sodium  100 mg Oral Daily  . feeding supplement  1 Container Oral BID BM  . furosemide  40 mg Intravenous Daily  . insulin aspart  0-15 Units Subcutaneous Q6H  . insulin glargine  12 Units Subcutaneous Daily  . pantoprazole  40 mg Oral Daily  . polyethylene glycol  17 g Oral Daily  . senna  1 tablet Oral Daily   Continuous Infusions: . magnesium sulfate bolus IVPB     PRN Meds: acetaminophen **OR** acetaminophen, alum & mag hydroxide-simeth, guaiFENesin-dextromethorphan, hydrALAZINE, labetalol, magnesium sulfate bolus IVPB, morphine injection, ondansetron, phenol, potassium chloride   Vital Signs    Vitals:   01/02/20 2230 01/03/20 0435 01/03/20 0507 01/03/20 0747  BP:  126/68  126/72  Pulse:  65  63  Resp:  (!) 26 20 19   Temp:  97.7 F (36.5 C)  97.8 F (36.6 C)  TempSrc:  Oral  Oral  SpO2: 92% 92%  96%  Weight:      Height:        Intake/Output Summary (Last 24 hours) at 01/03/2020 0929 Last data filed at 01/02/2020 2050 Gross per 24 hour  Intake --  Output 850 ml  Net -850 ml   Last 3 Weights 12/29/2019 12/28/2019 12/19/2019  Weight (lbs) 200 lb 200 lb 200 lb  Weight (kg) 90.719 kg 90.719 kg 90.719 kg      Telemetry    NSR, 60-70 - Personally Reviewed  ECG    No new - Personally Reviewed  Physical Exam   GEN: No acute distress.   Neck: No JVD Cardiac: RRR, no murmurs, rubs, or gallops.  Respiratory: Clear to auscultation bilaterally. GI: Soft, nontender, non-distended  MS: R AKA No edema; No deformity. Neuro:  Nonfocal  Psych:  Normal affect   Labs    High Sensitivity Troponin:   Recent Labs  Lab 12/31/19 0815 12/31/19 1129 12/31/19 1420 12/31/19 1658  TROPONINIHS 293* 552* 596* 650*      Chemistry Recent Labs  Lab 12/29/19 1120 12/31/19 0815 01/02/20 0658 01/02/20 0952 01/03/20 0813  NA 139 136 132* 133* 135  K 5.7* 5.2* 4.2 4.0 4.0  CL 105 102 96* 96* 97*  CO2 22 22 24 25 27   GLUCOSE 208* 261* 292* 270* 196*  BUN 48* 55* 60* 59* 61*  CREATININE 2.02* 2.10* 1.97* 1.98* 1.98*  CALCIUM 9.0 8.7* 7.9* 8.3* 8.3*  PROT 6.6 6.4*  --   --   --   ALBUMIN 2.7* 2.5*  --   --   --   AST 74* 32  --   --   --   ALT 65* 17  --   --   --   ALKPHOS 81 85  --   --   --   BILITOT 2.3* 1.2  --   --   --   GFRNONAA 28* 27* 29* 29* 29*  GFRAA 33* 31* 34* 33* 33*  ANIONGAP 12 12 12 12  11  Hematology Recent Labs  Lab 12/31/19 0139 01/01/20 0114 01/02/20 0952  WBC 11.1* 11.4* 11.7*  RBC 3.98* 3.91* 4.07*  HGB 11.5* 11.4* 11.8*  HCT 36.3* 35.4* 36.9*  MCV 91.2 90.5 90.7  MCH 28.9 29.2 29.0  MCHC 31.7 32.2 32.0  RDW 13.7 14.1 13.8  PLT 384 373 360    BNP Recent Labs  Lab 12/31/19 0139  BNP 1,620.0*     DDimer No results for input(s): DDIMER in the last 168 hours.   Radiology    No results found.  Cardiac Studies   Echo 12/31/19 1. Left ventricular ejection fraction, by visual estimation, is 30 to 35%. The left ventricle has moderate to severely decreased function. There is mildly increased left ventricular hypertrophy. 2. Left ventricular diastolic parameters are consistent with Grade II diastolic dysfunction (pseudonormalization). 3. The left ventricle demonstrates regional wall motion abnormalities. 4. Global right ventricle has normal systolic function.The right ventricular size is normal. 5. Left atrial size was mildly dilated. 6. Right atrial size was normal. 7. The mitral valve is normal in structure. Mild mitral valve regurgitation. No evidence of mitral stenosis. 8.  The tricuspid valve is normal in structure. 9. The aortic valve is tricuspid. Aortic valve regurgitation is trivial. Mild aortic valve sclerosis without stenosis. 10. The pulmonic valve was normal in structure. Pulmonic valve regurgitation is not visualized. 11. The inferior vena cava is dilated in size with >50% respiratory variability, suggesting right atrial pressure of 8 mmHg. 12. Severe hypokinesis of the distal septum and apex; overall moderate to severe LV dysfunction; grade 2 diastolic dsyfunction; mild LVH; mild LAE; mild MR.  Patient Profile     86 y.o. male  with a hx of DM2, HTN, hyperlipidemia, prior CVA, peripheral vascular disease and now s/p AKA who is being seen for the evaluation of post-op afib.  Assessment & Plan    Pos-op afib - started on IV amio and converted to NSR - Eliquis 2.5 mg BID was started - Amio switched to PO amio to complete a 5gm load and will continue with 200 mg daily  S/P AKA - Post op day 4  Acute diastolic CHF - CXR showed pulmonary edema - BUN/creatinine were elevated and Lasix was decreased to 40 mg daily - creatinine 1.97> 1.98 > 1.98 - Net since admission -2.7 L - Daily weights not recorded - Patient is euvolemic on exam although he still has supplemental O2. Lungs sound clear on exam. Would try to wean - Will change IV to PO lasix  Cardiomyopathy - Echo showed reduced LV function, EF 30-35%, G2DD with hypokinesis of the distal septum and apex  - Troponin minimally elevated which is likely due to Afib - Patient is not a good candidate for an invasive procedure.  - Low dose Coreg added. Pressures are stable. Can likely increase - No Ace/ARB 2/2 renal insufficiency  AKI/CKD III - lasix decreased   For questions or updates, please contact [place] Please consult [email or web address] for contact info under        Signed, Cadence Ninfa Meeker, PA-C  01/03/2020, 9:29 AM    Patient seen and examined with Cadence Furth PAC.  Agree as  above, with the following exceptions and changes as noted below. Patient resting comfortably. Gen: NAD, CV: RRR, no murmurs, Lungs: clear, Abd: soft, Extrem: no edema, Neuro/Psych: normal mood and affect. All available labs, radiology testing, previous records reviewed. Will transition to oral lasix, with rising Cr. May need titration of lasix as outpatient though  likely appropriate diuresed. Continue eliquis dose adjusted and amiodarone oral.   Elouise Munroe 01/03/20 11:40 AM

## 2020-01-03 NOTE — Progress Notes (Signed)
Inpatient Rehab Admissions Coordinator:   Spoke with pts daughter and she prefers to take pt home with home health.  Will let CM know and sign off for CIR.   Shann Medal, PT, DPT Admissions Coordinator [phone] 01/03/20  10:08 AM

## 2020-01-03 NOTE — Progress Notes (Signed)
PROGRESS NOTE    Henry Wiggins  YQM:[id number] DOB: 24-Feb-1927 DOA: 12/29/2019 PCP: Henry Contras, MD   Brief Narrative: Per consulting physician: Henry Wiggins an 86 y.o.malewith past medical history significant for hypertension, hyperlipidemia, CVA, PVD, and DM type II; who presented for progressive ischemia requiring above-knee amputation on 12/29/2019 by Dr. Donzetta Wiggins. Since the procedure the patient had progressively worsening shortness of breath. He had been initially given IV Lasix after chest x-ray revealed interstitial opacities concerning for edema. This morning patient however developed worsening respiratory distress and was placed on a Venturi mask. Patient does not provide give any additional history. TRH consult to help assist in evaluation of patient's respiratory failure.  Patient oxygen requirement continued to improve amiodarone drip.  Lexis.  He is today at 4 L of oxygen.  Assessment & Plan:   Active Problems:   PAD (peripheral artery disease) (HCC)   S/P AKA (above knee amputation) (HCC)   Paroxysmal atrial fibrillation (HCC)   1-postoperative day 4 from right AKA Per vascular. Might need CIR.  CIR consulted.  2-Acute respiratory failure with hypoxemia secondary to acute diastolic heart failure exacerbation: Patient developed progressive shortness of breath chest x-ray show pulmonary edema.  ABG with a PO2 of 55. Plan to transition to oral lasix today, defer to cardiology.  Cardiology following. Improved, down on 2.5 L. He required higher amount of oxygen at night.   3-postoperative A. Fib: Started on IV amiodarone drip On Eliquis On oral  amiodarone  Elevation  of troponin: Demand ischemia secondary to diastolic heart failure Cardiology  Following.  AKI on chronic kidney disease a stage III Creatinine peaked to 2.1.. Monitor on IV Lasix. Prior creatinine 2 weeks ago 1.7--1.5 Cr stable at 1.9. Lasix change to oral.   Hypertension: Hypernatremia:  Likely lab error  DM ; with hyperglycemia; started  lantus. Continue with SSI Will likely need to hold metformin at discharge due to elevated cr.  Will increase lantus to 12 units. Depending CBG might need novolog as well at discharge  Estimated body mass index is 29.53 kg/m as calculated from the following:   Height as of this encounter: 5\' 9"  (1.753 m).   Weight as of this encounter: 90.7 kg.   DVT prophylaxis: Eliquis Code Status: Full code Family Communication: Discussed with patient Disposition Plan: CIR consulted, still on IV Lasix transition to oral soon     Subjective: He is breathing better, he wants to go home. Decline CIR  Objective: Vitals:   01/02/20 2048 01/02/20 2230 01/03/20 0435 01/03/20 0507  BP: (!) 103/59  126/68   Pulse: 63  65   Resp: (!) 25  (!) 26 20  Temp: 97.9 F (36.6 C)  97.7 F (36.5 C)   TempSrc: Oral  Oral   SpO2: 100% 92% 92%   Weight:      Height:        Intake/Output Summary (Last 24 hours) at 01/03/2020 5284 Last data filed at 01/02/2020 2050 Gross per 24 hour  Intake --  Output 850 ml  Net -850 ml   Filed Weights   12/29/19 1058  Weight: 90.7 kg    Examination:  General exam: NAD Respiratory system: CTA Cardiovascular system: S 1, S 2 Murmur systolic Gastrointestinal system: BS present, soft, nt Central nervous system: alert, non focal.  Extremities: Right lower extremity status post amputation Skin: no rashe   Data Reviewed: I have personally reviewed following labs and imaging studies  CBC: Recent Labs  Lab 12/29/19  1120 12/30/19 0243 12/31/19 0139 01/01/20 0114 01/02/20 0952  WBC 15.2* 12.5* 11.1* 11.4* 11.7*  HGB 11.0* 10.6* 11.5* 11.4* 11.8*  HCT 34.5* 33.4* 36.3* 35.4* 36.9*  MCV 93.8 94.1 91.2 90.5 90.7  PLT 257 343 384 373 893   Basic Metabolic Panel: Recent Labs  Lab 12/31/19 0815 01/01/20 0114 01/02/20 0247 01/02/20 0658 01/02/20 0952  NA 136 136 162* 132* 133*  K 5.2* 4.2 4.7 4.2 4.0   CL 102 99 96* 96* 96*  CO2 22 23 25 24 25   GLUCOSE 261* 269* 147* 292* 270*  BUN 55* 55* 39* 60* 59*  CREATININE 2.10* 1.88* 1.57* 1.97* 1.98*  CALCIUM 8.7* 8.5* 7.0* 7.9* 8.3*  MG 2.3  --   --   --   --    GFR: Estimated Creatinine Clearance: 27.6 mL/min (A) (by C-G formula based on SCr of 1.98 mg/dL (H)). Liver Function Tests: Recent Labs  Lab 12/29/19 1120 12/31/19 0815  AST 74* 32  ALT 65* 17  ALKPHOS 81 85  BILITOT 2.3* 1.2  PROT 6.6 6.4*  ALBUMIN 2.7* 2.5*   No results for input(s): LIPASE, AMYLASE in the last 168 hours. No results for input(s): AMMONIA in the last 168 hours. Coagulation Profile: Recent Labs  Lab 12/29/19 1217  INR 1.2   Cardiac Enzymes: No results for input(s): CKTOTAL, CKMB, CKMBINDEX, TROPONINI in the last 168 hours. BNP (last 3 results) No results for input(s): PROBNP in the last 8760 hours. HbA1C: No results for input(s): HGBA1C in the last 72 hours. CBG: Recent Labs  Lab 01/02/20 0610 01/02/20 1115 01/02/20 1623 01/02/20 2203 01/03/20 0627  GLUCAP 298* 300* 244* 222* 195*   Lipid Profile: No results for input(s): CHOL, HDL, LDLCALC, TRIG, CHOLHDL, LDLDIRECT in the last 72 hours. Thyroid Function Tests: No results for input(s): TSH, T4TOTAL, FREET4, T3FREE, THYROIDAB in the last 72 hours. Anemia Panel: No results for input(s): VITAMINB12, FOLATE, FERRITIN, TIBC, IRON, RETICCTPCT in the last 72 hours. Sepsis Labs: No results for input(s): PROCALCITON, LATICACIDVEN in the last 168 hours.  Recent Results (from the past 240 hour(s))  SARS CORONAVIRUS 2 (TAT 6-24 HRS) Nasopharyngeal Nasopharyngeal Swab     Status: None   Collection Time: 12/28/19  2:46 PM   Specimen: Nasopharyngeal Swab  Result Value Ref Range Status   SARS Coronavirus 2 NEGATIVE NEGATIVE Final    Comment: (NOTE) SARS-CoV-2 target nucleic acids are NOT DETECTED. The SARS-CoV-2 RNA is generally detectable in upper and lower respiratory specimens during the acute  phase of infection. Negative results do not preclude SARS-CoV-2 infection, do not rule out co-infections with other pathogens, and should not be used as the sole basis for treatment or other patient management decisions. Negative results must be combined with clinical observations, patient history, and epidemiological information. The expected result is Negative. Fact Sheet for Patients: SugarRoll.be Fact Sheet for Healthcare Providers: https://www.woods-mathews.com/ This test is not yet approved or cleared by the [place] FDA and  has been authorized for detection and/or diagnosis of SARS-CoV-2 by FDA under an Emergency Use Authorization (EUA). This EUA will remain  in effect (meaning this test can be used) for the duration of the COVID-19 declaration under Section 56 4(b)(1) of the Act, 21 U.S.C. section 360bbb-3(b)(1), unless the authorization is terminated or revoked sooner. Performed at [facility], [place] [address] [place], [place] [address]          Radiology Studies: No results found.      Scheduled Meds: . [  START ON 01/05/2020] amiodarone  200 mg Oral Daily  . amiodarone  400 mg Oral BID  . apixaban  2.5 mg Oral BID  . carvedilol  3.125 mg Oral BID WC  . docusate sodium  100 mg Oral Daily  . feeding supplement  1 Container Oral BID BM  . furosemide  40 mg Intravenous Daily  . insulin aspart  0-15 Units Subcutaneous Q6H  . insulin glargine  6 Units Subcutaneous Daily  . pantoprazole  40 mg Oral Daily  . polyethylene glycol  17 g Oral Daily  . senna  1 tablet Oral Daily   Continuous Infusions: . magnesium sulfate bolus IVPB       LOS: 5 days    Time spent: 35 minutes    Shyah Cadmus A Chloe Miyoshi, MD [facility]   If 7PM-7AM, please contact night-coverage [email or web address] Password [facility] 01/03/2020, 7:26 AM

## 2020-01-03 NOTE — Progress Notes (Addendum)
[facility]  Subjective  - Doing much better and wants to go home.   Objective 126/68 65 97.7 F (36.5 C) (Oral) 20 92%  Intake/Output Summary (Last 24 hours) at 01/03/2020 6283 Last data filed at 01/02/2020 2050 Gross per 24 hour  Intake --  Output 850 ml  Net -850 ml    Right AKA stump healing well, staples maintained Lungs non labored breathing O2 2L SAT's 92 Heart RRR   Assessment/Planning: POD # 4 right AKA  Plan for home with [facility], hospital bed, PT F/U with Dr. Donzetta Matters in 3-4 weeks for staple removal Cardiology  Plan for Eliquis 2.5 BID and oral amiodarone,  BUN/creatinine were elevated and Lasix was decreased to 40 mg daily.  Considering PO lasix for discharge.   No Ace/ARB 2/2 renal insufficiency   [name] 01/03/2020 7:21 AM --  Laboratory Lab Results: Recent Labs    01/01/20 0114 01/02/20 0952  WBC 11.4* 11.7*  HGB 11.4* 11.8*  HCT 35.4* 36.9*  PLT 373 360   BMET Recent Labs    01/02/20 0658 01/02/20 0952  NA 132* 133*  K 4.2 4.0  CL 96* 96*  CO2 24 25  GLUCOSE 292* 270*  BUN 60* 59*  CREATININE 1.97* 1.98*  CALCIUM 7.9* 8.3*    COAG Lab Results  Component Value Date   INR 1.2 12/29/2019   INR 1.14 05/26/2015   No results found for: PTT  I have independently interviewed and examined patient and agree with PA assessment and plan above.   Anitria Andon C. Donzetta Matters, MD [facility] Office: [phone] Pager: [phone]

## 2020-01-03 NOTE — TOC Initial Note (Addendum)
Transition of Care (TOC) - Initial/Assessment Note  Marvetta Gibbons RN, BSN Transitions of Care Unit 4E- RN Case Manager [phone]   Patient Details  Name: Henry Wiggins MRN: [id number] Date of Birth: [birth year]  Transition of Care [facility]) CM/SW Contact:    Dawayne Patricia, RN Phone Number: 01/03/2020, 1:47 PM  Clinical Narrative:                 Notified by Urban Gibson with CIR that daughter has declined CIR admission and would like to take pt home with [facility]- order placed for HHPT and DME- hospital bed- call made to daughter [name]- discussed transition of care plan- confirmed plan to take pt home with [facility] services- per daughter she would like to use [facility] for [facility] needs- she is also requesting 3n1 and wheelchair will call attending team for additional DME orders.- Address, phone # and PCP all confirmed in epic with daughter. Daughter states they will plan to transport via private car.  Call made to Hessie Diener for DME orders, Call also made to [place] with [facility] for [facility] referral however [facility] is OON with pt's insurance. Calls also made to [facility], [facility], [employer], [facility], for DME needs and at this time no one able to deliver hospital bed- have reached back out to daughter to update on challenges and possible delay- also offered [facility] choice Per CMS guidelines from medicare.gov website with star ratings (copy placed in shadow chart)- per daughter she does not have a preference as long as agency will take insurance- call made to Butch Penny with [facility] for HHPT referral- received call return that [facility] is able to accept for [facility] needs-  CM has reached out to [place]. To assist with DME challenges for hospital bed and wheelchair needs. - will update daughter when able.   Expected Discharge Plan: [place] Barriers to Discharge: Equipment Delay   Patient Goals and CMS Choice Patient states their goals for this hospitalization and ongoing recovery are:: return home  for rehab-(per daughter) CMS Medicare.gov Compare Post Acute Care list provided to:: Patient Represenative (must comment) Choice offered to / list presented to : Adult Children(daughter - [name])  Expected Discharge Plan and Services Expected Discharge Plan: [place]   Discharge Planning Services: CM Consult Post Acute Care Choice: Durable Medical Equipment, Home Health Living arrangements for the past 2 months: [place]                 DME Arranged: 3-N-1, Hospital bed, Wheelchair manual         HH Arranged: PT HH Agency: [place] ([place]) Date [place]: 01/03/20 Time [place]: 1300 Representative spoke with at [place]: Butch Penny  Prior Living Arrangements/Services Living arrangements for the past 2 months: [place] with:: Adult Children Patient language and need for interpreter reviewed:: Yes Do you feel safe going back to the place where you live?: Yes      Need for Family Participation in Patient Care: Yes (Comment) Care giver support system in place?: Yes (comment) Current home services: DME Criminal Activity/Legal Involvement Pertinent to Current Situation/Hospitalization: No - Comment as needed  Activities of Daily Living Home Assistive Devices/Equipment: Shower chair with back ADL Screening (condition at time of admission) Patient's cognitive ability adequate to safely complete daily activities?: Yes Is the patient deaf or have difficulty hearing?: Yes Does the patient have difficulty seeing, even when wearing glasses/contacts?: No Does the  patient have difficulty concentrating, remembering, or making decisions?: Yes Patient able to express need for assistance with ADLs?: Yes Does the patient have difficulty dressing or bathing?: Yes Independently performs ADLs?: No Communication: Needs assistance Is this a change from baseline?: Pre-admission baseline Dressing (OT): Needs  assistance Is this a change from baseline?: Pre-admission baseline Grooming: Needs assistance Is this a change from baseline?: Pre-admission baseline Feeding: Independent Bathing: Needs assistance Is this a change from baseline?: Pre-admission baseline Toileting: Independent In/Out Bed: Needs assistance Is this a change from baseline?: Pre-admission baseline Walks in Home: Needs assistance Is this a change from baseline?: Pre-admission baseline Does the patient have difficulty walking or climbing stairs?: Yes Weakness of Legs: Both Weakness of Arms/Hands: Both  Permission Sought/Granted Permission sought to share information with : Customer service manager, Case Production designer, theatre/television/film granted to share info w AGENCY: HH and DME        Emotional Assessment Appearance:: Appears stated age Attitude/Demeanor/Rapport: Engaged Affect (typically observed): Appropriate Orientation: : Oriented to Self, Oriented to Place, Oriented to  Time, Oriented to Situation   Psych Involvement: No (comment)  Admission diagnosis:  PAD (peripheral artery disease) (HCC) [I73.9] S/P AKA (above knee amputation) ([place]) [Z89.619] Patient Active Problem List   Diagnosis Date Noted  . Paroxysmal atrial fibrillation (HCC)   . PAD (peripheral artery disease) ([place]) 12/29/2019  . S/P AKA (above knee amputation) ([place]) 12/29/2019  . Encounter for loop recorder check 09/05/2015  . Stroke ([place]) 05/27/2015  . CVA (cerebral infarction) 05/26/2015  . Diabetes ([place]) 05/26/2015  . Hypertension 05/26/2015  . Hyperlipidemia 05/26/2015  . Acute renal failure ([place]) 05/26/2015   PCP:  Antony Contras, MD Pharmacy:   [id number] [place]. AT [address] [place] [address] Phone: [phone] Fax: [phone]  [facility] [address], [place] - [place] [address] [place] [address] Phone:  [phone] Fax: [phone]  [facility] of [place], [place] [address] [place] [address] Phone: [phone] Fax: [phone]     Social Determinants of Health (SDOH) Interventions    Readmission Risk Interventions Readmission Risk Prevention Plan 01/03/2020  Post Dischage Appt Complete  Medication Screening Complete  Transportation Screening Complete  Some recent data might be hidden

## 2020-01-03 NOTE — Progress Notes (Cosign Needed)
   Durable Medical Equipment (From admission, onward)       Start     Ordered  01/03/20 1118   For home use only DME 3 n 1  Once    01/03/20 1119  01/03/20 1118   For home use only DME lightweight manual wheelchair with seat cushion  Once   Comments: Patient suffers from right leg AKA which impairs their ability to perform daily activities like bathing, dressing and grooming in the home.  A cane, crutch or walker will not resolve  issue with performing activities of daily living. A wheelchair will allow patient to safely perform daily activities. Patient is not able to propel themselves in the home using a standard weight wheelchair due to arm weakness, endurance and general weakness. Patient can self propel in the lightweight wheelchair. Length of need Lifetime. Accessories: elevating leg rests (ELRs), wheel locks, extensions and anti-tippers.  01/03/20 1119  01/03/20 0719   For home use only DME Hospital bed  Once   Question Answer Comment Length of Need 6 Months  Patient has (list medical condition): Right AKA  The above medical condition requires: Patient requires the ability to reposition frequently  Bed type Semi-electric  Support Surface: Gel Overlay    01/03/20 0720

## 2020-01-04 LAB — CBC
HCT: 37.3 % — ABNORMAL LOW (ref 39.0–52.0)
Hemoglobin: 11.9 g/dL — ABNORMAL LOW (ref 13.0–17.0)
MCH: 28.7 pg (ref 26.0–34.0)
MCHC: 31.9 g/dL (ref 30.0–36.0)
MCV: 90.1 fL (ref 80.0–100.0)
Platelets: 439 10*3/uL — ABNORMAL HIGH (ref 150–400)
RBC: 4.14 MIL/uL — ABNORMAL LOW (ref 4.22–5.81)
RDW: 13.5 % (ref 11.5–15.5)
WBC: 8.5 10*3/uL (ref 4.0–10.5)
nRBC: 0 % (ref 0.0–0.2)

## 2020-01-04 LAB — BASIC METABOLIC PANEL
Anion gap: 12 (ref 5–15)
BUN: 61 mg/dL — ABNORMAL HIGH (ref 8–23)
CO2: 27 mmol/L (ref 22–32)
Calcium: 8.5 mg/dL — ABNORMAL LOW (ref 8.9–10.3)
Chloride: 95 mmol/L — ABNORMAL LOW (ref 98–111)
Creatinine, Ser: 1.99 mg/dL — ABNORMAL HIGH (ref 0.61–1.24)
GFR calc Af Amer: 33 mL/min — ABNORMAL LOW (ref >60)
GFR calc non Af Amer: 29 mL/min — ABNORMAL LOW (ref >60)
Glucose, Bld: 209 mg/dL — ABNORMAL HIGH (ref 70–99)
Potassium: 3.9 mmol/L (ref 3.5–5.1)
Sodium: 134 mmol/L — ABNORMAL LOW (ref 135–145)

## 2020-01-04 LAB — SURGICAL PATHOLOGY

## 2020-01-04 LAB — GLUCOSE, CAPILLARY
Glucose-Capillary: 201 mg/dL — ABNORMAL HIGH (ref 70–99)
Glucose-Capillary: 218 mg/dL — ABNORMAL HIGH (ref 70–99)

## 2020-01-04 MED ORDER — BLOOD GLUCOSE METER KIT: PACK | 0 refills | Status: AC

## 2020-01-04 MED ORDER — DOCUSATE SODIUM 100 MG PO CAPS: 100.0000 mg | ORAL_CAPSULE | Freq: Every day | ORAL | 0 refills | Status: DC

## 2020-01-04 MED ORDER — INSULIN GLARGINE 100 UNIT/ML ~~LOC~~ SOLN
15.0000 [IU] | Freq: Every day | SUBCUTANEOUS | Status: DC
  Administered 2020-01-04: 11:00:00 15 [IU] via SUBCUTANEOUS
  Filled 2020-01-04: qty 0.15

## 2020-01-04 MED ORDER — SENNA 8.6 MG PO TABS: 1.0000 | ORAL_TABLET | Freq: Every day | ORAL | 0 refills | Status: DC

## 2020-01-04 MED ORDER — CARVEDILOL 3.125 MG PO TABS: 3.1250 mg | ORAL_TABLET | Freq: Two times a day (BID) | ORAL | 0 refills | Status: DC

## 2020-01-04 MED ORDER — INSULIN ASPART 100 UNIT/ML ~~LOC~~ SOLN: 3.0000 [IU] | Freq: Three times a day (TID) | SUBCUTANEOUS | 11 refills | Status: DC

## 2020-01-04 MED ORDER — LEVEMIR FLEXTOUCH 100 UNIT/ML ~~LOC~~ SOPN: 15.0000 [IU] | PEN_INJECTOR | Freq: Every day | SUBCUTANEOUS | 11 refills | Status: DC

## 2020-01-04 MED ORDER — APIXABAN 2.5 MG PO TABS: 2.5000 mg | ORAL_TABLET | Freq: Two times a day (BID) | ORAL | 2 refills | Status: DC

## 2020-01-04 MED ORDER — INSULIN ASPART 100 UNIT/ML ~~LOC~~ SOLN: 3.0000 [IU] | Freq: Three times a day (TID) | SUBCUTANEOUS | Status: DC

## 2020-01-04 MED ORDER — INSULIN GLARGINE 100 UNITS/ML SOLOSTAR PEN: 15.0000 [IU] | PEN_INJECTOR | Freq: Every day | SUBCUTANEOUS | 11 refills | Status: DC

## 2020-01-04 MED ORDER — AMIODARONE HCL 200 MG PO TABS: 200.0000 mg | ORAL_TABLET | Freq: Every day | ORAL | 0 refills | Status: DC

## 2020-01-04 MED ORDER — AMIODARONE HCL 400 MG PO TABS: 400.0000 mg | ORAL_TABLET | Freq: Two times a day (BID) | ORAL | 0 refills | Status: DC

## 2020-01-04 MED ORDER — "BD INSULIN SYRINGE 29G X 1/2"" 0.3 ML MISC": 1.0000 "application " | Freq: Every day | 0 refills | Status: DC

## 2020-01-04 MED ORDER — FUROSEMIDE 40 MG PO TABS: 40.0000 mg | ORAL_TABLET | Freq: Every day | ORAL | 0 refills | Status: DC

## 2020-01-04 MED ORDER — TRAMADOL HCL 50 MG PO TABS: 50.0000 mg | ORAL_TABLET | Freq: Four times a day (QID) | ORAL | 0 refills | Status: DC | PRN

## 2020-01-04 MED ORDER — LINAGLIPTIN 5 MG PO TABS: 5.0000 mg | ORAL_TABLET | Freq: Every day | ORAL | 0 refills | Status: AC

## 2020-01-04 MED FILL — TRADJENTA 5 MG TABLET: 5 | 30 days supply | Qty: 30 | Fill #0

## 2020-01-04 MED FILL — SENNA 8.6 MG TABS: 8.6 | 120 days supply | Qty: 120 | Fill #0

## 2020-01-04 MED FILL — DOK 100 MG CAPS: 100 | 10 days supply | Qty: 10 | Fill #0

## 2020-01-04 MED FILL — traMADol HCL 50 MG TABS: 50 | 5 days supply | Qty: 20 | Fill #0

## 2020-01-04 MED FILL — AMIODARONE HCL 200 MG TAB: 200 | 30 days supply | Qty: 35 | Fill #0

## 2020-01-04 MED FILL — FUROSEMIDE 40 MG TABLET: 40 | 30 days supply | Qty: 30 | Fill #0

## 2020-01-04 MED FILL — ELIQUIS 2.5 MG TABLET: 2.5 | 30 days supply | Qty: 60 | Fill #0

## 2020-01-04 MED FILL — CARVEDILOL 3.125 MG TABLET: 3.125 | 30 days supply | Qty: 60 | Fill #0

## 2020-01-04 NOTE — Progress Notes (Signed)
Physical Therapy Treatment Patient Details Name: Henry Wiggins MRN: [id number] DOB: [birth year] Today's Date: 01/04/2020    History of Present Illness Patient is a 86 y/o male who presents s/p right AKA 12/29/19. PMH includes CVA, HTN, HLD, memory loss, DM, depression, bladder ca.    PT Comments    Pt making steady progress towards PT goals this session. Session focus on functional transfer training from EOB>recliner. Pt required Mod A +2 with RW needing cues for heel/ toe pattern to pivot to recliner. Pt with poor safety awareness noted to sit prematurely needing assist to guide hips into recliner. Pt declined further transfer training but verbally educated pt on lateral scoot transfers at home from bed > W/c. Also, issued pt gait belt to assist with safe transfers at home. Therapist provided amputee education this session regarding edema management, future prosthetic training, and R LE therex. Asked RN to notify PT if family is available later today for hands on education. Feel pt would greatly benefit from CIR placement however, family declining placement. If family decides to take pt home would need DME listed below and 24 hour assist. Will continue to follow acutely per POC.    Follow Up Recommendations  CIR;Supervision for mobility/OOB;Supervision/Assistance - 24 hour     Equipment Recommendations  Rolling walker with 5" wheels;Wheelchair cushion (measurements PT);Wheelchair (measurements PT)(20 x 18)    Recommendations for Other Services       Precautions / Restrictions Precautions Precautions: Fall;Other (comment) Precaution Comments: watch 02 Restrictions Weight Bearing Restrictions: Yes RLE Weight Bearing: Non weight bearing    Mobility  Bed Mobility Overal bed mobility: Needs Assistance Bed Mobility: Supine to Sit     Supine to sit: Mod assist;HOB elevated     General bed mobility comments: MOD A to scoot hips to EOB with HOB elevated  Transfers Overall transfer  level: Needs assistance Equipment used: Rolling walker (2 wheeled) Transfers: Sit to/from [facility] Sit to Stand: Mod assist;+2 safety/equipment;From elevated surface Stand pivot transfers: Mod assist;+2 physical assistance;+2 safety/equipment;Max assist       General transfer comment: MODA +2 for sit>stand from elevated EOB, cues for hand placement and safety throughout. MAX- MOD A +2 for stand pivot transfer from EOB>recliner as pt with poor safety awareness noted to prematurely sit in recliner  Ambulation/Gait                 Stairs             Wheelchair Mobility    Modified Rankin (Stroke Patients Only)       Balance Overall balance assessment: Needs assistance Sitting-balance support: Bilateral upper extremity supported Sitting balance-Leahy Scale: Fair Sitting balance - Comments: Supervision for safety   Standing balance support: Bilateral upper extremity supported Standing balance-Leahy Scale: Poor Standing balance comment: reliant on BUE support and external assist                            Cognition Arousal/Alertness: Awake/alert Behavior During Therapy: WFL for tasks assessed/performed Overall Cognitive Status: History of cognitive impairments - at baseline Area of Impairment: Attention;Memory;Following commands;Safety/judgement;Problem solving                   Current Attention Level: Focused Memory: Decreased short-term memory Following Commands: Follows one step commands with increased time Safety/Judgement: Decreased awareness of safety   Problem Solving: Slow processing;Decreased initiation;Difficulty sequencing;Requires verbal cues;Requires tactile cues General Comments: pt slow to process needing  increased time and frequent cues for safety as pt noted to sit prematurely in recliner.      Exercises      General Comments        Pertinent Vitals/Pain Pain Assessment: Faces Faces Pain Scale:  Hurts a little bit Pain Location: right residual limb with transfers Pain Descriptors / Indicators: Discomfort Pain Intervention(s): Monitored during session;Repositioned    Home Living                      Prior Function            PT Goals (current goals can now be found in the care plan section) Acute Rehab PT Goals Patient Stated Goal: "see my daughter" Progress towards PT goals: Progressing toward goals    Frequency    Min 3X/week      PT Plan Current plan remains appropriate    Co-evaluation   Reason for Co-Treatment: Complexity of the patient's impairments (multi-system involvement);To address functional/ADL transfers PT goals addressed during session: Mobility/safety with mobility        AM-PAC PT "6 Clicks" Mobility   Outcome Measure  Help needed turning from your back to your side while in a flat bed without using bedrails?: A Lot Help needed moving from lying on your back to sitting on the side of a flat bed without using bedrails?: A Lot Help needed moving to and from a bed to a chair (including a wheelchair)?: A Lot Help needed standing up from a chair using your arms (e.g., wheelchair or bedside chair)?: A Lot Help needed to walk in hospital room?: Total Help needed climbing 3-5 steps with a railing? : Total 6 Click Score: 10    End of Session Equipment Utilized During Treatment: Gait belt Activity Tolerance: Patient tolerated treatment well Patient left: in chair;with call bell/phone within reach Nurse Communication: Mobility status PT Visit Diagnosis: Pain;Muscle weakness (generalized) (M62.81) Pain - Right/Left: Right Pain - part of body: Leg     Time: 7096-2836 PT Time Calculation (min) (ACUTE ONLY): 23 min  Charges:  $Therapeutic Activity: 8-22 mins                     Netta Corrigan, PT, DPT, CSRS Acute Rehab Office [place] 01/04/2020, 4:10 PM

## 2020-01-04 NOTE — Progress Notes (Signed)
Occupational Therapy Treatment Patient Details Name: Henry Wiggins MRN: [id number] DOB: 06-Oct-1927 Today's Date: 01/04/2020    History of present illness Patient is a 86 y/o male who presents s/p right AKA 12/29/19. PMH includes CVA, HTN, HLD, memory loss, DM, depression, bladder ca.   OT comments  Pt making steady progress towards OT goals this session. Session focus on functional transfer training from EOB>recliner. Pt required MOD- MAX A +2 with RW needing cues for heel/ toe pattern to pivot to recliner. Pt with poor safety awareness noted to sit prematurely needing assist to guide hips into recliner. Pt declined further transfer training but verbally educated pt on lateral scoot transfers at home from bed > W/c. Also, issued pt gait belt to assist with safe transfers at home. Feel pt would greatly benefit from CIR placement however, family declining placement. If family decides to take pt home would need DME listed below and 24 hour assist. Will continue to follow acutely per POC.    Follow Up Recommendations  Supervision/Assistance - 24 hour;CIR    Equipment Recommendations  3 in 1 bedside commode    Recommendations for Other Services      Precautions / Restrictions Precautions Precautions: Fall;Other (comment) Precaution Comments: watch 02 Restrictions Weight Bearing Restrictions: Yes RLE Weight Bearing: Non weight bearing       Mobility Bed Mobility Overal bed mobility: Needs Assistance Bed Mobility: Supine to Sit     Supine to sit: Mod assist;HOB elevated     General bed mobility comments: MOD A to scoot hips to EOB with HOB elevated  Transfers Overall transfer level: Needs assistance Equipment used: Rolling walker (2 wheeled) Transfers: Sit to/from [facility] Sit to Stand: Mod assist;+2 safety/equipment;From elevated surface Stand pivot transfers: Mod assist;+2 physical assistance;+2 safety/equipment;Max assist       General transfer comment:  MODA +2 for sit>stand from elevated EOB, cues for hand placement and safety throughout. MAX- MOD A +2 for stand pivot transfer from EOB>recliner as pt with poor safety awareness noted to prematurely sit in recliner    Balance Overall balance assessment: Needs assistance Sitting-balance support: Bilateral upper extremity supported Sitting balance-Leahy Scale: Fair Sitting balance - Comments: Supervision for safety   Standing balance support: Bilateral upper extremity supported Standing balance-Leahy Scale: Poor Standing balance comment: reliant on BUE support and external assist                           ADL either performed or assessed with clinical judgement   ADL Overall ADL's : Needs assistance/impaired                     Lower Body Dressing: Total assistance;Sitting/lateral leans Lower Body Dressing Details (indicate cue type and reason): reports he can't bend over, total A to don socks from EOB Toilet Transfer: Maximal assistance;+2 for safety/equipment;+2 for physical assistance;RW;Stand-pivot;Moderate assistance Toilet Transfer Details (indicate cue type and reason): simulated to recliner with RW and MAX A +2. cues for safety as pt sitting in recliner prematurely. Cues needed for heel/ toe pattern to pivot to recliner. pt declined further transfer training         Functional mobility during ADLs: Maximal assistance;+2 for physical assistance;+2 for safety/equipment;Cueing for safety;Cueing for sequencing General ADL Comments: session focus on transfer training via stand pivot from EOB>recliner with RW and MAX A- MOD A +2. pt limited by cognitive deficits and generalized weakness     Vision Patient  Visual Report: No change from baseline     Perception     Praxis      Cognition Arousal/Alertness: Awake/alert Behavior During Therapy: WFL for tasks assessed/performed Overall Cognitive Status: History of cognitive impairments - at baseline Area of  Impairment: Attention;Memory;Following commands;Safety/judgement;Problem solving                   Current Attention Level: Focused Memory: Decreased short-term memory Following Commands: Follows one step commands with increased time Safety/Judgement: Decreased awareness of safety   Problem Solving: Slow processing;Decreased initiation;Difficulty sequencing;Requires verbal cues;Requires tactile cues General Comments: pt slow to process needing increased time and frequent cues for safety as pt noted to sit prematurely in recliner.        Exercises     Shoulder Instructions       General Comments      Pertinent Vitals/ Pain       Pain Assessment: Faces Faces Pain Scale: Hurts a little bit Pain Location: right residual limb with transfers Pain Descriptors / Indicators: Discomfort Pain Intervention(s): Monitored during session;Repositioned  Home Living                                          Prior Functioning/Environment              Frequency  Min 2X/week        Progress Toward Goals  OT Goals(current goals can now be found in the care plan section)  Progress towards OT goals: Progressing toward goals  Acute Rehab OT Goals Patient Stated Goal: "see my daughter" OT Goal Formulation: With patient Time For Goal Achievement: 01/13/20 Potential to Achieve Goals: Good  Plan Discharge plan remains appropriate    Co-evaluation                 AM-PAC OT "6 Clicks" Daily Activity     Outcome Measure   Help from another person eating meals?: None Help from another person taking care of personal grooming?: A Little Help from another person toileting, which includes using toliet, bedpan, or urinal?: A Lot Help from another person bathing (including washing, rinsing, drying)?: A Lot Help from another person to put on and taking off regular upper body clothing?: A Lot Help from another person to put on and taking off regular lower body  clothing?: A Lot 6 Click Score: 15    End of Session Equipment Utilized During Treatment: Gait belt;Rolling walker  OT Visit Diagnosis: Other abnormalities of gait and mobility (R26.89);Muscle weakness (generalized) (M62.81);Other symptoms and signs involving cognitive function;Pain Pain - Right/Left: Right Pain - part of body: Leg   Activity Tolerance Patient tolerated treatment well   Patient Left in chair;with call bell/phone within reach   Nurse Communication Mobility status        Time: 0488-8916 OT Time Calculation (min): 23 min  Charges: OT General Charges $OT Visit: 1 Visit OT Treatments $Therapeutic Activity: 8-22 mins  Lanier Clam., COTA/L Acute Rehabilitation Services [phone]    Henry Wiggins 01/04/2020, 3:10 PM

## 2020-01-04 NOTE — Progress Notes (Addendum)
Inpatient Diabetes Program Recommendations  AACE/ADA: New Consensus Statement on Inpatient Glycemic Control   Target Ranges:  Prepandial:   less than 140 mg/dL      Peak postprandial:   less than 180 mg/dL (1-2 hours)      Critically ill patients:  140 - 180 mg/dL   Results for Henry Wiggins, Henry Wiggins (MRN [id number]) as of 01/04/2020 08:23  Ref. Range 01/03/2020 06:27 01/03/2020 11:32 01/03/2020 16:48 01/03/2020 21:21 01/04/2020 06:24  Glucose-Capillary Latest Ref Range: 70 - 99 mg/dL 195 (H) 315 (H) 203 (H) 155 (H) 201 (H)   Review of Glycemic Control  Diabetes history: DM2 Outpatient Diabetes medications: Metformin 1000 mg BID Current orders for Inpatient glycemic control: Lantus 12 units daily, Novolog 0-15 units Q6H  Inpatient Diabetes Program Recommendations:     Insulin-Basal: Please consider increasing Lantus to 15 units daily.  Insulin-Correction: If patient is eating well, please consider changing Novolog correction to 0-15 units TID with meals and Novolog 0-5 units QHS.  Insulin-Meal Coverage: If patient is eating, please consider ordering Novolog 3 units TID with meals for meal coverage if patient eats at least 50% of meals.  Addendum 01/04/20-Received page from RN regarding discharge plan to send patient home on insulin. Called patient over the phone but he had a very difficult time hearing me. He reported that his daughter will be helping him at home and to call her. Called [name] (patient's daughter) and spoke with her over the phone. She reports that she has been living with her father for about 1 year since her mother passed away.  She reports that her father eats whatever he wants to and does not drink any water. She also reports that he would not take his Metformin but once a day at home (in the morning) and would not take the evening dose for her. Patient's daughter states that her father does not check glucose at home currently and he has a glucometer and test strips somewhere in the  house that is several years old. Will ask MD for prescription for a new glucometer and testing supplies. Discussed using insulin at home for glycemic control. Patient's daughter would prefer for patient to be discharged on an oral DM medication if at all possible. She plans to make changes with his diet as he has a lot of juice and high carb foods so she plans to limit those and will encourage patient to drink more water at home.  Informed patient's daughter that I would talk with Dr. Tyrell Antonio to ask about using an oral DM medication as a outpatient and call her back.  Discussed option of using oral DM med versus insulin as an outpatient with Dr. Tyrell Antonio. Would recommend using Tradjenta 5 mg daily as an oral DM medication and Dr. Tyrell Antonio agreeable to allow patient to try oral DM medication at home. Called patient's daughter back to let her know that MD will allow patient to be discharged on Tradjenta. Discussed Tradjenta and how it works for DM control. Asked her to check patient's glucose 2-3 times a day and to keep a record of the readings. Asked that if glucose is consistently staying over 200 mg/dl she needs to call patient's PCP as he may need to use insulin. Discussed importance of glycemic control following surgery to decrease risk of complications and promote wound healing. Reviewed glucose goals. Patient's daughter verbalized understanding of information discussed and states that she has no questions at this time.  Thanks, Barnie Alderman, RN, MSN, CDE Diabetes  Coordinator Inpatient Diabetes Program [phone] (Team Pager from 8am to [place])

## 2020-01-04 NOTE — Progress Notes (Addendum)
[facility]  Subjective  - Doing well over all, becoming confused at times.   Objective (!) 142/69 63 97.7 F (36.5 C) (Oral) (!) 22 98%  Intake/Output Summary (Last 24 hours) at 01/04/2020 0715 Last data filed at 01/03/2020 2120 Gross per 24 hour  Intake --  Output 775 ml  Net -775 ml    Right AKA incision healing well staples intact. Lungs non labored breathing on 2 L of O2 98%. Heart RRR   Assessment/Planning: POD # 5 right AKA A fib now converted to NS on Amiodarone 200 mg daily starting 01/04/19 and Eliquis 2.5 BID.  Low dose Coreg 3.125 BID.  ARB on hold.  Lasix 40 mg daily.    SSI scale for hyperglycemia Metformin on hold for now Cr 1.98 lantus to 12 units.  Possible Novolog at discharge.   SAT's 98% on 2 L will try to wean off O2.  Pending AM labs   Pending discharge once stable from a cardiac and IM point of view with clear plan on DM control and dose of insulin verses restarting Metformin pending Cr. And final Cardiology medication changes.  [name] 01/04/2020 7:15 AM --  Laboratory Lab Results: Recent Labs    01/02/20 0952  WBC 11.7*  HGB 11.8*  HCT 36.9*  PLT 360   BMET Recent Labs    01/02/20 0952 01/03/20 0813  NA 133* 135  K 4.0 4.0  CL 96* 97*  CO2 25 27  GLUCOSE 270* 196*  BUN 59* 61*  CREATININE 1.98* 1.98*  CALCIUM 8.3* 8.3*    COAG Lab Results  Component Value Date   INR 1.2 12/29/2019   INR 1.14 05/26/2015   No results found for: PTT  I have independently interviewed and examined patient and agree with PA assessment and plan above.   Loys Hoselton C. Donzetta Matters, MD [facility] Office: [phone] Pager: [phone]

## 2020-01-04 NOTE — Progress Notes (Addendum)
Progress Note  Patient Name: Henry Wiggins Date of Encounter: 01/04/2020  Primary Cardiologist: Sanda Klein, MD   Subjective   Denies SOB or CP. On 1 L O2. Ready to go home.   Inpatient Medications    Scheduled Meds: . [START ON 01/05/2020] amiodarone  200 mg Oral Daily  . amiodarone  400 mg Oral BID  . apixaban  2.5 mg Oral BID  . carvedilol  3.125 mg Oral BID WC  . docusate sodium  100 mg Oral Daily  . feeding supplement  1 Container Oral BID BM  . furosemide  40 mg Oral Daily  . insulin aspart  0-15 Units Subcutaneous Q6H  . insulin aspart  3 Units Subcutaneous TID WC  . insulin glargine  15 Units Subcutaneous Daily  . pantoprazole  40 mg Oral Daily  . polyethylene glycol  17 g Oral Daily  . senna  1 tablet Oral Daily   Continuous Infusions: . magnesium sulfate bolus IVPB     PRN Meds: acetaminophen **OR** acetaminophen, alum & mag hydroxide-simeth, guaiFENesin-dextromethorphan, hydrALAZINE, labetalol, magnesium sulfate bolus IVPB, morphine injection, ondansetron, phenol, potassium chloride   Vital Signs    Vitals:   01/03/20 2119 01/04/20 0626 01/04/20 0814 01/04/20 0821  BP: 123/66 (!) 142/69 (!) 144/66   Pulse: 67 63    Resp: (!) 25 (!) 22 18   Temp: (!) 97.5 F (36.4 C) 97.7 F (36.5 C)    TempSrc: Oral Oral    SpO2: 98% 98%  93%  Weight:      Height:        Intake/Output Summary (Last 24 hours) at 01/04/2020 0842 Last data filed at 01/04/2020 0815 Gross per 24 hour  Intake --  Output 875 ml  Net -875 ml   Last 3 Weights 12/29/2019 12/28/2019 12/19/2019  Weight (lbs) 200 lb 200 lb 200 lb  Weight (kg) 90.719 kg 90.719 kg 90.719 kg      Telemetry    NSR, Hr 60-70s - Personally Reviewed  ECG    No new - Personally Reviewed  Physical Exam   GEN: No acute distress.   Neck: No JVD Cardiac: RRR, no murmurs, rubs, or gallops.  Respiratory: Clear to auscultation bilaterally; 1 L O2 GI: Soft, nontender, non-distended  MS: No edema; R  BKA Neuro:  Nonfocal  Psych: Normal affect   Labs    High Sensitivity Troponin:   Recent Labs  Lab 12/31/19 0815 12/31/19 1129 12/31/19 1420 12/31/19 1658  TROPONINIHS 293* 552* 596* 650*      Chemistry Recent Labs  Lab 12/29/19 1120 12/31/19 0815 01/02/20 0658 01/02/20 0952 01/03/20 0813  NA 139 136 132* 133* 135  K 5.7* 5.2* 4.2 4.0 4.0  CL 105 102 96* 96* 97*  CO2 22 22 24 25 27   GLUCOSE 208* 261* 292* 270* 196*  BUN 48* 55* 60* 59* 61*  CREATININE 2.02* 2.10* 1.97* 1.98* 1.98*  CALCIUM 9.0 8.7* 7.9* 8.3* 8.3*  PROT 6.6 6.4*  --   --   --   ALBUMIN 2.7* 2.5*  --   --   --   AST 74* 32  --   --   --   ALT 65* 17  --   --   --   ALKPHOS 81 85  --   --   --   BILITOT 2.3* 1.2  --   --   --   GFRNONAA 28* 27* 29* 29* 29*  GFRAA 33* 31* 34* 33* 33*  ANIONGAP 12 12 12 12 11      Hematology Recent Labs  Lab 12/31/19 0139 01/01/20 0114 01/02/20 0952  WBC 11.1* 11.4* 11.7*  RBC 3.98* 3.91* 4.07*  HGB 11.5* 11.4* 11.8*  HCT 36.3* 35.4* 36.9*  MCV 91.2 90.5 90.7  MCH 28.9 29.2 29.0  MCHC 31.7 32.2 32.0  RDW 13.7 14.1 13.8  PLT 384 373 360    BNP Recent Labs  Lab 12/31/19 0139  BNP 1,620.0*     DDimer No results for input(s): DDIMER in the last 168 hours.   Radiology    No results found.  Cardiac Studies    Echo 12/31/19 1. Left ventricular ejection fraction, by visual estimation, is 30 to 35%. The left ventricle has moderate to severely decreased function. There is mildly increased left ventricular hypertrophy. 2. Left ventricular diastolic parameters are consistent with Grade II diastolic dysfunction (pseudonormalization). 3. The left ventricle demonstrates regional wall motion abnormalities. 4. Global right ventricle has normal systolic function.The right ventricular size is normal. 5. Left atrial size was mildly dilated. 6. Right atrial size was normal. 7. The mitral valve is normal in structure. Mild mitral valve regurgitation. No  evidence of mitral stenosis. 8. The tricuspid valve is normal in structure. 9. The aortic valve is tricuspid. Aortic valve regurgitation is trivial. Mild aortic valve sclerosis without stenosis. 10. The pulmonic valve was normal in structure. Pulmonic valve regurgitation is not visualized. 11. The inferior vena cava is dilated in size with >50% respiratory variability, suggesting right atrial pressure of 8 mmHg. 12. Severe hypokinesis of the distal septum and apex; overall moderate to severe LV dysfunction; grade 2 diastolic dsyfunction; mild LVH; mild LAE; mild MR.   Patient Profile     86 y.o. male with a hx of DM2, HTN, hyperlipidemia, prior CVA, peripheral vascular disease and now s/p AKA who is being seen for the evaluation of post-op afib.  Assessment & Plan    Pos-op afib - started on IV amio and converted to NSR - Eliquis 2.5 mg BID was started>>continue - Amio switched to PO amio to complete a 5gm load and will continue with 200 mg daily  S/P AKA - Post op day 4  Acute diastolic CHF - CXR showed pulmonary edema - BUN/creatinine were elevated and Lasix was decreased to 40 mg daily - creatinine 1.97> 1.98 > 1.98. AM labs pending - Net since admission -3.6 L - Daily weights not recorded - Patient is euvolemic on exam although he still has supplemental O2. Lungs sound clear on exam. Would try to wean - Continue lasix 40 mg daily  Cardiomyopathy - Echo showed reduced LV function, EF 30-35%, G2DD with hypokinesis of the distal septum and apex  - Troponin minimally elevated which is likely due to Afib - Patient is not a good candidate for an invasive procedure.  - Low dose Coreg added. Pressures are stable. Can likely increase - No Ace/ARB 2/2 renal insufficiency  AKI/CKD III - lasix decreased   For questions or updates, please contact [place] Please consult [email or web address] for contact info under        Signed, Cadence Ninfa Meeker, PA-C  01/04/2020, 8:42 AM      Patient seen and examined with APP.  Agree as above, with the following exceptions and changes as noted below. Gen: NAD, CV: RRR, no murmurs, Lungs: clear, Abd: soft, Extrem:  no edema, Neuro/Psych: alert and oriented x 3, normal mood and affect. All available labs, radiology testing, previous records reviewed. Feels  well and ready to go home, continue lasix and wean O2. Continue coreg, renal function prohibits use of ACEI/ARB  Elouise Munroe, MD

## 2020-01-04 NOTE — Progress Notes (Signed)
Patient and daughter given discharge instructions, medication list and follow up appointments, daughter verbalized understanding. Medications were delivered to room prior to discharge. Will discharge home as ordered. IV and tele were dcd. Georgann Bramble, Bettina Gavia RN

## 2020-01-04 NOTE — TOC Transition Note (Signed)
Transition of Care [facility]) - CM/SW Discharge Note   Patient Details  Name: Henry Wiggins MRN: [id number] Date of Birth: August 02, 1927  [facility]) CM/SW Contact:  Bethena Roys, RN Phone Number: 01/04/2020, 3:15 PM   Clinical Narrative: Case Manager spoke with daughter [name] this am regarding durable medical equipment. Case Manager called [employer] for delivery of the 3n1 to the bedside. Wheelchair order sent to [employer] and will be sent to home within five days if insurance approves. Referral sent to [facility] [place] for Hospital bed in [place]- will be delivered once approved via insurance. Orders, Face Sheet PT notes sent via fax. Daughter will provide transportation home via private vehicle. No further needs from Case Manage at this time.    Final next level of care: [place] Barriers to Discharge: Barriers Resolved   Patient Goals and CMS Choice Patient states their goals for this hospitalization and ongoing recovery are:: return home for rehab-(per daughter) CMS Medicare.gov Compare Post Acute Care list provided to:: Patient Represenative (must comment) Choice offered to / list presented to : Adult Children(daughter - [name])   Discharge Plan and Services   Discharge Planning Services: CM Consult Post Acute Care Choice: Durable Medical Equipment, Home Health          DME Arranged: 3-N-1 DME Agency: [employer] Date DME Agency Contacted: 01/04/20 Time DME Agency Contacted: 1000 Representative spoke with at DME Agency: [place]: PT [place]: [place] ([place]) Date [place]: 01/03/20 Time [place]: 1300 Representative spoke with at [place]: Butch Penny    Readmission Risk Interventions Readmission Risk Prevention Plan 01/03/2020  Post Dischage Appt Complete  Medication Screening Complete  Transportation Screening Complete  Some recent data might be hidden

## 2020-01-04 NOTE — Progress Notes (Signed)
Patient up to chair this AM, stand/pivot with 2 assist. Patient tolerated well call bell with in reach will monitor patient. Romeka Scifres, Bettina Gavia rN

## 2020-01-04 NOTE — Progress Notes (Addendum)
PROGRESS NOTE    Henry Wiggins  RDE:[id number] DOB: [birth year] DOA: 12/29/2019 PCP: Antony Contras, MD   Brief Narrative: Per consulting physician: [name] an 86 y.o.malewith past medical history significant for hypertension, hyperlipidemia, CVA, PVD, and DM type II; who presented for progressive ischemia requiring above-knee amputation on 12/29/2019 by Dr. Donzetta Matters. Since the procedure the patient had progressively worsening shortness of breath. He had been initially given IV Lasix after chest x-ray revealed interstitial opacities concerning for edema. This morning patient however developed worsening respiratory distress and was placed on a Venturi mask. Patient does not provide give any additional history. TRH consult to help assist in evaluation of patient's respiratory failure.  Patient oxygen requirement continued to improve amiodarone drip.  [place].  He is today at 4 L of oxygen.  Assessment & Plan:   Active Problems:   PAD (peripheral artery disease) (HCC)   S/P AKA (above knee amputation) (HCC)   Paroxysmal atrial fibrillation (HCC)   1-Postoperative day 4 from right AKA Per vascular. Family wants patient home.    2-Acute respiratory failure with hypoxemia secondary to acute diastolic heart failure exacerbation: Patient developed progressive shortness of breath chest x-ray show pulmonary edema.  ABG with a PO2 of 55. Plan to discharge on 40 mg daily lasix.  Cardiology following. Negative 3 L.   3-Postoperative A. Fib: Treated with  IV amiodarone drip. On Eliquis On oral  Amiodarone. Plan to do 400 mg BID for 5 doses and then 200 mg daily.  I have done AVS medication for discharge.   Elevation  of troponin: Demand ischemia secondary to diastolic heart failure Cardiology  Following.  AKI on chronic kidney disease a stage III Creatinine peaked to 2.1.. Monitor on IV Lasix. Prior creatinine 2 weeks ago 1.7--1.5 Cr stable at 1.9. Lasix change to oral.    Hypertension: Hypernatremia: Likely lab error  DM ; with hyperglycemia; started  lantus. Continue with SSI Will likely need to hold metformin at discharge due to elevated cr.  Discontinue metformin at discharge.  Daughter would like different option for Tx of DM at home other than insulin, plan to discharge patient on Tradjenta, he will need close monitoring of CBG , might need insulin if CBG elevated on Tradjenta.   Stable to discharge from IM point of view. I have performed Medications for discharge.  Needs to determine if patient will need Home oxygen.   Estimated body mass index is 29.53 kg/m as calculated from the following:   Height as of this encounter: 5\' 9"  (1.753 m).   Weight as of this encounter: 90.7 kg.   DVT prophylaxis: Eliquis Code Status: Full code Family Communication: Discussed with patient Disposition Plan: Stable to discharge home     Subjective: Denies dyspnea.  Objective: Vitals:   01/03/20 2119 01/04/20 0626 01/04/20 0814 01/04/20 0821  BP: 123/66 (!) 142/69 (!) 144/66   Pulse: 67 63    Resp: (!) 25 (!) 22 18   Temp: (!) 97.5 F (36.4 C) 97.7 F (36.5 C)    TempSrc: Oral Oral    SpO2: 98% 98%  93%  Weight:      Height:        Intake/Output Summary (Last 24 hours) at 01/04/2020 0842 Last data filed at 01/04/2020 0815 Gross per 24 hour  Intake --  Output 875 ml  Net -875 ml   Filed Weights   12/29/19 1058  Weight: 90.7 kg    Examination:  General exam: NAD Respiratory system:  CTA Cardiovascular system: S 1, S 2 IRR systolic murmur Gastrointestinal system: BS present, soft, nt Central nervous system: non focal.  Extremities: Right lower extremity status post amputation    Data Reviewed: I have personally reviewed following labs and imaging studies  CBC: Recent Labs  Lab 12/29/19 1120 12/30/19 0243 12/31/19 0139 01/01/20 0114 01/02/20 0952  WBC 15.2* 12.5* 11.1* 11.4* 11.7*  HGB 11.0* 10.6* 11.5* 11.4* 11.8*  HCT 34.5*  33.4* 36.3* 35.4* 36.9*  MCV 93.8 94.1 91.2 90.5 90.7  PLT 257 343 384 373 426   Basic Metabolic Panel: Recent Labs  Lab 12/31/19 0815 01/01/20 0114 01/02/20 0247 01/02/20 0658 01/02/20 0952 01/03/20 0813  NA 136 136 162* 132* 133* 135  K 5.2* 4.2 4.7 4.2 4.0 4.0  CL 102 99 96* 96* 96* 97*  CO2 22 23 25 24 25 27   GLUCOSE 261* 269* 147* 292* 270* 196*  BUN 55* 55* 39* 60* 59* 61*  CREATININE 2.10* 1.88* 1.57* 1.97* 1.98* 1.98*  CALCIUM 8.7* 8.5* 7.0* 7.9* 8.3* 8.3*  MG 2.3  --   --   --   --   --    GFR: Estimated Creatinine Clearance: 27.6 mL/min (A) (by C-G formula based on SCr of 1.98 mg/dL (H)). Liver Function Tests: Recent Labs  Lab 12/29/19 1120 12/31/19 0815  AST 74* 32  ALT 65* 17  ALKPHOS 81 85  BILITOT 2.3* 1.2  PROT 6.6 6.4*  ALBUMIN 2.7* 2.5*   No results for input(s): LIPASE, AMYLASE in the last 168 hours. No results for input(s): AMMONIA in the last 168 hours. Coagulation Profile: Recent Labs  Lab 12/29/19 1217  INR 1.2   Cardiac Enzymes: No results for input(s): CKTOTAL, CKMB, CKMBINDEX, TROPONINI in the last 168 hours. BNP (last 3 results) No results for input(s): PROBNP in the last 8760 hours. HbA1C: No results for input(s): HGBA1C in the last 72 hours. CBG: Recent Labs  Lab 01/03/20 0627 01/03/20 1132 01/03/20 1648 01/03/20 2121 01/04/20 0624  GLUCAP 195* 315* 203* 155* 201*   Lipid Profile: No results for input(s): CHOL, HDL, LDLCALC, TRIG, CHOLHDL, LDLDIRECT in the last 72 hours. Thyroid Function Tests: No results for input(s): TSH, T4TOTAL, FREET4, T3FREE, THYROIDAB in the last 72 hours. Anemia Panel: No results for input(s): VITAMINB12, FOLATE, FERRITIN, TIBC, IRON, RETICCTPCT in the last 72 hours. Sepsis Labs: No results for input(s): PROCALCITON, LATICACIDVEN in the last 168 hours.  Recent Results (from the past 240 hour(s))  SARS CORONAVIRUS 2 (TAT 6-24 HRS) Nasopharyngeal Nasopharyngeal Swab     Status: None   Collection  Time: 12/28/19  2:46 PM   Specimen: Nasopharyngeal Swab  Result Value Ref Range Status   SARS Coronavirus 2 NEGATIVE NEGATIVE Final    Comment: (NOTE) SARS-CoV-2 target nucleic acids are NOT DETECTED. The SARS-CoV-2 RNA is generally detectable in upper and lower respiratory specimens during the acute phase of infection. Negative results do not preclude SARS-CoV-2 infection, do not rule out co-infections with other pathogens, and should not be used as the sole basis for treatment or other patient management decisions. Negative results must be combined with clinical observations, patient history, and epidemiological information. The expected result is Negative. Fact Sheet for Patients: SugarRoll.be Fact Sheet for Healthcare Providers: https://www.woods-mathews.com/ This test is not yet approved or cleared by the [place] FDA and  has been authorized for detection and/or diagnosis of SARS-CoV-2 by FDA under an Emergency Use Authorization (EUA). This EUA will remain  in effect (meaning this test  can be used) for the duration of the COVID-19 declaration under Section 56 4(b)(1) of the Act, 21 U.S.C. section 360bbb-3(b)(1), unless the authorization is terminated or revoked sooner. Performed at [facility], [place] [address], [place], [place] [address]          Radiology Studies: No results found.      Scheduled Meds: . [START ON 01/05/2020] amiodarone  200 mg Oral Daily  . amiodarone  400 mg Oral BID  . apixaban  2.5 mg Oral BID  . carvedilol  3.125 mg Oral BID WC  . docusate sodium  100 mg Oral Daily  . feeding supplement  1 Container Oral BID BM  . furosemide  40 mg Oral Daily  . insulin aspart  0-15 Units Subcutaneous Q6H  . insulin aspart  3 Units Subcutaneous TID WC  . insulin glargine  15 Units Subcutaneous Daily  . pantoprazole  40 mg Oral Daily  . polyethylene glycol  17 g Oral Daily  . senna  1 tablet Oral Daily     Continuous Infusions: . magnesium sulfate bolus IVPB       LOS: 6 days    Time spent: 35 minutes    Atwell Mcdanel A Darris Carachure, MD [facility]   If 7PM-7AM, please contact night-coverage [email or web address] Password [facility] 01/04/2020, 8:42 AM

## 2020-01-05 DIAGNOSIS — Z4781 Encounter for orthopedic aftercare following surgical amputation: Secondary | ICD-10-CM | POA: Diagnosis not present

## 2020-01-05 DIAGNOSIS — I130 Hypertensive heart and chronic kidney disease with heart failure and stage 1 through stage 4 chronic kidney disease, or unspecified chronic kidney disease: Secondary | ICD-10-CM | POA: Diagnosis not present

## 2020-01-05 DIAGNOSIS — E1151 Type 2 diabetes mellitus with diabetic peripheral angiopathy without gangrene: Secondary | ICD-10-CM | POA: Diagnosis not present

## 2020-01-05 DIAGNOSIS — N183 Chronic kidney disease, stage 3 unspecified: Secondary | ICD-10-CM | POA: Diagnosis not present

## 2020-01-05 DIAGNOSIS — I4891 Unspecified atrial fibrillation: Secondary | ICD-10-CM | POA: Diagnosis not present

## 2020-01-05 DIAGNOSIS — M1990 Unspecified osteoarthritis, unspecified site: Secondary | ICD-10-CM | POA: Diagnosis not present

## 2020-01-05 DIAGNOSIS — R69 Illness, unspecified: Secondary | ICD-10-CM | POA: Diagnosis not present

## 2020-01-05 DIAGNOSIS — I5031 Acute diastolic (congestive) heart failure: Secondary | ICD-10-CM | POA: Diagnosis not present

## 2020-01-05 DIAGNOSIS — E1122 Type 2 diabetes mellitus with diabetic chronic kidney disease: Secondary | ICD-10-CM | POA: Diagnosis not present

## 2020-01-05 DIAGNOSIS — L97529 Non-pressure chronic ulcer of other part of left foot with unspecified severity: Secondary | ICD-10-CM | POA: Diagnosis not present

## 2020-01-05 NOTE — Care Management (Signed)
1236 01-05-20 Case Manager received call from [facility]-that they can service the patient for his wheelchair not in network with the insurance. Case Manager sent order to [facility] [place] to service the patient. Patient's insurance is in network. No further needs from Case Manager at this time. Bethena Roys, RN, BSN Case Manager [phone]

## 2020-01-05 NOTE — Discharge Summary (Signed)
[facility] Discharge Summary   Patient ID:  Henry Wiggins MRN: [id number] DOB/AGE: 01/13/1927 86 y.o.  Admit date: 12/29/2019 Discharge date: 01/03/19 Date of Surgery: 12/29/2019 Surgeon: Surgeon(s): Waynetta Sandy, MD  Admission Diagnosis: PAD (peripheral artery disease) ([place]) [I73.9] S/P AKA (above knee amputation) ([place]) [Z89.619]  Discharge Diagnoses:  PAD (peripheral artery disease) ([place]) [I73.9] S/P AKA (above knee amputation) ([place]) [Z89.619]  Secondary Diagnoses: Past Medical History:  Diagnosis Date  . Arthritis   . Bladder cancer (HCC)    Dr. Janice Norrie  . Depression   . Diabetes ([place])    type 2  . Family history of adverse reaction to anesthesia    Daughter stated that she is difficult to wake up.slow to wake up  . Full dentures   . GERD (gastroesophageal reflux disease)   . HOH (hard of hearing)   . Hyperlipidemia   . Hypertension   . Memory loss   . Stroke ([place])   . UTI (lower urinary tract infection)     Procedure(s): AMPUTATION ABOVE KNEE  Discharged Condition: stable  HPI: Henry Wiggins is a 86 y.o. male, who underwent attempted revascularization by Dr. Donzetta Matters about a week ago.  This was done for a nonhealing wound on the right foot.  The patient has become progressively worse over the last few days with unrelenting rest pain.   An arteriogram 1 week ago shows he does not have any further revascularization options in the right leg.  He also has a nonhealing ulcer in the left foot but this has not progressed.  He does not really complain of pain in the right leg.  The patient has mild dementia.  He was scheduled for primary AKA of the right LE.  Hospital Course:  Henry Wiggins is a 86 y.o. male is S/P Right Procedure(s): Right AMPUTATION ABOVE KNEE Post op he became hypoxic requiring 8L of O2 to maintain SAT's of 87-90.  He then went into A fib without history of it.  Hospitalitis and Cardiology were consult.  He was started on  Amiodarone and convert to sinus rhythm.  His ARB was held as well as Metformin for elevated Cr with history of CKD.  Coreg was added for HTN.  He was weaned off O2 prior to discharge maintaining  O2 SAT above 90 on RA.    Chest x ray suggested CHF he was given Lasix.  He responded well to the change in therapy and was alert and oriented prior to discharge.  The family wanted him to return home instead of being discharged home instead of CIR.  He was discharged home with a hospital bed, WC and 3 in 1.  HH PT.  Change in medication per Cardiology :  Amiodarone 200 mg daily starting 01/04/19 and Eliquis 2.5 BID.  Low dose Coreg 3.125 BID.  ARB on hold.  Lasix 40 mg daily.  He was started on Insulin and Metformin was stopped to protect his kidneys.    Consults:  Treatment Team:  Lbcardiology, [place], MD  Significant Diagnostic Studies: CBC Lab Results  Component Value Date   WBC 8.5 01/04/2020   HGB 11.9 (L) 01/04/2020   HCT 37.3 (L) 01/04/2020   MCV 90.1 01/04/2020   PLT 439 (H) 01/04/2020    BMET    Component Value Date/Time   NA 134 (L) 01/04/2020 0846   K 3.9 01/04/2020 0846   CL 95 (L) 01/04/2020 0846   CO2 27 01/04/2020 0846   GLUCOSE  209 (H) 01/04/2020 0846   BUN 61 (H) 01/04/2020 0846   CREATININE 1.99 (H) 01/04/2020 0846   CALCIUM 8.5 (L) 01/04/2020 0846   GFRNONAA 29 (L) 01/04/2020 0846   GFRAA 33 (L) 01/04/2020 0846   COAG Lab Results  Component Value Date   INR 1.2 12/29/2019   INR 1.14 05/26/2015     Disposition:  Discharge to :Home Discharge Instructions    Call MD for:  redness, tenderness, or signs of infection (pain, swelling, bleeding, redness, odor or green/yellow discharge around incision site)   Complete by: As directed    Call MD for:  severe or increased pain, loss or decreased feeling  in affected limb(s)   Complete by: As directed    Call MD for:  temperature >100.5   Complete by: As directed    Resume previous diet   Complete by: As directed       Allergies as of 01/04/2020   No Known Allergies     Medication List    STOP taking these medications   amLODipine 10 MG tablet Commonly known as: NORVASC   aspirin EC 81 MG tablet   ibuprofen 200 MG tablet Commonly known as: ADVIL   IODINE TINCTURE EX   losartan-hydrochlorothiazide 100-25 MG tablet Commonly known as: HYZAAR   metFORMIN 1000 MG tablet Commonly known as: GLUCOPHAGE   oxyCODONE-acetaminophen 5-325 MG tablet Commonly known as: Percocet     TAKE these medications   amiodarone 400 MG tablet Commonly known as: PACERONE Take 1 tablet (400 mg total) by mouth 2 (two) times daily for 5 doses.   amiodarone 200 MG tablet Commonly known as: PACERONE Take 1 tablet (200 mg total) by mouth daily. Notes to patient: Start after the twice a day dose   apixaban 2.5 MG Tabs tablet Commonly known as: ELIQUIS Take 1 tablet (2.5 mg total) by mouth 2 (two) times daily.   blood glucose meter kit and supplies Dispense based on patient and insurance preference. Use up to four times daily as directed. (FOR ICD-10 E10.9, E11.9).   carvedilol 3.125 MG tablet Commonly known as: COREG Take 1 tablet (3.125 mg total) by mouth 2 (two) times daily with a meal.   docusate sodium 100 MG capsule Commonly known as: COLACE Take 1 capsule (100 mg total) by mouth daily.   furosemide 40 MG tablet Commonly known as: LASIX Take 1 tablet (40 mg total) by mouth daily.   lansoprazole 15 MG capsule Commonly known as: PREVACID Take 15 mg by mouth daily as needed (acid reflux).   linagliptin 5 MG Tabs tablet Commonly known as: Tradjenta Take 1 tablet (5 mg total) by mouth daily.   PreserVision AREDS 2 Caps Take 1 tablet by mouth daily.   senna 8.6 MG Tabs tablet Commonly known as: SENOKOT Take 1 tablet (8.6 mg total) by mouth daily.   traMADol 50 MG tablet Commonly known as: Ultram Take 1 tablet (50 mg total) by mouth every 6 (six) hours as needed.   Vitamin D 50 MCG (2000  UT) tablet Take 2,000 Units by mouth daily.      Verbal and written Discharge instructions given to the patient. Wound care per Discharge AVS Follow-up Information    Ledora Bottcher, PA Follow up on 01/10/2020.   Specialties: Physician Assistant, Cardiology, Radiology Why: Please go to hospital follow-up January 19 at 2:00 PM Contact information: [address] [place] [address] [phone]        [facility] [place]  Follow up.   Specialty: [place] Why: HHPT arranged- they will call you to set up home visits [phone])       Waynetta Sandy, MD Follow up in 4 week(s).   Specialties: Vascular Surgery, Cardiology Why: office will call Contact information: [place] [address] [phone]        [place], [place] Follow up.   Why: Wheelchair- to be delivered to the home within 5 days.  Contact information: [place] [address] [phone]        [facility] Follow up.   Why: Hospital Bed- to be delivered upon insurance approval.  Contact information: [place] [address], [place] [address]          Signed: [name] 01/05/2020, 8:59 AM

## 2020-01-09 DIAGNOSIS — E782 Mixed hyperlipidemia: Secondary | ICD-10-CM | POA: Diagnosis not present

## 2020-01-09 DIAGNOSIS — M1990 Unspecified osteoarthritis, unspecified site: Secondary | ICD-10-CM | POA: Diagnosis not present

## 2020-01-09 DIAGNOSIS — R69 Illness, unspecified: Secondary | ICD-10-CM | POA: Diagnosis not present

## 2020-01-09 DIAGNOSIS — E1169 Type 2 diabetes mellitus with other specified complication: Secondary | ICD-10-CM | POA: Diagnosis not present

## 2020-01-09 DIAGNOSIS — R001 Bradycardia, unspecified: Secondary | ICD-10-CM | POA: Diagnosis not present

## 2020-01-09 DIAGNOSIS — L97529 Non-pressure chronic ulcer of other part of left foot with unspecified severity: Secondary | ICD-10-CM | POA: Diagnosis not present

## 2020-01-09 DIAGNOSIS — I509 Heart failure, unspecified: Secondary | ICD-10-CM | POA: Diagnosis not present

## 2020-01-09 DIAGNOSIS — E1122 Type 2 diabetes mellitus with diabetic chronic kidney disease: Secondary | ICD-10-CM | POA: Diagnosis not present

## 2020-01-09 DIAGNOSIS — N184 Chronic kidney disease, stage 4 (severe): Secondary | ICD-10-CM | POA: Diagnosis not present

## 2020-01-09 DIAGNOSIS — Z4781 Encounter for orthopedic aftercare following surgical amputation: Secondary | ICD-10-CM | POA: Diagnosis not present

## 2020-01-09 DIAGNOSIS — I5031 Acute diastolic (congestive) heart failure: Secondary | ICD-10-CM | POA: Diagnosis not present

## 2020-01-09 DIAGNOSIS — E1151 Type 2 diabetes mellitus with diabetic peripheral angiopathy without gangrene: Secondary | ICD-10-CM | POA: Diagnosis not present

## 2020-01-09 DIAGNOSIS — M79604 Pain in right leg: Secondary | ICD-10-CM | POA: Diagnosis not present

## 2020-01-09 DIAGNOSIS — I4891 Unspecified atrial fibrillation: Secondary | ICD-10-CM | POA: Diagnosis not present

## 2020-01-09 DIAGNOSIS — I130 Hypertensive heart and chronic kidney disease with heart failure and stage 1 through stage 4 chronic kidney disease, or unspecified chronic kidney disease: Secondary | ICD-10-CM | POA: Diagnosis not present

## 2020-01-09 DIAGNOSIS — S78111A Complete traumatic amputation at level between right hip and knee, initial encounter: Secondary | ICD-10-CM | POA: Diagnosis not present

## 2020-01-09 DIAGNOSIS — I739 Peripheral vascular disease, unspecified: Secondary | ICD-10-CM | POA: Diagnosis not present

## 2020-01-09 DIAGNOSIS — R5381 Other malaise: Secondary | ICD-10-CM | POA: Diagnosis not present

## 2020-01-09 DIAGNOSIS — N183 Chronic kidney disease, stage 3 unspecified: Secondary | ICD-10-CM | POA: Diagnosis not present

## 2020-01-09 DIAGNOSIS — R350 Frequency of micturition: Secondary | ICD-10-CM | POA: Diagnosis not present

## 2020-01-09 NOTE — Progress Notes (Deleted)
Cardiology Office Note:    Date:  01/09/2020   ID:  Henry Wiggins, DOB Dec 27, 1926, MRN [id number]  PCP:  Antony Contras, MD  Cardiologist:  Sanda Klein, MD   Referring MD: Antony Contras, MD   No chief complaint on file. ***  History of Present Illness:    Henry Wiggins is a 86 y.o. male with a hx of DM, hypertension, hyperlipidemia, prior CVA (2016, ILR are at that time was negative for A. fib), peripheral vascular disease, chronic diastolic heart failure, CKD stage III and status post right AKA on 12/29/2019.  Unfortunately he converted to atrial fibrillation in the postoperative period with shortness of breath and cardiology was consulted.  IV amiodarone was started and he converted to normal sinus rhythm.  VQ scan was negative for PE. Anticoagulation was started for a high CHA2DS2-VASc score of 8.  CXR with pulmonary edema.  Gentle diuresis with a serum creatinine of almost 2.  He was discharged euvolemic on exam.  Echocardiogram did show a reduced EF of 30 to 09%, grade 2 diastolic dysfunction with hypokinesis of the distal septum and apex.  He was noted to be not a good candidate for invasive procedures.  He returns today for follow-up.     New onset atrial fibrillation in a postoperative Chronic anticoagulation -A. fib following right AKA -Converted on IV amiodarone, transition to p.o. dosing to complete a 5 g load -Continue 200 mg daily - This patients CHA2DS2-VASc Score and unadjusted Ischemic Stroke Rate (% per year) is equal to 10.8 % stroke rate/year from a score of 8 (2age, 2stroke, CHF, HTN, DM, PAD)   Chronic systolic and diastolic heart failure -Echo showed a new reduced LV EF of 30 to 32%, grade 2 diastolic dysfunction, hypokinesis of the distal septum and apex -The patient was noted to be not a great candidate for invasive procedures -Low-dose Coreg was added 3.125 mg bid -Unable to add ACEI/ARB -Discharged on 40 mg Lasix daily  Increase Coreg? Check a  BMP   PAD -Status post right AKA -Per vascular     Past Medical History:  Diagnosis Date  . Arthritis   . Bladder cancer (HCC)    Dr. Janice Norrie  . Depression   . Diabetes ([place])    type 2  . Family history of adverse reaction to anesthesia    Daughter stated that she is difficult to wake up.slow to wake up  . Full dentures   . GERD (gastroesophageal reflux disease)   . HOH (hard of hearing)   . Hyperlipidemia   . Hypertension   . Memory loss   . Stroke ([place])   . UTI (lower urinary tract infection)     Past Surgical History:  Procedure Laterality Date  . ABDOMINAL AORTOGRAM W/LOWER EXTREMITY Bilateral 12/19/2019   Procedure: ABDOMINAL AORTOGRAM W/LOWER EXTREMITY;  Surgeon: Waynetta Sandy, MD;  Location: [facility];  Service: Cardiovascular;  Laterality: Bilateral;  . AMPUTATION Right 12/29/2019   Procedure: AMPUTATION ABOVE KNEE;  Surgeon: Waynetta Sandy, MD;  Location: [facility];  Service: Vascular;  Laterality: Right;  . APPENDECTOMY    . CARPAL TUNNEL RELEASE Right   . CATARACT EXTRACTION Bilateral   . CHOLECYSTECTOMY    . COLONOSCOPY    . EP IMPLANTABLE DEVICE N/A 05/28/2015   Procedure: Loop Recorder Insertion;  Surgeon: Sanda Klein, MD;  Location: [facility];  Service: Cardiovascular;  Laterality: N/A;  . HERNIA REPAIR     x4  .  MULTIPLE TOOTH EXTRACTIONS    . PERIPHERAL VASCULAR BALLOON ANGIOPLASTY Right 12/19/2019   Procedure: PERIPHERAL VASCULAR BALLOON ANGIOPLASTY;  Surgeon: Waynetta Sandy, MD;  Location: [facility];  Service: Cardiovascular;  Laterality: Right;  Anterior tibial artery  . TEE WITHOUT CARDIOVERSION N/A 05/28/2015   Procedure: TRANSESOPHAGEAL ECHOCARDIOGRAM (TEE);  Surgeon: Sanda Klein, MD;  Location: [facility];  Service: Cardiovascular;  Laterality: N/A;  . TONSILLECTOMY      Current Medications: No outpatient medications have been marked as taking for the 01/10/20 encounter (Appointment)  with Ledora Bottcher, [place].     Allergies:   Patient has no known allergies.   Social History   Socioeconomic History  . Marital status: Married    Spouse name: Not on file  . Number of children: 5  . Years of education: 10  . Highest education level: Not on file  Occupational History  . Occupation: Retired  Tobacco Use  . Smoking status: Former Smoker    Packs/day: 0.50    Years: 5.00    Pack years: 2.50    Types: Cigarettes    Quit date: 12/22/1953    Years since quitting: 66.0  . Smokeless tobacco: Never Used  Substance and Sexual Activity  . Alcohol use: Not Currently    Alcohol/week: 0.0 standard drinks  . Drug use: Yes    Types: Oxycodone  . Sexual activity: Not on file  Other Topics Concern  . Not on file  Social History Narrative   Lives at home with his wife.   Right-handed.   2 cups caffeine per day.   Social Determinants of Health   Financial Resource Strain:   . Difficulty of Paying Living Expenses: Not on file  Food Insecurity:   . Worried About Charity fundraiser in the Last Year: Not on file  . Ran Out of Food in the Last Year: Not on file  Transportation Needs:   . Lack of Transportation (Medical): Not on file  . Lack of Transportation (Non-Medical): Not on file  Physical Activity:   . Days of Exercise per Week: Not on file  . Minutes of Exercise per Session: Not on file  Stress:   . Feeling of Stress : Not on file  Social Connections:   . Frequency of Communication with Friends and Family: Not on file  . Frequency of Social Gatherings with Friends and Family: Not on file  . Attends Religious Services: Not on file  . Active Member of Clubs or Organizations: Not on file  . Attends Archivist Meetings: Not on file  . Marital Status: Not on file     Family History: The patient's family history includes Hypertension in his father and mother.  ROS:   Please see the history of present illness.    *** All other systems reviewed and  are negative.  EKGs/Labs/Other Studies Reviewed:    The following studies were reviewed today:  Echo 12/31/19 1. Left ventricular ejection fraction, by visual estimation, is 30 to 35%. The left ventricle has moderate to severely decreased function. There is mildly increased left ventricular hypertrophy. 2. Left ventricular diastolic parameters are consistent with Grade II diastolic dysfunction (pseudonormalization). 3. The left ventricle demonstrates regional wall motion abnormalities. 4. Global right ventricle has normal systolic function.The right ventricular size is normal. 5. Left atrial size was mildly dilated. 6. Right atrial size was normal. 7. The mitral valve is normal in structure. Mild mitral valve regurgitation. No evidence of mitral stenosis.  8. The tricuspid valve is normal in structure. 9. The aortic valve is tricuspid. Aortic valve regurgitation is trivial. Mild aortic valve sclerosis without stenosis. 10. The pulmonic valve was normal in structure. Pulmonic valve regurgitation is not visualized. 11. The inferior vena cava is dilated in size with >50% respiratory variability, suggesting right atrial pressure of 8 mmHg. 12. Severe hypokinesis of the distal septum and apex; overall moderate to severe LV dysfunction; grade 2 diastolic dsyfunction; mild LVH; mild LAE; mild MR.   EKG:  EKG is *** ordered today.  The ekg ordered today demonstrates ***  Recent Labs: 12/31/2019: ALT 17; B Natriuretic Peptide 1,620.0; Magnesium 2.3 01/04/2020: BUN 61; Creatinine, Ser 1.99; Hemoglobin 11.9; Platelets 439; Potassium 3.9; Sodium 134  Recent Lipid Panel    Component Value Date/Time   CHOL 275 (H) 05/27/2015 1136   TRIG 289 (H) 05/27/2015 1136   HDL 40 (L) 05/27/2015 1136   CHOLHDL 6.9 05/27/2015 1136   VLDL 58 (H) 05/27/2015 1136   LDLCALC 177 (H) 05/27/2015 1136    Physical Exam:    VS:  There were no vitals taken for this visit.    Wt Readings from Last 3  Encounters:  12/29/19 200 lb (90.7 kg)  12/28/19 200 lb (90.7 kg)  12/19/19 200 lb (90.7 kg)     GEN: *** Well nourished, well developed in no acute distress HEENT: Normal NECK: No JVD; No carotid bruits LYMPHATICS: No lymphadenopathy CARDIAC: ***RRR, no murmurs, rubs, gallops RESPIRATORY:  Clear to auscultation without rales, wheezing or rhonchi  ABDOMEN: Soft, non-tender, non-distended MUSCULOSKELETAL:  No edema; No deformity  SKIN: Warm and dry NEUROLOGIC:  Alert and oriented x 3 PSYCHIATRIC:  Normal affect   ASSESSMENT:    No diagnosis found. PLAN:    In order of problems listed above:  No diagnosis found.   Medication Adjustments/Labs and Tests Ordered: Current medicines are reviewed at length with the patient today.  Concerns regarding medicines are outlined above.  No orders of the defined types were placed in this encounter.  No orders of the defined types were placed in this encounter.   Signed, Ledora Bottcher, PA  01/09/2020 9:05 PM    [facility]

## 2020-01-10 ENCOUNTER — Telehealth (INDEPENDENT_AMBULATORY_CARE_PROVIDER_SITE_OTHER): Payer: Medicare HMO | Admitting: Physician Assistant

## 2020-01-10 ENCOUNTER — Ambulatory Visit: Payer: Medicare HMO | Admitting: Physician Assistant

## 2020-01-10 VITALS — BP 144/58 | HR 53

## 2020-01-10 DIAGNOSIS — N183 Chronic kidney disease, stage 3 unspecified: Secondary | ICD-10-CM | POA: Diagnosis not present

## 2020-01-10 DIAGNOSIS — I480 Paroxysmal atrial fibrillation: Secondary | ICD-10-CM

## 2020-01-10 DIAGNOSIS — I5031 Acute diastolic (congestive) heart failure: Secondary | ICD-10-CM | POA: Diagnosis not present

## 2020-01-10 DIAGNOSIS — I5042 Chronic combined systolic (congestive) and diastolic (congestive) heart failure: Secondary | ICD-10-CM

## 2020-01-10 DIAGNOSIS — Z7901 Long term (current) use of anticoagulants: Secondary | ICD-10-CM | POA: Diagnosis not present

## 2020-01-10 DIAGNOSIS — I4891 Unspecified atrial fibrillation: Secondary | ICD-10-CM | POA: Diagnosis not present

## 2020-01-10 DIAGNOSIS — L97529 Non-pressure chronic ulcer of other part of left foot with unspecified severity: Secondary | ICD-10-CM | POA: Diagnosis not present

## 2020-01-10 DIAGNOSIS — Z4781 Encounter for orthopedic aftercare following surgical amputation: Secondary | ICD-10-CM | POA: Diagnosis not present

## 2020-01-10 DIAGNOSIS — N390 Urinary tract infection, site not specified: Secondary | ICD-10-CM | POA: Diagnosis not present

## 2020-01-10 DIAGNOSIS — E1151 Type 2 diabetes mellitus with diabetic peripheral angiopathy without gangrene: Secondary | ICD-10-CM | POA: Diagnosis not present

## 2020-01-10 DIAGNOSIS — I130 Hypertensive heart and chronic kidney disease with heart failure and stage 1 through stage 4 chronic kidney disease, or unspecified chronic kidney disease: Secondary | ICD-10-CM | POA: Diagnosis not present

## 2020-01-10 DIAGNOSIS — M1990 Unspecified osteoarthritis, unspecified site: Secondary | ICD-10-CM | POA: Diagnosis not present

## 2020-01-10 DIAGNOSIS — I739 Peripheral vascular disease, unspecified: Secondary | ICD-10-CM

## 2020-01-10 DIAGNOSIS — R69 Illness, unspecified: Secondary | ICD-10-CM | POA: Diagnosis not present

## 2020-01-10 DIAGNOSIS — E1122 Type 2 diabetes mellitus with diabetic chronic kidney disease: Secondary | ICD-10-CM | POA: Diagnosis not present

## 2020-01-10 NOTE — Patient Instructions (Addendum)
Medication Instructions:  INCREASE GABAPENTIN 200MG  DAILY The current medical regimen is effective;  continue present plan and medications as directed. Please refer to the Current Medication list given to you today. If you need a refill on your cardiac medications before your next appointment, please call your pharmacy.  Follow-Up: 02-02-2020  Virtual Visit  Fabian Sharp, PA-C.    Reduce your risk of getting COVID-19 With your heart disease it is especially important for people at increased risk of severe illness from COVID-19, and those who live with them, to protect themselves from getting COVID-19. The best way to protect yourself and to help reduce the spread of the virus that causes COVID-19 is to: Marland Kitchen Limit your interactions with other people as much as possible. . Take precautions to prevent getting COVID-19 when you do interact with others. If you start feeling sick and think you may have COVID-19, get in touch with your healthcare provider within 24  At [facility], you and your health needs are our priority.  As part of our continuing mission to provide you with exceptional heart care, we have created designated Provider Care Teams.  These Care Teams include your primary Cardiologist (physician) and Advanced Practice Providers (APPs -  Physician Assistants and Nurse Practitioners) who all work together to provide you with the care you need, when you need it.  Thank you for choosing [facility] at [facility]!!  Stop Carvedilol   Take and log Blood pressure and heart rate(date, time, BP and HR) to review at follow up appointment.

## 2020-01-10 NOTE — Progress Notes (Addendum)
Virtual Visit via Video Note   This visit type was conducted due to national recommendations for restrictions regarding the COVID-19 Pandemic (e.g. social distancing) in an effort to limit this patient's exposure and mitigate transmission in our community.  Due to his co-morbid illnesses, this patient is at least at moderate risk for complications without adequate follow up.  This format is felt to be most appropriate for this patient at this time.  All issues noted in this document were discussed and addressed.  A limited physical exam was performed with this format.  Please refer to the patient's chart for his consent to telehealth for [facility].   Date:  01/12/2020   ID:  Henry Wiggins, DOB 12/11/1927, MRN [id number]  Patient Location: Home Provider Location: Home  PCP:  Antony Contras, MD  Cardiologist:  Sanda Klein, MD  Electrophysiologist:  None   Evaluation Performed:  Follow-Up Visit  Chief Complaint:  Post-op Afib  History of Present Illness:    Henry Wiggins is a 86 y.o. male with a hx of DM, hypertension, hyperlipidemia, prior CVA (2016, ILR are at that time was negative for A. fib), peripheral vascular disease, chronic diastolic heart failure, CKD stage III and status post right AKA on 12/29/2019.  Unfortunately he converted to atrial fibrillation in the postoperative period with shortness of breath and cardiology was consulted.  IV amiodarone was started and he converted to normal sinus rhythm.  VQ scan was negative for PE. Anticoagulation was started for a high CHA2DS2-VASc score of 8.  CXR with pulmonary edema.  Gentle diuresis with a serum creatinine of almost 2.  He was discharged euvolemic on exam.  Echocardiogram did show a reduced EF of 30 to 40%, grade 2 diastolic dysfunction with hypokinesis of the distal septum and apex.  He was noted to be not a good candidate for invasive procedures.  He returns today for follow-up. I was able to connect with him and his  daughter via video visit. His heart rate was in the 40s yesterday by OT on a BP cuff. We discussed taking his HR manually. He is now on amiodarone 200 mg. We discussed that if he continues to be bradycardic, we may need to temporarily stop his coreg.   Urinary frequency --> getting UA with HH today. Also started gabapentin for painful limb after amputation  No complaints today other than waking up almost hourly to urinate and in pain. The gabapentin 100 mg has helped with his pain today. She inquires about BID dosing and I agreed that she can try that.   She called back with manual heart rate. Vitals as below. He is bradycardic with lightheadedness.  The patient does not have symptoms concerning for COVID-19 infection (fever, chills, cough, or new shortness of breath).    Past Medical History:  Diagnosis Date  . Arthritis   . Bladder cancer (HCC)    Dr. Janice Norrie  . Depression   . Diabetes ([place])    type 2  . Family history of adverse reaction to anesthesia    Daughter stated that she is difficult to wake up.slow to wake up  . Full dentures   . GERD (gastroesophageal reflux disease)   . HOH (hard of hearing)   . Hyperlipidemia   . Hypertension   . Memory loss   . Stroke ([place])   . UTI (lower urinary tract infection)    Past Surgical History:  Procedure Laterality Date  . ABDOMINAL AORTOGRAM W/LOWER EXTREMITY Bilateral 12/19/2019  Procedure: ABDOMINAL AORTOGRAM W/LOWER EXTREMITY;  Surgeon: Waynetta Sandy, MD;  Location: [place] [facility];  Service: Cardiovascular;  Laterality: Bilateral;  . AMPUTATION Right 12/29/2019   Procedure: AMPUTATION ABOVE KNEE;  Surgeon: Waynetta Sandy, MD;  Location: [place];  Service: Vascular;  Laterality: Right;  . APPENDECTOMY    . CARPAL TUNNEL RELEASE Right   . CATARACT EXTRACTION Bilateral   . CHOLECYSTECTOMY    . COLONOSCOPY    . EP IMPLANTABLE DEVICE N/A 05/28/2015   Procedure: Loop Recorder Insertion;  Surgeon: Sanda Klein,  MD;  Location: [place] [facility];  Service: Cardiovascular;  Laterality: N/A;  . HERNIA REPAIR     x4  . MULTIPLE TOOTH EXTRACTIONS    . PERIPHERAL VASCULAR BALLOON ANGIOPLASTY Right 12/19/2019   Procedure: PERIPHERAL VASCULAR BALLOON ANGIOPLASTY;  Surgeon: Waynetta Sandy, MD;  Location: [place] [facility];  Service: Cardiovascular;  Laterality: Right;  Anterior tibial artery  . TEE WITHOUT CARDIOVERSION N/A 05/28/2015   Procedure: TRANSESOPHAGEAL ECHOCARDIOGRAM (TEE);  Surgeon: Sanda Klein, MD;  Location[facility] END[facility];  Service: Cardiovascular;  Laterality: N/A;  . TONSILLECTOMY       Current Meds  Medication Sig  . amiodarone (PACERONE) 200 MG tablet Take 1 tablet (200 mg total) by mouth daily.  Marland Kitchen apixaban (ELIQUIS) 2.5 MG TABS tablet Take 1 tablet (2.5 mg total) by mouth 2 (two) times daily.  . carvedilol (COREG) 3.125 MG tablet Take 1 tablet (3.125 mg total) by mouth 2 (two) times daily with a meal.  . furosemide (LASIX) 40 MG tablet Take 1 tablet (40 mg total) by mouth daily.  Marland Kitchen gabapentin (NEURONTIN) 100 MG capsule Take 200 mg by mouth daily.  Marland Kitchen linagliptin (TRADJENTA) 5 MG TABS tablet Take 1 tablet (5 mg total) by mouth daily.  . Multiple Vitamins-Minerals (PRESERVISION AREDS 2) CAPS Take 1 tablet by mouth daily.  . traMADol (ULTRAM) 50 MG tablet Take 1 tablet (50 mg total) by mouth every 6 (six) hours as needed.     Allergies:   Patient has no known allergies.   Social History   Tobacco Use  . Smoking status: Former Smoker    Packs/day: 0.50    Years: 5.00    Pack years: 2.50    Types: Cigarettes    Quit date: 12/22/1953    Years since quitting: 66.1  . Smokeless tobacco: Never Used  Substance Use Topics  . Alcohol use: Not Currently    Alcohol/week: 0.0 standard drinks  . Drug use: Yes    Types: Oxycodone     Family Hx: The patient's family history includes Hypertension in his father and mother.  ROS:   Please see the history of present illness.      All other systems reviewed and are negative.   Prior CV studies:   The following studies were reviewed today:  Echo 12/31/19: 1. Left ventricular ejection fraction, by visual estimation, is 30 to 35%. The left ventricle has moderate to severely decreased function. There is mildly increased left ventricular hypertrophy.  2. Left ventricular diastolic parameters are consistent with Grade II diastolic dysfunction (pseudonormalization).  3. The left ventricle demonstrates regional wall motion abnormalities.  4. Global right ventricle has normal systolic function.The right ventricular size is normal.  5. Left atrial size was mildly dilated.  6. Right atrial size was normal.  7. The mitral valve is normal in structure. Mild mitral valve regurgitation. No evidence of mitral stenosis.  8. The tricuspid valve is normal in structure.  9. The aortic valve is tricuspid. Aortic valve regurgitation is trivial. Mild aortic valve sclerosis without stenosis. 10. The pulmonic valve was normal in structure. Pulmonic valve regurgitation is not visualized. 11. The inferior vena cava is dilated in size with >50% respiratory variability, suggesting right atrial pressure of 8 mmHg. 12. Severe hypokinesis of the distal septum and apex; overall moderate to severe LV dysfunction; grade 2 diastolic dsyfunction; mild LVH; mild LAE; mild MR.   Labs/Other Tests and Data Reviewed:    EKG:  No ECG reviewed.  Recent Labs: 12/31/2019: ALT 17; B Natriuretic Peptide 1,620.0; Magnesium 2.3 01/04/2020: BUN 61; Creatinine, Ser 1.99; Hemoglobin 11.9; Platelets 439; Potassium 3.9; Sodium 134   Recent Lipid Panel Lab Results  Component Value Date/Time   CHOL 275 (H) 05/27/2015 11:36 AM   TRIG 289 (H) 05/27/2015 11:36 AM   HDL 40 (L) 05/27/2015 11:36 AM   CHOLHDL 6.9 05/27/2015 11:36 AM   LDLCALC 177 (H) 05/27/2015 11:36 AM    Wt Readings from Last 3 Encounters:  12/29/19 200 lb (90.7 kg)  12/28/19 200 lb (90.7 kg)   12/19/19 200 lb (90.7 kg)     Objective:    Vital Signs:  BP (!) 144/58   Pulse (!) 53   SpO2 96%    VITAL SIGNS:  reviewed GEN:  no acute distress RESPIRATORY:  respirations ulabored NEURO:  alert and oriented x 3, no obvious focal deficit PSYCH:  normal affect  ASSESSMENT & PLAN:    New onset atrial fibrillation in a postoperative Chronic anticoagulation - A. fib following right AKA - Converted on IV amiodarone, transition to p.o. dosing to complete a 5 g load - Continue 200 mg daily - This patients CHA2DS2-VASc Score and unadjusted Ischemic Stroke Rate (% per year) is equal to 10.8 % stroke rate/year from a score of 8 (2age, 2stroke, CHF, HTN, DM, PAD) - no bleeding problems - bradycardia with some lightheadedness - will stop coreg temporarily, they will keep BP and HR logs and we will discuss at next visit   Chronic systolic and diastolic heart failure - Echo showed a new reduced LV EF of 30 to 68%, grade 2 diastolic dysfunction, hypokinesis of the distal septum and apex - The patient was noted to be not a great candidate for invasive procedures - Low-dose Coreg was added 3.125 mg bid - Unable to add ACEI/ARB - Discharged on 40 mg Lasix daily - last BMP with stable renal function   PAD -Status post right AKA -Per vascular - she will increase gabapentin 100 mg BID as this is helping his pain and may promote more sleeping    COVID-19 Education: The signs and symptoms of COVID-19 were discussed with the patient and how to seek care for testing (follow up with PCP or arrange E-visit).  The importance of social distancing was discussed today.  Time:   Today, I have spent 26 minutes with the patient with telehealth technology discussing the above problems.     Medication Adjustments/Labs and Tests Ordered: Current medicines are reviewed at length with the patient today.  Concerns regarding medicines are outlined above.   Tests Ordered: No orders of the defined  types were placed in this encounter.   Medication Changes: No orders of the defined types were placed in this encounter.   Follow Up:  Virtual Visit  in 3 week(s)  Signed, Ledora Bottcher, PA  01/12/2020 11:15 AM    [place]

## 2020-01-11 DIAGNOSIS — E1151 Type 2 diabetes mellitus with diabetic peripheral angiopathy without gangrene: Secondary | ICD-10-CM | POA: Diagnosis not present

## 2020-01-11 DIAGNOSIS — I4891 Unspecified atrial fibrillation: Secondary | ICD-10-CM | POA: Diagnosis not present

## 2020-01-11 DIAGNOSIS — E1122 Type 2 diabetes mellitus with diabetic chronic kidney disease: Secondary | ICD-10-CM | POA: Diagnosis not present

## 2020-01-11 DIAGNOSIS — M1990 Unspecified osteoarthritis, unspecified site: Secondary | ICD-10-CM | POA: Diagnosis not present

## 2020-01-11 DIAGNOSIS — Z4781 Encounter for orthopedic aftercare following surgical amputation: Secondary | ICD-10-CM | POA: Diagnosis not present

## 2020-01-11 DIAGNOSIS — N183 Chronic kidney disease, stage 3 unspecified: Secondary | ICD-10-CM | POA: Diagnosis not present

## 2020-01-11 DIAGNOSIS — I5031 Acute diastolic (congestive) heart failure: Secondary | ICD-10-CM | POA: Diagnosis not present

## 2020-01-11 DIAGNOSIS — L97529 Non-pressure chronic ulcer of other part of left foot with unspecified severity: Secondary | ICD-10-CM | POA: Diagnosis not present

## 2020-01-11 DIAGNOSIS — I130 Hypertensive heart and chronic kidney disease with heart failure and stage 1 through stage 4 chronic kidney disease, or unspecified chronic kidney disease: Secondary | ICD-10-CM | POA: Diagnosis not present

## 2020-01-11 DIAGNOSIS — R69 Illness, unspecified: Secondary | ICD-10-CM | POA: Diagnosis not present

## 2020-01-12 DIAGNOSIS — I5042 Chronic combined systolic (congestive) and diastolic (congestive) heart failure: Secondary | ICD-10-CM | POA: Insufficient documentation

## 2020-01-12 DIAGNOSIS — S78111A Complete traumatic amputation at level between right hip and knee, initial encounter: Secondary | ICD-10-CM | POA: Diagnosis not present

## 2020-01-13 DIAGNOSIS — L97529 Non-pressure chronic ulcer of other part of left foot with unspecified severity: Secondary | ICD-10-CM | POA: Diagnosis not present

## 2020-01-13 DIAGNOSIS — E1151 Type 2 diabetes mellitus with diabetic peripheral angiopathy without gangrene: Secondary | ICD-10-CM | POA: Diagnosis not present

## 2020-01-13 DIAGNOSIS — E1122 Type 2 diabetes mellitus with diabetic chronic kidney disease: Secondary | ICD-10-CM | POA: Diagnosis not present

## 2020-01-13 DIAGNOSIS — R69 Illness, unspecified: Secondary | ICD-10-CM | POA: Diagnosis not present

## 2020-01-13 DIAGNOSIS — I4891 Unspecified atrial fibrillation: Secondary | ICD-10-CM | POA: Diagnosis not present

## 2020-01-13 DIAGNOSIS — I5031 Acute diastolic (congestive) heart failure: Secondary | ICD-10-CM | POA: Diagnosis not present

## 2020-01-13 DIAGNOSIS — M1990 Unspecified osteoarthritis, unspecified site: Secondary | ICD-10-CM | POA: Diagnosis not present

## 2020-01-13 DIAGNOSIS — N183 Chronic kidney disease, stage 3 unspecified: Secondary | ICD-10-CM | POA: Diagnosis not present

## 2020-01-13 DIAGNOSIS — I130 Hypertensive heart and chronic kidney disease with heart failure and stage 1 through stage 4 chronic kidney disease, or unspecified chronic kidney disease: Secondary | ICD-10-CM | POA: Diagnosis not present

## 2020-01-13 DIAGNOSIS — Z4781 Encounter for orthopedic aftercare following surgical amputation: Secondary | ICD-10-CM | POA: Diagnosis not present

## 2020-01-16 ENCOUNTER — Ambulatory Visit: Payer: Medicare HMO | Admitting: Physician Assistant

## 2020-01-16 DIAGNOSIS — I4891 Unspecified atrial fibrillation: Secondary | ICD-10-CM | POA: Diagnosis not present

## 2020-01-16 DIAGNOSIS — L97529 Non-pressure chronic ulcer of other part of left foot with unspecified severity: Secondary | ICD-10-CM | POA: Diagnosis not present

## 2020-01-16 DIAGNOSIS — R69 Illness, unspecified: Secondary | ICD-10-CM | POA: Diagnosis not present

## 2020-01-16 DIAGNOSIS — I5031 Acute diastolic (congestive) heart failure: Secondary | ICD-10-CM | POA: Diagnosis not present

## 2020-01-16 DIAGNOSIS — E1122 Type 2 diabetes mellitus with diabetic chronic kidney disease: Secondary | ICD-10-CM | POA: Diagnosis not present

## 2020-01-16 DIAGNOSIS — I130 Hypertensive heart and chronic kidney disease with heart failure and stage 1 through stage 4 chronic kidney disease, or unspecified chronic kidney disease: Secondary | ICD-10-CM | POA: Diagnosis not present

## 2020-01-16 DIAGNOSIS — N183 Chronic kidney disease, stage 3 unspecified: Secondary | ICD-10-CM | POA: Diagnosis not present

## 2020-01-16 DIAGNOSIS — M1990 Unspecified osteoarthritis, unspecified site: Secondary | ICD-10-CM | POA: Diagnosis not present

## 2020-01-16 DIAGNOSIS — Z4781 Encounter for orthopedic aftercare following surgical amputation: Secondary | ICD-10-CM | POA: Diagnosis not present

## 2020-01-16 DIAGNOSIS — E1151 Type 2 diabetes mellitus with diabetic peripheral angiopathy without gangrene: Secondary | ICD-10-CM | POA: Diagnosis not present

## 2020-01-17 DIAGNOSIS — M1990 Unspecified osteoarthritis, unspecified site: Secondary | ICD-10-CM | POA: Diagnosis not present

## 2020-01-17 DIAGNOSIS — R69 Illness, unspecified: Secondary | ICD-10-CM | POA: Diagnosis not present

## 2020-01-17 DIAGNOSIS — E1122 Type 2 diabetes mellitus with diabetic chronic kidney disease: Secondary | ICD-10-CM | POA: Diagnosis not present

## 2020-01-17 DIAGNOSIS — Z4781 Encounter for orthopedic aftercare following surgical amputation: Secondary | ICD-10-CM | POA: Diagnosis not present

## 2020-01-17 DIAGNOSIS — I5031 Acute diastolic (congestive) heart failure: Secondary | ICD-10-CM | POA: Diagnosis not present

## 2020-01-17 DIAGNOSIS — E1151 Type 2 diabetes mellitus with diabetic peripheral angiopathy without gangrene: Secondary | ICD-10-CM | POA: Diagnosis not present

## 2020-01-17 DIAGNOSIS — N183 Chronic kidney disease, stage 3 unspecified: Secondary | ICD-10-CM | POA: Diagnosis not present

## 2020-01-17 DIAGNOSIS — I4891 Unspecified atrial fibrillation: Secondary | ICD-10-CM | POA: Diagnosis not present

## 2020-01-17 DIAGNOSIS — I130 Hypertensive heart and chronic kidney disease with heart failure and stage 1 through stage 4 chronic kidney disease, or unspecified chronic kidney disease: Secondary | ICD-10-CM | POA: Diagnosis not present

## 2020-01-17 DIAGNOSIS — L97529 Non-pressure chronic ulcer of other part of left foot with unspecified severity: Secondary | ICD-10-CM | POA: Diagnosis not present

## 2020-01-18 DIAGNOSIS — M1990 Unspecified osteoarthritis, unspecified site: Secondary | ICD-10-CM | POA: Diagnosis not present

## 2020-01-18 DIAGNOSIS — R69 Illness, unspecified: Secondary | ICD-10-CM | POA: Diagnosis not present

## 2020-01-18 DIAGNOSIS — E1151 Type 2 diabetes mellitus with diabetic peripheral angiopathy without gangrene: Secondary | ICD-10-CM | POA: Diagnosis not present

## 2020-01-18 DIAGNOSIS — Z4781 Encounter for orthopedic aftercare following surgical amputation: Secondary | ICD-10-CM | POA: Diagnosis not present

## 2020-01-18 DIAGNOSIS — I130 Hypertensive heart and chronic kidney disease with heart failure and stage 1 through stage 4 chronic kidney disease, or unspecified chronic kidney disease: Secondary | ICD-10-CM | POA: Diagnosis not present

## 2020-01-18 DIAGNOSIS — I5031 Acute diastolic (congestive) heart failure: Secondary | ICD-10-CM | POA: Diagnosis not present

## 2020-01-18 DIAGNOSIS — L97529 Non-pressure chronic ulcer of other part of left foot with unspecified severity: Secondary | ICD-10-CM | POA: Diagnosis not present

## 2020-01-18 DIAGNOSIS — I4891 Unspecified atrial fibrillation: Secondary | ICD-10-CM | POA: Diagnosis not present

## 2020-01-18 DIAGNOSIS — N183 Chronic kidney disease, stage 3 unspecified: Secondary | ICD-10-CM | POA: Diagnosis not present

## 2020-01-18 DIAGNOSIS — E1122 Type 2 diabetes mellitus with diabetic chronic kidney disease: Secondary | ICD-10-CM | POA: Diagnosis not present

## 2020-01-19 DIAGNOSIS — Z4781 Encounter for orthopedic aftercare following surgical amputation: Secondary | ICD-10-CM | POA: Diagnosis not present

## 2020-01-19 DIAGNOSIS — M1990 Unspecified osteoarthritis, unspecified site: Secondary | ICD-10-CM | POA: Diagnosis not present

## 2020-01-19 DIAGNOSIS — I5031 Acute diastolic (congestive) heart failure: Secondary | ICD-10-CM | POA: Diagnosis not present

## 2020-01-19 DIAGNOSIS — I130 Hypertensive heart and chronic kidney disease with heart failure and stage 1 through stage 4 chronic kidney disease, or unspecified chronic kidney disease: Secondary | ICD-10-CM | POA: Diagnosis not present

## 2020-01-19 DIAGNOSIS — E1122 Type 2 diabetes mellitus with diabetic chronic kidney disease: Secondary | ICD-10-CM | POA: Diagnosis not present

## 2020-01-19 DIAGNOSIS — R69 Illness, unspecified: Secondary | ICD-10-CM | POA: Diagnosis not present

## 2020-01-19 DIAGNOSIS — E1151 Type 2 diabetes mellitus with diabetic peripheral angiopathy without gangrene: Secondary | ICD-10-CM | POA: Diagnosis not present

## 2020-01-19 DIAGNOSIS — N183 Chronic kidney disease, stage 3 unspecified: Secondary | ICD-10-CM | POA: Diagnosis not present

## 2020-01-19 DIAGNOSIS — L97529 Non-pressure chronic ulcer of other part of left foot with unspecified severity: Secondary | ICD-10-CM | POA: Diagnosis not present

## 2020-01-19 DIAGNOSIS — I4891 Unspecified atrial fibrillation: Secondary | ICD-10-CM | POA: Diagnosis not present

## 2020-01-20 DIAGNOSIS — N183 Chronic kidney disease, stage 3 unspecified: Secondary | ICD-10-CM | POA: Diagnosis not present

## 2020-01-20 DIAGNOSIS — L97529 Non-pressure chronic ulcer of other part of left foot with unspecified severity: Secondary | ICD-10-CM | POA: Diagnosis not present

## 2020-01-20 DIAGNOSIS — I5031 Acute diastolic (congestive) heart failure: Secondary | ICD-10-CM | POA: Diagnosis not present

## 2020-01-20 DIAGNOSIS — R69 Illness, unspecified: Secondary | ICD-10-CM | POA: Diagnosis not present

## 2020-01-20 DIAGNOSIS — Z4781 Encounter for orthopedic aftercare following surgical amputation: Secondary | ICD-10-CM | POA: Diagnosis not present

## 2020-01-20 DIAGNOSIS — E1122 Type 2 diabetes mellitus with diabetic chronic kidney disease: Secondary | ICD-10-CM | POA: Diagnosis not present

## 2020-01-20 DIAGNOSIS — I130 Hypertensive heart and chronic kidney disease with heart failure and stage 1 through stage 4 chronic kidney disease, or unspecified chronic kidney disease: Secondary | ICD-10-CM | POA: Diagnosis not present

## 2020-01-20 DIAGNOSIS — M1990 Unspecified osteoarthritis, unspecified site: Secondary | ICD-10-CM | POA: Diagnosis not present

## 2020-01-20 DIAGNOSIS — I4891 Unspecified atrial fibrillation: Secondary | ICD-10-CM | POA: Diagnosis not present

## 2020-01-20 DIAGNOSIS — E1151 Type 2 diabetes mellitus with diabetic peripheral angiopathy without gangrene: Secondary | ICD-10-CM | POA: Diagnosis not present

## 2020-01-23 ENCOUNTER — Telehealth: Payer: Self-pay | Admitting: Cardiovascular Disease

## 2020-01-23 DIAGNOSIS — Z4781 Encounter for orthopedic aftercare following surgical amputation: Secondary | ICD-10-CM | POA: Diagnosis not present

## 2020-01-23 DIAGNOSIS — L97529 Non-pressure chronic ulcer of other part of left foot with unspecified severity: Secondary | ICD-10-CM | POA: Diagnosis not present

## 2020-01-23 DIAGNOSIS — N183 Chronic kidney disease, stage 3 unspecified: Secondary | ICD-10-CM | POA: Diagnosis not present

## 2020-01-23 DIAGNOSIS — R69 Illness, unspecified: Secondary | ICD-10-CM | POA: Diagnosis not present

## 2020-01-23 DIAGNOSIS — M1990 Unspecified osteoarthritis, unspecified site: Secondary | ICD-10-CM | POA: Diagnosis not present

## 2020-01-23 DIAGNOSIS — I5031 Acute diastolic (congestive) heart failure: Secondary | ICD-10-CM | POA: Diagnosis not present

## 2020-01-23 DIAGNOSIS — E1122 Type 2 diabetes mellitus with diabetic chronic kidney disease: Secondary | ICD-10-CM | POA: Diagnosis not present

## 2020-01-23 DIAGNOSIS — E1151 Type 2 diabetes mellitus with diabetic peripheral angiopathy without gangrene: Secondary | ICD-10-CM | POA: Diagnosis not present

## 2020-01-23 DIAGNOSIS — I4891 Unspecified atrial fibrillation: Secondary | ICD-10-CM | POA: Diagnosis not present

## 2020-01-23 DIAGNOSIS — I130 Hypertensive heart and chronic kidney disease with heart failure and stage 1 through stage 4 chronic kidney disease, or unspecified chronic kidney disease: Secondary | ICD-10-CM | POA: Diagnosis not present

## 2020-01-23 MED ORDER — AMIODARONE HCL 200 MG PO TABS: 200.0000 mg | ORAL_TABLET | Freq: Every day | ORAL | 0 refills | Status: DC

## 2020-01-23 NOTE — Telephone Encounter (Signed)
Spoke to pt daughter, [name] about Amiodarone. Asked if pt was supposed to continue to take the medication. Advised for pt to continue to take medications as prescribed and to continue to monitor his HR and BP. Verbalized understanding. Has a virtual appt with Fabian Sharp, [place] on 2/11. Advised to address concerns with medications during appt. Verbalized understanding. Put in prescription.

## 2020-01-23 NOTE — Telephone Encounter (Signed)
Patient's wife is calling to inquire about whether patient should continue intake of   amiodarone (PACERONE) 200 MG tablet  based upon patient's HR. Please call and advise.   STAT if HR is under 50 or over 120 (normal HR is 60-100 beats per minute)  1) What is your heart rate?   72          2) Do you have a log of your heart rate            readings (document readings)?   1/25  54  1/26   72 1/27  69 1/28  64   3) Do you have any other symptoms? No

## 2020-01-24 DIAGNOSIS — M1990 Unspecified osteoarthritis, unspecified site: Secondary | ICD-10-CM | POA: Diagnosis not present

## 2020-01-24 DIAGNOSIS — E1122 Type 2 diabetes mellitus with diabetic chronic kidney disease: Secondary | ICD-10-CM | POA: Diagnosis not present

## 2020-01-24 DIAGNOSIS — I5031 Acute diastolic (congestive) heart failure: Secondary | ICD-10-CM | POA: Diagnosis not present

## 2020-01-24 DIAGNOSIS — Z4781 Encounter for orthopedic aftercare following surgical amputation: Secondary | ICD-10-CM | POA: Diagnosis not present

## 2020-01-24 DIAGNOSIS — N183 Chronic kidney disease, stage 3 unspecified: Secondary | ICD-10-CM | POA: Diagnosis not present

## 2020-01-24 DIAGNOSIS — I4891 Unspecified atrial fibrillation: Secondary | ICD-10-CM | POA: Diagnosis not present

## 2020-01-24 DIAGNOSIS — L97529 Non-pressure chronic ulcer of other part of left foot with unspecified severity: Secondary | ICD-10-CM | POA: Diagnosis not present

## 2020-01-24 DIAGNOSIS — R69 Illness, unspecified: Secondary | ICD-10-CM | POA: Diagnosis not present

## 2020-01-24 DIAGNOSIS — I130 Hypertensive heart and chronic kidney disease with heart failure and stage 1 through stage 4 chronic kidney disease, or unspecified chronic kidney disease: Secondary | ICD-10-CM | POA: Diagnosis not present

## 2020-01-24 DIAGNOSIS — E1151 Type 2 diabetes mellitus with diabetic peripheral angiopathy without gangrene: Secondary | ICD-10-CM | POA: Diagnosis not present

## 2020-01-25 NOTE — Progress Notes (Signed)
Virtual Visit via Telephone Note   This visit type was conducted due to national recommendations for restrictions regarding the COVID-19 Pandemic (e.g. social distancing) in an effort to limit this patient's exposure and mitigate transmission in our community.  Due to his co-morbid illnesses, this patient is at least at moderate risk for complications without adequate follow up.  This format is felt to be most appropriate for this patient at this time.  The patient did not have access to video technology/had technical difficulties with video requiring transitioning to audio format only (telephone).  All issues noted in this document were discussed and addressed.  No physical exam could be performed with this format.  Please refer to the patient's chart for his  consent to telehealth for [facility].   Date:  02/02/2020   ID:  Henry Wiggins, DOB [birth year], MRN [id number]  Patient Location: Home Provider Location: Office  PCP:  Antony Contras, MD  Cardiologist:  Sanda Klein, MD  Electrophysiologist:  None   Evaluation Performed:  Follow-Up Visit  Chief Complaint:  Post-op Afib  History of Present Illness:    Henry Wiggins is a 86 y.o. male with a hx of DM, hypertension, hyperlipidemia, prior CVA (2016, ILR are at that time was negative for A. fib), peripheral vascular disease, chronic diastolic heart failure, CKD stage III and status post right AKA on 12/29/2019.  Unfortunately he converted to atrial fibrillation in the postoperative period with shortness of breath and cardiology was consulted.  IV amiodarone was started and he converted to normal sinus rhythm.  VQ scan was negative for PE. Anticoagulation was started for a high CHA2DS2-VASc score of 8.  CXR with pulmonary edema.  Gentle diuresis with a serum creatinine of almost 2.  He was discharged euvolemic on exam.  Echocardiogram did show a reduced EF of 30 to 01%, grade 2 diastolic dysfunction with hypokinesis of the distal septum  and apex.  He was noted to be not a good candidate for invasive procedures. I saw him in follow up on 01/10/20. He was noted to be bradycardic with mildly elevated BP. I stopped his coreg and asked them to keep a BP and HR log. He was instructed to continue amiodarone.   He returns today for follow-up via telehealth visit. I spoke with his daughter Henry Wiggins, who is very active in his care. BP and HR log reviewed. HR generally in the 70s, BP 134/66 - 118/60.  He feels better on the increased dose of gabapentin.  However she still reports he complains of lightheadedness in the morning.  No near syncope or syncope.  I discussed his course of A. fib and presented options to discontinue amiodarone to see if this would help his symptoms.   The patient does not have symptoms concerning for COVID-19 infection (fever, chills, cough, or new shortness of breath).    Past Medical History:  Diagnosis Date  . Arthritis   . Bladder cancer (HCC)    Dr. Janice Norrie  . Depression   . Diabetes ([place])    type 2  . Family history of adverse reaction to anesthesia    Daughter stated that she is difficult to wake up.slow to wake up  . Full dentures   . GERD (gastroesophageal reflux disease)   . HOH (hard of hearing)   . Hyperlipidemia   . Hypertension   . Memory loss   . Stroke ([place])   . UTI (lower urinary tract infection)    Past Surgical History:  Procedure Laterality Date  . ABDOMINAL AORTOGRAM W/LOWER EXTREMITY Bilateral 12/19/2019   Procedure: ABDOMINAL AORTOGRAM W/LOWER EXTREMITY;  Surgeon: Waynetta Sandy, MD;  Location: [place] [facility];  Service: Cardiovascular;  Laterality: Bilateral;  . AMPUTATION Right 12/29/2019   Procedure: AMPUTATION ABOVE KNEE;  Surgeon: Waynetta Sandy, MD;  Location: [place];  Service: Vascular;  Laterality: Right;  . APPENDECTOMY    . CARPAL TUNNEL RELEASE Right   . CATARACT EXTRACTION Bilateral   . CHOLECYSTECTOMY    . COLONOSCOPY    . EP IMPLANTABLE DEVICE  N/A 05/28/2015   Procedure: Loop Recorder Insertion;  Surgeon: Sanda Klein, MD;  Location: [place] [facility];  Service: Cardiovascular;  Laterality: N/A;  . HERNIA REPAIR     x4  . MULTIPLE TOOTH EXTRACTIONS    . PERIPHERAL VASCULAR BALLOON ANGIOPLASTY Right 12/19/2019   Procedure: PERIPHERAL VASCULAR BALLOON ANGIOPLASTY;  Surgeon: Waynetta Sandy, MD;  Location: [place] [facility];  Service: Cardiovascular;  Laterality: Right;  Anterior tibial artery  . TEE WITHOUT CARDIOVERSION N/A 05/28/2015   Procedure: TRANSESOPHAGEAL ECHOCARDIOGRAM (TEE);  Surgeon: Sanda Klein, MD;  Location: [facility] ENDOSCOPY;  Service: Cardiovascular;  Laterality: N/A;  . TONSILLECTOMY       No outpatient medications have been marked as taking for the 02/02/20 encounter (Telemedicine) with Ledora Bottcher, [place].     Allergies:   Patient has no known allergies.   Social History   Tobacco Use  . Smoking status: Former Smoker    Packs/day: 0.50    Years: 5.00    Pack years: 2.50    Types: Cigarettes    Quit date: 12/22/1953    Years since quitting: 66.1  . Smokeless tobacco: Never Used  Substance Use Topics  . Alcohol use: Not Currently    Alcohol/week: 0.0 standard drinks  . Drug use: Yes    Types: Oxycodone     Family Hx: The patient's family history includes Hypertension in his father and mother.  ROS:   Please see the history of present illness.     All other systems reviewed and are negative.   Prior CV studies:   The following studies were reviewed today:  Echo 12/31/19: 1. Left ventricular ejection fraction, by visual estimation, is 30 to  35%. The left ventricle has moderate to severely decreased function. There  is mildly increased left ventricular hypertrophy.  2. Left ventricular diastolic parameters are consistent with Grade II  diastolic dysfunction (pseudonormalization).  3. The left ventricle demonstrates regional wall motion abnormalities.  4. Global right ventricle  has normal systolic function.The right  ventricular size is normal.  5. Left atrial size was mildly dilated.  6. Right atrial size was normal.  7. The mitral valve is normal in structure. Mild mitral valve  regurgitation. No evidence of mitral stenosis.  8. The tricuspid valve is normal in structure.  9. The aortic valve is tricuspid. Aortic valve regurgitation is trivial.  Mild aortic valve sclerosis without stenosis.  10. The pulmonic valve was normal in structure. Pulmonic valve  regurgitation is not visualized.  11. The inferior vena cava is dilated in size with >50% respiratory  variability, suggesting right atrial pressure of 8 mmHg.  12. Severe hypokinesis of the distal septum and apex; overall moderate to  severe LV dysfunction; grade 2 diastolic dsyfunction; mild LVH; mild LAE;  mild MR.   Labs/Other Tests and Data Reviewed:    EKG:  No ECG reviewed.  Recent Labs: 12/31/2019: ALT 17; B Natriuretic  Peptide 1,620.0; Magnesium 2.3 01/04/2020: BUN 61; Creatinine, Ser 1.99; Hemoglobin 11.9; Platelets 439; Potassium 3.9; Sodium 134   Recent Lipid Panel Lab Results  Component Value Date/Time   CHOL 275 (H) 05/27/2015 11:36 AM   TRIG 289 (H) 05/27/2015 11:36 AM   HDL 40 (L) 05/27/2015 11:36 AM   CHOLHDL 6.9 05/27/2015 11:36 AM   LDLCALC 177 (H) 05/27/2015 11:36 AM    Wt Readings from Last 3 Encounters:  12/29/19 200 lb (90.7 kg)  12/28/19 200 lb (90.7 kg)  12/19/19 200 lb (90.7 kg)     Objective:    Vital Signs:  BP (!) 134/56   Pulse 64   Temp 98.1 F (36.7 C)   Ht 5\' 9"  (1.753 m)   BMI 29.53 kg/m    VITAL SIGNS:  reviewed GEN:  no acute distress RESPIRATORY:  respirations unlabored NEURO:  alert and oriented x 3, no obvious focal deficit PSYCH:  normal affect  ASSESSMENT & PLAN:    New onset atrial fibrillation in a postoperative period Chronic anticoagulation - A. fib following right AKA - Converted on IV amiodarone, transition to p.o. dosing to  complete a 5 g load - Continue 200 mg daily - This patients CHA2DS2-VASc Score and unadjusted Ischemic Stroke Rate (% per year) is equal to 10.8 % stroke rate/year from a score of 8 (2age, 2stroke, CHF, HTN, DM, PAD) - low dose eliquis 2.5 mg BID, no bleeding issues -Unfortunately he was bradycardic on the amiodarone and Coreg -I stopped the carvedilol and continue to 200 mg amiodarone and has bradycardia resolved-heart rate now in the 70s -Since he is still lightheaded in the morning, I will attempt to discontinue amiodarone -I advised her to finish out the bottle she has now which will complete the month of February and then stop amiodarone without refills.  We discussed that the risk of this is him going back into atrial fibrillation.  However, if I can improve his lightheadedness and dizziness in the morning while on 2.5 mg twice daily Eliquis I think it is worth trying.  I reviewed with her that the risk would be him converting back to A. fib and possibly needing hospitalization.  She would like a trial off of the amiodarone to see if this improves symptoms. -I would like him to have close follow-up in 2 months with Dr. Sallyanne Kuster.  Hopefully at that time his carvedilol can be restarted as part of his goal-directed therapy for heart failure   Chronic systolic and diastolic heart failure - Echo showed a new reduced LV EF of 30 to 98%, grade 2 diastolic dysfunction, hypokinesis of the distal septum and apex - The patient was noted to be not a great candidate for invasive procedures -Carvedilol was discontinued for bradycardia in the setting of amiodarone, as above - Unable to add ACEI/ARB - maintained on 40 mg Lasix daily -Arlene wanted to see if we could reduce or stop the Lasix; however, I do not think this is advisable given his EF.  I also discussed that a heart failure exacerbation would likely precipitate recurrence of his atrial fibrillation.  We will not make changes to his diuretic regimen  at this time.   PAD - Status post right AKA - Per vascular - I increased gabapentin to 100 mg BID last visit for sleeping and pain relief - he is still not sleeping, but has tried a benzo and trazadone. I advised to follow up with PCP   Follow-up in 2 months.  Hopefully can restart low-dose beta-blocker at that time.   COVID-19 Education: The signs and symptoms of COVID-19 were discussed with the patient and how to seek care for testing (follow up with PCP or arrange E-visit).  The importance of social distancing was discussed today.  Time:   Today, I have spent 23 minutes with the patient with telehealth technology discussing the above problems.     Medication Adjustments/Labs and Tests Ordered: Current medicines are reviewed at length with the patient today.  Concerns regarding medicines are outlined above.   Tests Ordered: No orders of the defined types were placed in this encounter.   Medication Changes: Meds ordered this encounter  Medications  . apixaban (ELIQUIS) 2.5 MG TABS tablet    Sig: Take 1 tablet (2.5 mg total) by mouth 2 (two) times daily.    Dispense:  60 tablet    Refill:  2    Follow Up:  Virtual Visit  in 2 month(s)  Signed, Ledora Bottcher, PA  02/02/2020 12:35 PM    [facility]

## 2020-01-27 DIAGNOSIS — I130 Hypertensive heart and chronic kidney disease with heart failure and stage 1 through stage 4 chronic kidney disease, or unspecified chronic kidney disease: Secondary | ICD-10-CM | POA: Diagnosis not present

## 2020-01-27 DIAGNOSIS — I4891 Unspecified atrial fibrillation: Secondary | ICD-10-CM | POA: Diagnosis not present

## 2020-01-27 DIAGNOSIS — E1122 Type 2 diabetes mellitus with diabetic chronic kidney disease: Secondary | ICD-10-CM | POA: Diagnosis not present

## 2020-01-27 DIAGNOSIS — M1990 Unspecified osteoarthritis, unspecified site: Secondary | ICD-10-CM | POA: Diagnosis not present

## 2020-01-27 DIAGNOSIS — Z4781 Encounter for orthopedic aftercare following surgical amputation: Secondary | ICD-10-CM | POA: Diagnosis not present

## 2020-01-27 DIAGNOSIS — I5031 Acute diastolic (congestive) heart failure: Secondary | ICD-10-CM | POA: Diagnosis not present

## 2020-01-27 DIAGNOSIS — N183 Chronic kidney disease, stage 3 unspecified: Secondary | ICD-10-CM | POA: Diagnosis not present

## 2020-01-27 DIAGNOSIS — R69 Illness, unspecified: Secondary | ICD-10-CM | POA: Diagnosis not present

## 2020-01-27 DIAGNOSIS — E1151 Type 2 diabetes mellitus with diabetic peripheral angiopathy without gangrene: Secondary | ICD-10-CM | POA: Diagnosis not present

## 2020-01-27 DIAGNOSIS — L97529 Non-pressure chronic ulcer of other part of left foot with unspecified severity: Secondary | ICD-10-CM | POA: Diagnosis not present

## 2020-01-30 DIAGNOSIS — E1151 Type 2 diabetes mellitus with diabetic peripheral angiopathy without gangrene: Secondary | ICD-10-CM | POA: Diagnosis not present

## 2020-01-30 DIAGNOSIS — Z4781 Encounter for orthopedic aftercare following surgical amputation: Secondary | ICD-10-CM | POA: Diagnosis not present

## 2020-01-30 DIAGNOSIS — I4891 Unspecified atrial fibrillation: Secondary | ICD-10-CM | POA: Diagnosis not present

## 2020-01-30 DIAGNOSIS — I130 Hypertensive heart and chronic kidney disease with heart failure and stage 1 through stage 4 chronic kidney disease, or unspecified chronic kidney disease: Secondary | ICD-10-CM | POA: Diagnosis not present

## 2020-01-30 DIAGNOSIS — E1122 Type 2 diabetes mellitus with diabetic chronic kidney disease: Secondary | ICD-10-CM | POA: Diagnosis not present

## 2020-01-30 DIAGNOSIS — N183 Chronic kidney disease, stage 3 unspecified: Secondary | ICD-10-CM | POA: Diagnosis not present

## 2020-01-30 DIAGNOSIS — I5031 Acute diastolic (congestive) heart failure: Secondary | ICD-10-CM | POA: Diagnosis not present

## 2020-01-30 DIAGNOSIS — R69 Illness, unspecified: Secondary | ICD-10-CM | POA: Diagnosis not present

## 2020-01-30 DIAGNOSIS — M1990 Unspecified osteoarthritis, unspecified site: Secondary | ICD-10-CM | POA: Diagnosis not present

## 2020-01-30 DIAGNOSIS — L97529 Non-pressure chronic ulcer of other part of left foot with unspecified severity: Secondary | ICD-10-CM | POA: Diagnosis not present

## 2020-02-01 DIAGNOSIS — E1151 Type 2 diabetes mellitus with diabetic peripheral angiopathy without gangrene: Secondary | ICD-10-CM | POA: Diagnosis not present

## 2020-02-01 DIAGNOSIS — I5031 Acute diastolic (congestive) heart failure: Secondary | ICD-10-CM | POA: Diagnosis not present

## 2020-02-01 DIAGNOSIS — R69 Illness, unspecified: Secondary | ICD-10-CM | POA: Diagnosis not present

## 2020-02-01 DIAGNOSIS — L97529 Non-pressure chronic ulcer of other part of left foot with unspecified severity: Secondary | ICD-10-CM | POA: Diagnosis not present

## 2020-02-01 DIAGNOSIS — E1122 Type 2 diabetes mellitus with diabetic chronic kidney disease: Secondary | ICD-10-CM | POA: Diagnosis not present

## 2020-02-01 DIAGNOSIS — M1990 Unspecified osteoarthritis, unspecified site: Secondary | ICD-10-CM | POA: Diagnosis not present

## 2020-02-01 DIAGNOSIS — N183 Chronic kidney disease, stage 3 unspecified: Secondary | ICD-10-CM | POA: Diagnosis not present

## 2020-02-01 DIAGNOSIS — Z4781 Encounter for orthopedic aftercare following surgical amputation: Secondary | ICD-10-CM | POA: Diagnosis not present

## 2020-02-01 DIAGNOSIS — I130 Hypertensive heart and chronic kidney disease with heart failure and stage 1 through stage 4 chronic kidney disease, or unspecified chronic kidney disease: Secondary | ICD-10-CM | POA: Diagnosis not present

## 2020-02-01 DIAGNOSIS — I4891 Unspecified atrial fibrillation: Secondary | ICD-10-CM | POA: Diagnosis not present

## 2020-02-02 ENCOUNTER — Telehealth (INDEPENDENT_AMBULATORY_CARE_PROVIDER_SITE_OTHER): Payer: Medicare HMO | Admitting: Physician Assistant

## 2020-02-02 ENCOUNTER — Encounter: Payer: Self-pay | Admitting: Physician Assistant

## 2020-02-02 ENCOUNTER — Other Ambulatory Visit: Payer: Self-pay

## 2020-02-02 VITALS — BP 134/56 | HR 64 | Temp 98.1°F | Ht 69.0 in

## 2020-02-02 DIAGNOSIS — E1151 Type 2 diabetes mellitus with diabetic peripheral angiopathy without gangrene: Secondary | ICD-10-CM | POA: Diagnosis not present

## 2020-02-02 DIAGNOSIS — Z7901 Long term (current) use of anticoagulants: Secondary | ICD-10-CM | POA: Diagnosis not present

## 2020-02-02 DIAGNOSIS — R69 Illness, unspecified: Secondary | ICD-10-CM | POA: Diagnosis not present

## 2020-02-02 DIAGNOSIS — E1122 Type 2 diabetes mellitus with diabetic chronic kidney disease: Secondary | ICD-10-CM | POA: Diagnosis not present

## 2020-02-02 DIAGNOSIS — N183 Chronic kidney disease, stage 3 unspecified: Secondary | ICD-10-CM | POA: Diagnosis not present

## 2020-02-02 DIAGNOSIS — M1990 Unspecified osteoarthritis, unspecified site: Secondary | ICD-10-CM | POA: Diagnosis not present

## 2020-02-02 DIAGNOSIS — I5042 Chronic combined systolic (congestive) and diastolic (congestive) heart failure: Secondary | ICD-10-CM | POA: Diagnosis not present

## 2020-02-02 DIAGNOSIS — I739 Peripheral vascular disease, unspecified: Secondary | ICD-10-CM | POA: Diagnosis not present

## 2020-02-02 DIAGNOSIS — I5031 Acute diastolic (congestive) heart failure: Secondary | ICD-10-CM | POA: Diagnosis not present

## 2020-02-02 DIAGNOSIS — I130 Hypertensive heart and chronic kidney disease with heart failure and stage 1 through stage 4 chronic kidney disease, or unspecified chronic kidney disease: Secondary | ICD-10-CM | POA: Diagnosis not present

## 2020-02-02 DIAGNOSIS — L97529 Non-pressure chronic ulcer of other part of left foot with unspecified severity: Secondary | ICD-10-CM | POA: Diagnosis not present

## 2020-02-02 DIAGNOSIS — I4891 Unspecified atrial fibrillation: Secondary | ICD-10-CM | POA: Diagnosis not present

## 2020-02-02 DIAGNOSIS — I480 Paroxysmal atrial fibrillation: Secondary | ICD-10-CM | POA: Diagnosis not present

## 2020-02-02 DIAGNOSIS — I10 Essential (primary) hypertension: Secondary | ICD-10-CM | POA: Diagnosis not present

## 2020-02-02 DIAGNOSIS — Z4781 Encounter for orthopedic aftercare following surgical amputation: Secondary | ICD-10-CM | POA: Diagnosis not present

## 2020-02-02 MED ORDER — APIXABAN 2.5 MG PO TABS: 2.5000 mg | ORAL_TABLET | Freq: Two times a day (BID) | ORAL | 2 refills | Status: DC

## 2020-02-02 MED ORDER — FUROSEMIDE 40 MG PO TABS: 40.0000 mg | ORAL_TABLET | Freq: Every day | ORAL | 3 refills | Status: DC

## 2020-02-02 NOTE — Addendum Note (Signed)
Addended by: Therisa Doyne on: 02/02/2020 05:35 PM   Modules accepted: Orders

## 2020-02-02 NOTE — Patient Instructions (Signed)
Medication Instructions:  STOP Amiodarone when current bottle runs out.  *If you need a refill on your cardiac medications before your next appointment, please call your pharmacy*   Follow-Up: At [facility], you and your health needs are our priority.  As part of our continuing mission to provide you with exceptional heart care, we have created designated Provider Care Teams.  These Care Teams include your primary Cardiologist (physician) and Advanced Practice Providers (APPs -  Physician Assistants and Nurse Practitioners) who all work together to provide you with the care you need, when you need it.  Your next appointment:   2 month(s)  The format for your next appointment:   In Person  Provider:   Sanda Klein, MD

## 2020-02-02 NOTE — Addendum Note (Signed)
Addended by: Minette Brine on: 02/02/2020 04:33 PM   Modules accepted: Level of Service

## 2020-02-03 ENCOUNTER — Encounter: Payer: Self-pay | Admitting: Vascular Surgery

## 2020-02-03 ENCOUNTER — Ambulatory Visit (INDEPENDENT_AMBULATORY_CARE_PROVIDER_SITE_OTHER): Payer: Medicare HMO | Admitting: Vascular Surgery

## 2020-02-03 ENCOUNTER — Other Ambulatory Visit: Payer: Self-pay

## 2020-02-03 VITALS — BP 156/72 | HR 65 | Temp 97.3°F | Resp 20 | Ht 69.0 in | Wt 200.0 lb

## 2020-02-03 DIAGNOSIS — I739 Peripheral vascular disease, unspecified: Secondary | ICD-10-CM

## 2020-02-03 NOTE — Progress Notes (Addendum)
Patient ID: Henry Wiggins, male   DOB: [birth year], 86 y.o.   MRN: [id number]  Reason for Consult: Suture / Staple Removal   Referred by Antony Contras, MD  Subjective:     HPI:  Henry Wiggins is a 86 y.o. male recent history of CO2 aortogram complicated by exquisite right lower extremity pain.  Subsequently underwent right above-knee amputation.  We did not evaluate the left lower extremity at the time.  He does have ulceration of the right great and second toe.  He is here today to discuss his ulcerations on the second toe as well as have his staples removed.  Past Medical History:  Diagnosis Date  . Arthritis   . Bladder cancer (HCC)    Dr. Janice Norrie  . Depression   . Diabetes ([place])    type 2  . Family history of adverse reaction to anesthesia    Daughter stated that she is difficult to wake up.slow to wake up  . Full dentures   . GERD (gastroesophageal reflux disease)   . HOH (hard of hearing)   . Hyperlipidemia   . Hypertension   . Memory loss   . Stroke ([place])   . UTI (lower urinary tract infection)    Family History  Problem Relation Age of Onset  . Hypertension Mother   . Hypertension Father    Past Surgical History:  Procedure Laterality Date  . ABDOMINAL AORTOGRAM W/LOWER EXTREMITY Bilateral 12/19/2019   Procedure: ABDOMINAL AORTOGRAM W/LOWER EXTREMITY;  Surgeon: Waynetta Sandy, MD;  Location: [place] [facility];  Service: Cardiovascular;  Laterality: Bilateral;  . AMPUTATION Right 12/29/2019   Procedure: AMPUTATION ABOVE KNEE;  Surgeon: Waynetta Sandy, MD;  Location: [place];  Service: Vascular;  Laterality: Right;  . APPENDECTOMY    . CARPAL TUNNEL RELEASE Right   . CATARACT EXTRACTION Bilateral   . CHOLECYSTECTOMY    . COLONOSCOPY    . EP IMPLANTABLE DEVICE N/A 05/28/2015   Procedure: Loop Recorder Insertion;  Surgeon: Sanda Klein, MD;  Location: [place] [facility];  Service: Cardiovascular;  Laterality: N/A;  . HERNIA REPAIR     x4  .  MULTIPLE TOOTH EXTRACTIONS    . PERIPHERAL VASCULAR BALLOON ANGIOPLASTY Right 12/19/2019   Procedure: PERIPHERAL VASCULAR BALLOON ANGIOPLASTY;  Surgeon: Waynetta Sandy, MD;  Location: [place] [facility];  Service: Cardiovascular;  Laterality: Right;  Anterior tibial artery  . TEE WITHOUT CARDIOVERSION N/A 05/28/2015   Procedure: TRANSESOPHAGEAL ECHOCARDIOGRAM (TEE);  Surgeon: Sanda Klein, MD;  Location: [facility];  Service: Cardiovascular;  Laterality: N/A;  . TONSILLECTOMY      Short Social History:  Social History   Tobacco Use  . Smoking status: Former Smoker    Packs/day: 0.50    Years: 5.00    Pack years: 2.50    Types: Cigarettes    Quit date: 12/22/1953    Years since quitting: 66.1  . Smokeless tobacco: Never Used  Substance Use Topics  . Alcohol use: Not Currently    Alcohol/week: 0.0 standard drinks    No Known Allergies  Current Outpatient Medications  Medication Sig Dispense Refill  . apixaban (ELIQUIS) 2.5 MG TABS tablet Take 1 tablet (2.5 mg total) by mouth 2 (two) times daily. 60 tablet 2  . blood glucose meter kit and supplies Dispense based on patient and insurance preference. Use up to four times daily as directed. (FOR ICD-10 E10.9, E11.9). 1 each 0  . Cholecalciferol (VITAMIN D) 50 MCG (2000 UT)  tablet Take 2,000 Units by mouth daily.    . furosemide (LASIX) 40 MG tablet Take 1 tablet (40 mg total) by mouth daily. 30 tablet 3  . gabapentin (NEURONTIN) 100 MG capsule Take 200 mg by mouth daily.    . lansoprazole (PREVACID) 15 MG capsule Take 15 mg by mouth daily as needed (acid reflux).    Marland Kitchen linagliptin (TRADJENTA) 5 MG TABS tablet Take 1 tablet (5 mg total) by mouth daily. 30 tablet 0  . Multiple Vitamins-Minerals (PRESERVISION AREDS 2) CAPS Take 1 tablet by mouth daily.    Glory Rosebush VERIO test strip FOR USE WHEN CHECKING BLOOD GLUCOSE ONCE DAILY, ALTERNATING AM & PM, BEFORE MEALS E11.69    . traMADol (ULTRAM) 50 MG tablet Take 1 tablet (50 mg  total) by mouth every 6 (six) hours as needed. 20 tablet 0   No current facility-administered medications for this visit.    Review of Systems  Constitutional:  Constitutional negative. HENT: HENT negative.  Eyes: Eyes negative.  Respiratory: Respiratory negative.  Cardiovascular: Cardiovascular negative.  GI: Gastrointestinal negative.  Musculoskeletal: Musculoskeletal negative.  Skin: Positive for wound.  Neurological: Neurological negative. Hematologic: Hematologic/lymphatic negative.  Psychiatric: Psychiatric negative.        Objective:  Objective   Vitals:   02/03/20 1048  BP: (!) 156/72  Pulse: 65  Resp: 20  Temp: (!) 97.3 F (36.3 C)  SpO2: 97%  Weight: 200 lb (90.7 kg)  Height: _0  (1.753 m)   Body mass index is 29.53 kg/m.  Physical Exam HENT:     Nose:     Comments: Mask in place Cardiovascular:     Pulses:          Femoral pulses are 2+ on the right side and 2+ on the left side.      Popliteal pulses are 2+ on the left side.       Dorsalis pedis pulses are 0 on the left side.       Posterior tibial pulses are 0 on the left side.  Pulmonary:     Effort: Pulmonary effort is normal.  Skin:    Capillary Refill: Capillary refill takes 2 to 3 seconds.     Comments: Ulcer left first and second toe are stable  Neurological:     General: No focal deficit present.     Mental Status: He is alert.  Psychiatric:        Mood and Affect: Mood normal.        Thought Content: Thought content normal.        Judgment: Judgment normal.     Data: Previous ABI on the left was 0.7     Assessment/Plan:     86 year old male now status post right above-knee amputation.  Left lower extremity was not fully evaluated.  We will plan for CO2 aortogram to evaluate the left lower extremity with possible intervention for critical limb ischemia with toe ulcerations on the left.  Patient has considerable hesitancy given previous outcome with aortogram.  Will need to hold  Eliquis 48 hours prior to procedure.     Waynetta Sandy MD [facility]  Addendum: patient has discussed case with his daughter and they have elected not to proceed. I attempted to call her to no avail.  He will be high risk for lower extremity amputation which I do think that he understands.  Servando Snare, MD

## 2020-02-07 DIAGNOSIS — I5031 Acute diastolic (congestive) heart failure: Secondary | ICD-10-CM | POA: Diagnosis not present

## 2020-02-07 DIAGNOSIS — I130 Hypertensive heart and chronic kidney disease with heart failure and stage 1 through stage 4 chronic kidney disease, or unspecified chronic kidney disease: Secondary | ICD-10-CM | POA: Diagnosis not present

## 2020-02-07 DIAGNOSIS — E1151 Type 2 diabetes mellitus with diabetic peripheral angiopathy without gangrene: Secondary | ICD-10-CM | POA: Diagnosis not present

## 2020-02-07 DIAGNOSIS — Z4781 Encounter for orthopedic aftercare following surgical amputation: Secondary | ICD-10-CM | POA: Diagnosis not present

## 2020-02-07 DIAGNOSIS — L97529 Non-pressure chronic ulcer of other part of left foot with unspecified severity: Secondary | ICD-10-CM | POA: Diagnosis not present

## 2020-02-07 DIAGNOSIS — I4891 Unspecified atrial fibrillation: Secondary | ICD-10-CM | POA: Diagnosis not present

## 2020-02-07 DIAGNOSIS — R69 Illness, unspecified: Secondary | ICD-10-CM | POA: Diagnosis not present

## 2020-02-07 DIAGNOSIS — M1990 Unspecified osteoarthritis, unspecified site: Secondary | ICD-10-CM | POA: Diagnosis not present

## 2020-02-07 DIAGNOSIS — E1122 Type 2 diabetes mellitus with diabetic chronic kidney disease: Secondary | ICD-10-CM | POA: Diagnosis not present

## 2020-02-07 DIAGNOSIS — N183 Chronic kidney disease, stage 3 unspecified: Secondary | ICD-10-CM | POA: Diagnosis not present

## 2020-02-08 DIAGNOSIS — L97529 Non-pressure chronic ulcer of other part of left foot with unspecified severity: Secondary | ICD-10-CM | POA: Diagnosis not present

## 2020-02-08 DIAGNOSIS — E1151 Type 2 diabetes mellitus with diabetic peripheral angiopathy without gangrene: Secondary | ICD-10-CM | POA: Diagnosis not present

## 2020-02-08 DIAGNOSIS — M1990 Unspecified osteoarthritis, unspecified site: Secondary | ICD-10-CM | POA: Diagnosis not present

## 2020-02-08 DIAGNOSIS — Z4781 Encounter for orthopedic aftercare following surgical amputation: Secondary | ICD-10-CM | POA: Diagnosis not present

## 2020-02-08 DIAGNOSIS — N183 Chronic kidney disease, stage 3 unspecified: Secondary | ICD-10-CM | POA: Diagnosis not present

## 2020-02-08 DIAGNOSIS — R69 Illness, unspecified: Secondary | ICD-10-CM | POA: Diagnosis not present

## 2020-02-08 DIAGNOSIS — E1122 Type 2 diabetes mellitus with diabetic chronic kidney disease: Secondary | ICD-10-CM | POA: Diagnosis not present

## 2020-02-08 DIAGNOSIS — I130 Hypertensive heart and chronic kidney disease with heart failure and stage 1 through stage 4 chronic kidney disease, or unspecified chronic kidney disease: Secondary | ICD-10-CM | POA: Diagnosis not present

## 2020-02-08 DIAGNOSIS — I5031 Acute diastolic (congestive) heart failure: Secondary | ICD-10-CM | POA: Diagnosis not present

## 2020-02-08 DIAGNOSIS — I4891 Unspecified atrial fibrillation: Secondary | ICD-10-CM | POA: Diagnosis not present

## 2020-02-10 DIAGNOSIS — I4891 Unspecified atrial fibrillation: Secondary | ICD-10-CM | POA: Diagnosis not present

## 2020-02-10 DIAGNOSIS — I5031 Acute diastolic (congestive) heart failure: Secondary | ICD-10-CM | POA: Diagnosis not present

## 2020-02-10 DIAGNOSIS — E1151 Type 2 diabetes mellitus with diabetic peripheral angiopathy without gangrene: Secondary | ICD-10-CM | POA: Diagnosis not present

## 2020-02-10 DIAGNOSIS — N183 Chronic kidney disease, stage 3 unspecified: Secondary | ICD-10-CM | POA: Diagnosis not present

## 2020-02-10 DIAGNOSIS — M1990 Unspecified osteoarthritis, unspecified site: Secondary | ICD-10-CM | POA: Diagnosis not present

## 2020-02-10 DIAGNOSIS — Z4781 Encounter for orthopedic aftercare following surgical amputation: Secondary | ICD-10-CM | POA: Diagnosis not present

## 2020-02-10 DIAGNOSIS — R69 Illness, unspecified: Secondary | ICD-10-CM | POA: Diagnosis not present

## 2020-02-10 DIAGNOSIS — L97529 Non-pressure chronic ulcer of other part of left foot with unspecified severity: Secondary | ICD-10-CM | POA: Diagnosis not present

## 2020-02-10 DIAGNOSIS — E1122 Type 2 diabetes mellitus with diabetic chronic kidney disease: Secondary | ICD-10-CM | POA: Diagnosis not present

## 2020-02-10 DIAGNOSIS — I130 Hypertensive heart and chronic kidney disease with heart failure and stage 1 through stage 4 chronic kidney disease, or unspecified chronic kidney disease: Secondary | ICD-10-CM | POA: Diagnosis not present

## 2020-02-12 DIAGNOSIS — S78111A Complete traumatic amputation at level between right hip and knee, initial encounter: Secondary | ICD-10-CM | POA: Diagnosis not present

## 2020-02-14 DIAGNOSIS — N183 Chronic kidney disease, stage 3 unspecified: Secondary | ICD-10-CM | POA: Diagnosis not present

## 2020-02-14 DIAGNOSIS — I130 Hypertensive heart and chronic kidney disease with heart failure and stage 1 through stage 4 chronic kidney disease, or unspecified chronic kidney disease: Secondary | ICD-10-CM | POA: Diagnosis not present

## 2020-02-14 DIAGNOSIS — Z4781 Encounter for orthopedic aftercare following surgical amputation: Secondary | ICD-10-CM | POA: Diagnosis not present

## 2020-02-14 DIAGNOSIS — L97529 Non-pressure chronic ulcer of other part of left foot with unspecified severity: Secondary | ICD-10-CM | POA: Diagnosis not present

## 2020-02-14 DIAGNOSIS — R69 Illness, unspecified: Secondary | ICD-10-CM | POA: Diagnosis not present

## 2020-02-14 DIAGNOSIS — E1151 Type 2 diabetes mellitus with diabetic peripheral angiopathy without gangrene: Secondary | ICD-10-CM | POA: Diagnosis not present

## 2020-02-14 DIAGNOSIS — M1990 Unspecified osteoarthritis, unspecified site: Secondary | ICD-10-CM | POA: Diagnosis not present

## 2020-02-14 DIAGNOSIS — I4891 Unspecified atrial fibrillation: Secondary | ICD-10-CM | POA: Diagnosis not present

## 2020-02-14 DIAGNOSIS — I5031 Acute diastolic (congestive) heart failure: Secondary | ICD-10-CM | POA: Diagnosis not present

## 2020-02-14 DIAGNOSIS — E1122 Type 2 diabetes mellitus with diabetic chronic kidney disease: Secondary | ICD-10-CM | POA: Diagnosis not present

## 2020-02-15 DIAGNOSIS — I130 Hypertensive heart and chronic kidney disease with heart failure and stage 1 through stage 4 chronic kidney disease, or unspecified chronic kidney disease: Secondary | ICD-10-CM | POA: Diagnosis not present

## 2020-02-15 DIAGNOSIS — I5031 Acute diastolic (congestive) heart failure: Secondary | ICD-10-CM | POA: Diagnosis not present

## 2020-02-15 DIAGNOSIS — M1990 Unspecified osteoarthritis, unspecified site: Secondary | ICD-10-CM | POA: Diagnosis not present

## 2020-02-15 DIAGNOSIS — E1151 Type 2 diabetes mellitus with diabetic peripheral angiopathy without gangrene: Secondary | ICD-10-CM | POA: Diagnosis not present

## 2020-02-15 DIAGNOSIS — N183 Chronic kidney disease, stage 3 unspecified: Secondary | ICD-10-CM | POA: Diagnosis not present

## 2020-02-15 DIAGNOSIS — E1122 Type 2 diabetes mellitus with diabetic chronic kidney disease: Secondary | ICD-10-CM | POA: Diagnosis not present

## 2020-02-15 DIAGNOSIS — Z4781 Encounter for orthopedic aftercare following surgical amputation: Secondary | ICD-10-CM | POA: Diagnosis not present

## 2020-02-15 DIAGNOSIS — I4891 Unspecified atrial fibrillation: Secondary | ICD-10-CM | POA: Diagnosis not present

## 2020-02-15 DIAGNOSIS — R69 Illness, unspecified: Secondary | ICD-10-CM | POA: Diagnosis not present

## 2020-02-15 DIAGNOSIS — L97529 Non-pressure chronic ulcer of other part of left foot with unspecified severity: Secondary | ICD-10-CM | POA: Diagnosis not present

## 2020-02-21 DIAGNOSIS — I5031 Acute diastolic (congestive) heart failure: Secondary | ICD-10-CM | POA: Diagnosis not present

## 2020-02-21 DIAGNOSIS — R69 Illness, unspecified: Secondary | ICD-10-CM | POA: Diagnosis not present

## 2020-02-21 DIAGNOSIS — Z4781 Encounter for orthopedic aftercare following surgical amputation: Secondary | ICD-10-CM | POA: Diagnosis not present

## 2020-02-21 DIAGNOSIS — E1151 Type 2 diabetes mellitus with diabetic peripheral angiopathy without gangrene: Secondary | ICD-10-CM | POA: Diagnosis not present

## 2020-02-21 DIAGNOSIS — N183 Chronic kidney disease, stage 3 unspecified: Secondary | ICD-10-CM | POA: Diagnosis not present

## 2020-02-21 DIAGNOSIS — L97529 Non-pressure chronic ulcer of other part of left foot with unspecified severity: Secondary | ICD-10-CM | POA: Diagnosis not present

## 2020-02-21 DIAGNOSIS — M1990 Unspecified osteoarthritis, unspecified site: Secondary | ICD-10-CM | POA: Diagnosis not present

## 2020-02-21 DIAGNOSIS — E1122 Type 2 diabetes mellitus with diabetic chronic kidney disease: Secondary | ICD-10-CM | POA: Diagnosis not present

## 2020-02-21 DIAGNOSIS — I130 Hypertensive heart and chronic kidney disease with heart failure and stage 1 through stage 4 chronic kidney disease, or unspecified chronic kidney disease: Secondary | ICD-10-CM | POA: Diagnosis not present

## 2020-02-21 DIAGNOSIS — I4891 Unspecified atrial fibrillation: Secondary | ICD-10-CM | POA: Diagnosis not present

## 2020-02-22 DIAGNOSIS — I130 Hypertensive heart and chronic kidney disease with heart failure and stage 1 through stage 4 chronic kidney disease, or unspecified chronic kidney disease: Secondary | ICD-10-CM | POA: Diagnosis not present

## 2020-02-22 DIAGNOSIS — M1990 Unspecified osteoarthritis, unspecified site: Secondary | ICD-10-CM | POA: Diagnosis not present

## 2020-02-22 DIAGNOSIS — E1151 Type 2 diabetes mellitus with diabetic peripheral angiopathy without gangrene: Secondary | ICD-10-CM | POA: Diagnosis not present

## 2020-02-22 DIAGNOSIS — N183 Chronic kidney disease, stage 3 unspecified: Secondary | ICD-10-CM | POA: Diagnosis not present

## 2020-02-22 DIAGNOSIS — Z4781 Encounter for orthopedic aftercare following surgical amputation: Secondary | ICD-10-CM | POA: Diagnosis not present

## 2020-02-22 DIAGNOSIS — R69 Illness, unspecified: Secondary | ICD-10-CM | POA: Diagnosis not present

## 2020-02-22 DIAGNOSIS — I4891 Unspecified atrial fibrillation: Secondary | ICD-10-CM | POA: Diagnosis not present

## 2020-02-22 DIAGNOSIS — E1122 Type 2 diabetes mellitus with diabetic chronic kidney disease: Secondary | ICD-10-CM | POA: Diagnosis not present

## 2020-02-22 DIAGNOSIS — I5031 Acute diastolic (congestive) heart failure: Secondary | ICD-10-CM | POA: Diagnosis not present

## 2020-02-22 DIAGNOSIS — L97529 Non-pressure chronic ulcer of other part of left foot with unspecified severity: Secondary | ICD-10-CM | POA: Diagnosis not present

## 2020-02-23 ENCOUNTER — Ambulatory Visit: Payer: Medicare HMO

## 2020-02-28 DIAGNOSIS — L97529 Non-pressure chronic ulcer of other part of left foot with unspecified severity: Secondary | ICD-10-CM | POA: Diagnosis not present

## 2020-02-28 DIAGNOSIS — M1990 Unspecified osteoarthritis, unspecified site: Secondary | ICD-10-CM | POA: Diagnosis not present

## 2020-02-28 DIAGNOSIS — N183 Chronic kidney disease, stage 3 unspecified: Secondary | ICD-10-CM | POA: Diagnosis not present

## 2020-02-28 DIAGNOSIS — E1151 Type 2 diabetes mellitus with diabetic peripheral angiopathy without gangrene: Secondary | ICD-10-CM | POA: Diagnosis not present

## 2020-02-28 DIAGNOSIS — Z4781 Encounter for orthopedic aftercare following surgical amputation: Secondary | ICD-10-CM | POA: Diagnosis not present

## 2020-02-28 DIAGNOSIS — E1122 Type 2 diabetes mellitus with diabetic chronic kidney disease: Secondary | ICD-10-CM | POA: Diagnosis not present

## 2020-02-28 DIAGNOSIS — I4891 Unspecified atrial fibrillation: Secondary | ICD-10-CM | POA: Diagnosis not present

## 2020-02-28 DIAGNOSIS — I130 Hypertensive heart and chronic kidney disease with heart failure and stage 1 through stage 4 chronic kidney disease, or unspecified chronic kidney disease: Secondary | ICD-10-CM | POA: Diagnosis not present

## 2020-02-28 DIAGNOSIS — R69 Illness, unspecified: Secondary | ICD-10-CM | POA: Diagnosis not present

## 2020-02-28 DIAGNOSIS — I5031 Acute diastolic (congestive) heart failure: Secondary | ICD-10-CM | POA: Diagnosis not present

## 2020-02-29 ENCOUNTER — Other Ambulatory Visit: Payer: Self-pay

## 2020-03-03 ENCOUNTER — Other Ambulatory Visit (HOSPITAL_COMMUNITY)
Admission: RE | Admit: 2020-03-03 | Discharge: 2020-03-03 | Disposition: A | Discharge status: Home / Self Care | Payer: Medicare HMO | Source: Ambulatory Visit | Attending: Vascular Surgery | Admitting: Vascular Surgery

## 2020-03-03 DIAGNOSIS — Z01812 Encounter for preprocedural laboratory examination: Secondary | ICD-10-CM | POA: Diagnosis not present

## 2020-03-03 DIAGNOSIS — Z20822 Contact with and (suspected) exposure to covid-19: Secondary | ICD-10-CM | POA: Diagnosis not present

## 2020-03-03 LAB — SARS CORONAVIRUS 2 (TAT 6-24 HRS): SARS Coronavirus 2: NEGATIVE

## 2020-03-05 ENCOUNTER — Other Ambulatory Visit: Payer: Self-pay

## 2020-03-05 ENCOUNTER — Encounter (HOSPITAL_COMMUNITY)
Admission: RE | Disposition: A | Discharge status: Home / Self Care | Payer: Self-pay | Source: Home / Self Care | Attending: Vascular Surgery

## 2020-03-05 ENCOUNTER — Ambulatory Visit (HOSPITAL_COMMUNITY)
Admission: RE | Admit: 2020-03-05 | Discharge: 2020-03-05 | Disposition: A | Discharge status: Home / Self Care | Payer: Medicare HMO | Attending: Vascular Surgery | Admitting: Vascular Surgery

## 2020-03-05 DIAGNOSIS — Z8673 Personal history of transient ischemic attack (TIA), and cerebral infarction without residual deficits: Secondary | ICD-10-CM | POA: Insufficient documentation

## 2020-03-05 DIAGNOSIS — K219 Gastro-esophageal reflux disease without esophagitis: Secondary | ICD-10-CM | POA: Diagnosis not present

## 2020-03-05 DIAGNOSIS — Z79899 Other long term (current) drug therapy: Secondary | ICD-10-CM | POA: Diagnosis not present

## 2020-03-05 DIAGNOSIS — Z89611 Acquired absence of right leg above knee: Secondary | ICD-10-CM | POA: Diagnosis not present

## 2020-03-05 DIAGNOSIS — E1151 Type 2 diabetes mellitus with diabetic peripheral angiopathy without gangrene: Secondary | ICD-10-CM | POA: Diagnosis not present

## 2020-03-05 DIAGNOSIS — L97529 Non-pressure chronic ulcer of other part of left foot with unspecified severity: Secondary | ICD-10-CM | POA: Diagnosis not present

## 2020-03-05 DIAGNOSIS — Z8551 Personal history of malignant neoplasm of bladder: Secondary | ICD-10-CM | POA: Diagnosis not present

## 2020-03-05 DIAGNOSIS — I10 Essential (primary) hypertension: Secondary | ICD-10-CM | POA: Insufficient documentation

## 2020-03-05 DIAGNOSIS — E785 Hyperlipidemia, unspecified: Secondary | ICD-10-CM | POA: Insufficient documentation

## 2020-03-05 DIAGNOSIS — I70245 Atherosclerosis of native arteries of left leg with ulceration of other part of foot: Secondary | ICD-10-CM | POA: Diagnosis present

## 2020-03-05 DIAGNOSIS — Z87891 Personal history of nicotine dependence: Secondary | ICD-10-CM | POA: Diagnosis not present

## 2020-03-05 DIAGNOSIS — Z7984 Long term (current) use of oral hypoglycemic drugs: Secondary | ICD-10-CM | POA: Insufficient documentation

## 2020-03-05 DIAGNOSIS — Z7901 Long term (current) use of anticoagulants: Secondary | ICD-10-CM | POA: Insufficient documentation

## 2020-03-05 DIAGNOSIS — E11621 Type 2 diabetes mellitus with foot ulcer: Secondary | ICD-10-CM | POA: Diagnosis not present

## 2020-03-05 DIAGNOSIS — F329 Major depressive disorder, single episode, unspecified: Secondary | ICD-10-CM | POA: Insufficient documentation

## 2020-03-05 HISTORY — PX: PERIPHERAL VASCULAR ATHERECTOMY: CATH118256

## 2020-03-05 HISTORY — PX: ENDOVASCULAR REPAIR/STENT GRAFT: CATH118280

## 2020-03-05 HISTORY — PX: ABDOMINAL AORTOGRAM W/LOWER EXTREMITY: CATH118223

## 2020-03-05 LAB — POCT I-STAT, CHEM 8
BUN: 28 mg/dL — ABNORMAL HIGH (ref 8–23)
Calcium, Ion: 1.22 mmol/L (ref 1.15–1.40)
Chloride: 103 mmol/L (ref 98–111)
Creatinine, Ser: 1.4 mg/dL — ABNORMAL HIGH (ref 0.61–1.24)
Glucose, Bld: 182 mg/dL — ABNORMAL HIGH (ref 70–99)
HCT: 44 % (ref 39.0–52.0)
Hemoglobin: 15 g/dL (ref 13.0–17.0)
Potassium: 3.8 mmol/L (ref 3.5–5.1)
Sodium: 141 mmol/L (ref 135–145)
TCO2: 31 mmol/L (ref 22–32)

## 2020-03-05 SURGERY — ABDOMINAL AORTOGRAM W/LOWER EXTREMITY
Anesthesia: LOCAL | Laterality: Left

## 2020-03-05 MED ORDER — FENTANYL CITRATE (PF) 100 MCG/2ML IJ SOLN
INTRAMUSCULAR | Status: AC
  Filled 2020-03-05: qty 2

## 2020-03-05 MED ORDER — HEPARIN (PORCINE) IN NACL 1000-0.9 UT/500ML-% IV SOLN
INTRAVENOUS | Status: AC
  Filled 2020-03-05: qty 1000

## 2020-03-05 MED ORDER — CLOPIDOGREL BISULFATE 75 MG PO TABS: 300.0000 mg | ORAL_TABLET | Freq: Once | ORAL | Status: DC

## 2020-03-05 MED ORDER — CLOPIDOGREL BISULFATE 300 MG PO TABS
ORAL_TABLET | ORAL | Status: AC
  Filled 2020-03-05: qty 1

## 2020-03-05 MED ORDER — ONDANSETRON HCL 4 MG/2ML IJ SOLN: 4.0000 mg | Freq: Four times a day (QID) | INTRAMUSCULAR | Status: DC | PRN

## 2020-03-05 MED ORDER — SODIUM CHLORIDE 0.9 % IV SOLN: INTRAVENOUS | Status: DC

## 2020-03-05 MED ORDER — CLOPIDOGREL BISULFATE 75 MG PO TABS: 75.0000 mg | ORAL_TABLET | Freq: Every day | ORAL | Status: DC

## 2020-03-05 MED ORDER — ROSUVASTATIN CALCIUM 10 MG PO TABS: 10.0000 mg | ORAL_TABLET | Freq: Every day | ORAL | Status: DC

## 2020-03-05 MED ORDER — ACETAMINOPHEN 325 MG PO TABS: 650.0000 mg | ORAL_TABLET | ORAL | Status: DC | PRN

## 2020-03-05 MED ORDER — SODIUM CHLORIDE 0.9 % IV SOLN: 250.0000 mL | INTRAVENOUS | Status: DC | PRN

## 2020-03-05 MED ORDER — LABETALOL HCL 5 MG/ML IV SOLN: 10.0000 mg | INTRAVENOUS | Status: DC | PRN

## 2020-03-05 MED ORDER — HYDRALAZINE HCL 20 MG/ML IJ SOLN
INTRAMUSCULAR | Status: DC | PRN
  Administered 2020-03-05 (×2): 10 mg via INTRAVENOUS

## 2020-03-05 MED ORDER — IODIXANOL 320 MG/ML IV SOLN
INTRAVENOUS | Status: DC | PRN
  Administered 2020-03-05: 40 mL

## 2020-03-05 MED ORDER — FENTANYL CITRATE (PF) 100 MCG/2ML IJ SOLN
INTRAMUSCULAR | Status: DC | PRN
  Administered 2020-03-05: 50 ug via INTRAVENOUS

## 2020-03-05 MED ORDER — LIDOCAINE HCL (PF) 1 % IJ SOLN
INTRAMUSCULAR | Status: DC | PRN
  Administered 2020-03-05: 15 mL

## 2020-03-05 MED ORDER — CLOPIDOGREL BISULFATE 300 MG PO TABS
ORAL_TABLET | ORAL | Status: DC | PRN
  Administered 2020-03-05: 300 mg via ORAL

## 2020-03-05 MED ORDER — HEPARIN (PORCINE) IN NACL 1000-0.9 UT/500ML-% IV SOLN
INTRAVENOUS | Status: DC | PRN
  Administered 2020-03-05 (×2): 500 mL

## 2020-03-05 MED ORDER — LIDOCAINE HCL (PF) 1 % IJ SOLN
INTRAMUSCULAR | Status: AC
  Filled 2020-03-05: qty 30

## 2020-03-05 MED ORDER — SODIUM CHLORIDE 0.9% FLUSH: 3.0000 mL | INTRAVENOUS | Status: DC | PRN

## 2020-03-05 MED ORDER — HEPARIN SODIUM (PORCINE) 1000 UNIT/ML IJ SOLN
INTRAMUSCULAR | Status: DC | PRN
  Administered 2020-03-05: 4000 [IU] via INTRAVENOUS
  Administered 2020-03-05: 5000 [IU] via INTRAVENOUS

## 2020-03-05 MED ORDER — LABETALOL HCL 5 MG/ML IV SOLN
INTRAVENOUS | Status: DC | PRN
  Administered 2020-03-05: 10 mg via INTRAVENOUS

## 2020-03-05 MED ORDER — HYDRALAZINE HCL 20 MG/ML IJ SOLN
INTRAMUSCULAR | Status: AC
  Filled 2020-03-05: qty 1

## 2020-03-05 MED ORDER — SODIUM CHLORIDE 0.9% FLUSH: 3.0000 mL | Freq: Two times a day (BID) | INTRAVENOUS | Status: DC

## 2020-03-05 MED ORDER — CLOPIDOGREL BISULFATE 75 MG PO TABS: 75.0000 mg | ORAL_TABLET | Freq: Every day | ORAL | 11 refills | Status: DC

## 2020-03-05 MED ORDER — HYDRALAZINE HCL 20 MG/ML IJ SOLN: 5.0000 mg | INTRAMUSCULAR | Status: DC | PRN

## 2020-03-05 MED ORDER — ROSUVASTATIN CALCIUM 10 MG PO TABS: 10.0000 mg | ORAL_TABLET | Freq: Every day | ORAL | 11 refills | Status: DC

## 2020-03-05 SURGICAL SUPPLY — 26 items
BALLN STERLING OTW 3X100X150 (BALLOONS) ×2
BALLOON STERLING OTW 3X100X150 (BALLOONS) ×1 IMPLANT
CATH AURYON 5FR ATHEREC 1.5 (CATHETERS) ×2 IMPLANT
CATH CXI SUPP 2.6F 150 ANG (CATHETERS) ×2 IMPLANT
CATH OMNI FLUSH 5F 65CM (CATHETERS) ×2 IMPLANT
CATH QUICKCROSS SUPP .035X90CM (MICROCATHETER) ×2 IMPLANT
CATH STRAIGHT 5FR 65CM (CATHETERS) ×2 IMPLANT
CLOSURE MYNX CONTROL 6F/7F (Vascular Products) ×2 IMPLANT
FILTER CO2 0.2 MICRON (VASCULAR PRODUCTS) ×2 IMPLANT
GLIDEWIRE ADV .035X260CM (WIRE) ×2 IMPLANT
KIT MICROPUNCTURE NIT STIFF (SHEATH) ×2 IMPLANT
KIT PV (KITS) ×2 IMPLANT
RESERVOIR CO2 (VASCULAR PRODUCTS) ×2 IMPLANT
SET FLUSH CO2 (MISCELLANEOUS) ×2 IMPLANT
SHEATH PINNACLE 5F 10CM (SHEATH) ×2 IMPLANT
SHEATH PINNACLE 6F 10CM (SHEATH) ×2 IMPLANT
SHEATH PINNACLE ST 6F 45CM (SHEATH) ×2 IMPLANT
SHEATH PROBE COVER 6X72 (BAG) ×2 IMPLANT
STENT SYNERGY XD 3.0X48 (Permanent Stent) ×1 IMPLANT
SYNERGY XD 3.0X48 (Permanent Stent) ×2 IMPLANT
SYR MEDRAD MARK V 150ML (SYRINGE) ×2 IMPLANT
TRANSDUCER W/STOPCOCK (MISCELLANEOUS) ×2 IMPLANT
TRAY PV CATH (CUSTOM PROCEDURE TRAY) ×2 IMPLANT
WIRE BENTSON .035X145CM (WIRE) ×2 IMPLANT
WIRE G V18X300CM (WIRE) ×4 IMPLANT
WIRE SPARTACORE .014X300CM (WIRE) ×2 IMPLANT

## 2020-03-05 NOTE — Op Note (Signed)
    Patient name: Henry Wiggins MRN: [id number] DOB: Aug 01, 1927 Sex: male  03/05/2020 Pre-operative Diagnosis: Critical left lower extremity ischemia with wounds Post-operative diagnosis:  Same Surgeon:  Erlene Quan C. Donzetta Matters, MD Procedure Performed: 1.  Ultrasound-guided cannulation right common femoral artery 2.  CO2 aortogram and contrast left lower extremity angiogram 3.  Laser atherectomy of left TP trunk and peroneal artery 4.  Stent of left TP trunk and peroneal artery with 3 x 48 mm coronary drug-eluting stent 5.  Mynx device closure right common femoral artery  Indications: 86 year old male with history of right above-knee amputation has left-sided wounds on his toes.  He is indicated for angiography possible intervention.  He has elevated creatinine we will use CO2.  Findings: There were multiple nonflow limiting stenosis throughout his SFA and popliteal segment.  TP trunk and proximal peroneal artery were occluded for a total of 10 cm's.  After laser arthrectomy and stenting we do have dissection which is nonflow limiting and is stented there is no residual stenosis.   Procedure:  The patient was identified in the holding area and taken to room 8.  The patient was then placed supine on the table and prepped and draped in the usual sterile fashion.  A time out was called.  Ultrasound was used to evaluate the right common femoral artery.  There is anesthetized with 1% lidocaine cannulated with direct ultrasound visualization a micropuncture needle followed the wire sheath.  Images saved the permanent record.  We placed a Bentson wire followed by 5 [place] sheath.  We placed an Omni catheter level 1 perform CO2 angiography.  We crossed the bifurcation with Omni catheter and Glidewire advantage.  We then used a straight catheter.  We performed left lower extremity angiography to the level of the knee with CO2.  We then initiated 4000 units of heparin.  We use Glidewire advantage and quick cross  level of the knee performed contrasted angiography below there.  We then exchanged over Glidewire advantage for long 6 French sheath patient was given additional 5000 units of heparin for a total of 9000.  We used V 18 and CXI catheter to cross the occluded segment.  We confirmed intraluminal access.  We placed an 014 wire.  We performed laser atherectomy of the entire 10 cm length.  This was then postdilated with 3 mm balloon.  There was a residual dissection which was primarily stented with 3 mm coronary balloon.  Completion demonstrated no residual stenosis or flow-limiting dissection and the dissection was covered.  We performed contrasted angiography of the SFA there did not appear to be any flow-limiting although multiple nonflow limiting stenoses throughout.  Satisfied we exchanged for short 6 French sheath placed a minx device deployed this without complication.  He tolerated the procedure well without immediate complication.   Contrast: 40cc   Rigel Filsinger C. Donzetta Matters, MD [facility] [place] Office: [phone] Pager: [phone]

## 2020-03-05 NOTE — H&P (Signed)
HPI:  Henry Wiggins is a 86 y.o. male recent history of CO2 aortogram complicated by exquisite right lower extremity pain. Subsequently underwent right above-knee amputation. We did not evaluate the left lower extremity at the time. He does have ulceration of the right great and second toe. He is here today to discuss his ulcerations on the second toe as well as have his staples removed.      Past Medical History:  Diagnosis Date  . Arthritis   . Bladder cancer (HCC)    Dr. Janice Norrie  . Depression   . Diabetes ([place])    type 2  . Family history of adverse reaction to anesthesia    Daughter stated that she is difficult to wake up.slow to wake up  . Full dentures   . GERD (gastroesophageal reflux disease)   . HOH (hard of hearing)   . Hyperlipidemia   . Hypertension   . Memory loss   . Stroke ([place])   . UTI (lower urinary tract infection)         Family History  Problem Relation Age of Onset  . Hypertension Mother   . Hypertension Father         Past Surgical History:  Procedure Laterality Date  . ABDOMINAL AORTOGRAM W/LOWER EXTREMITY Bilateral 12/19/2019   Procedure: ABDOMINAL AORTOGRAM W/LOWER EXTREMITY; Surgeon: Waynetta Sandy, MD; Location: [place] [facility]; Service: Cardiovascular; Laterality: Bilateral;  . AMPUTATION Right 12/29/2019   Procedure: AMPUTATION ABOVE KNEE; Surgeon: Waynetta Sandy, MD; Location: [place]; Service: Vascular; Laterality: Right;  . APPENDECTOMY    . CARPAL TUNNEL RELEASE Right   . CATARACT EXTRACTION Bilateral   . CHOLECYSTECTOMY    . COLONOSCOPY    . EP IMPLANTABLE DEVICE N/A 05/28/2015   Procedure: Loop Recorder Insertion; Surgeon: Sanda Klein, MD; Location: [place] [facility]; Service: Cardiovascular; Laterality: N/A;  . HERNIA REPAIR     x4  . MULTIPLE TOOTH EXTRACTIONS    . PERIPHERAL VASCULAR BALLOON ANGIOPLASTY Right 12/19/2019   Procedure: PERIPHERAL VASCULAR BALLOON ANGIOPLASTY; Surgeon: Waynetta Sandy, MD; Location: [place] [facility]; Service: Cardiovascular; Laterality: Right; Anterior tibial artery  . TEE WITHOUT CARDIOVERSION N/A 05/28/2015   Procedure: TRANSESOPHAGEAL ECHOCARDIOGRAM (TEE); Surgeon: Sanda Klein, MD; Location: [facility]; Service: Cardiovascular; Laterality: N/A;  . TONSILLECTOMY     Short Social History:  Social History        Tobacco Use  . Smoking status: Former Smoker    Packs/day: 0.50    Years: 5.00    Pack years: 2.50    Types: Cigarettes    Quit date: 12/22/1953    Years since quitting: 66.1  . Smokeless tobacco: Never Used  Substance Use Topics  . Alcohol use: Not Currently    Alcohol/week: 0.0 standard drinks   No Known Allergies        Current Outpatient Medications  Medication Sig Dispense Refill  . apixaban (ELIQUIS) 2.5 MG TABS tablet Take 1 tablet (2.5 mg total) by mouth 2 (two) times daily. 60 tablet 2  . blood glucose meter kit and supplies Dispense based on patient and insurance preference. Use up to four times daily as directed. (FOR ICD-10 E10.9, E11.9). 1 each 0  . Cholecalciferol (VITAMIN D) 50 MCG (2000 UT) tablet Take 2,000 Units by mouth daily.    . furosemide (LASIX) 40 MG tablet Take 1 tablet (40 mg total) by mouth daily. 30 tablet 3  . gabapentin (NEURONTIN) 100 MG capsule Take 200 mg  by mouth daily.    . lansoprazole (PREVACID) 15 MG capsule Take 15 mg by mouth daily as needed (acid reflux).    Marland Kitchen linagliptin (TRADJENTA) 5 MG TABS tablet Take 1 tablet (5 mg total) by mouth daily. 30 tablet 0  . Multiple Vitamins-Minerals (PRESERVISION AREDS 2) CAPS Take 1 tablet by mouth daily.    Glory Rosebush VERIO test strip FOR USE WHEN CHECKING BLOOD GLUCOSE ONCE DAILY, ALTERNATING AM & PM, BEFORE MEALS E11.69    . traMADol (ULTRAM) 50 MG tablet Take 1 tablet (50 mg total) by mouth every 6 (six) hours as needed. 20 tablet 0   No current facility-administered medications for this visit.   Review of Systems  Constitutional:  Constitutional negative.  HENT: HENT negative.  Eyes: Eyes negative.  Respiratory: Respiratory negative.  Cardiovascular: Cardiovascular negative.  GI: Gastrointestinal negative.  Musculoskeletal: Musculoskeletal negative.  Skin: Positive for wound.  Neurological: Neurological negative.  Hematologic: Hematologic/lymphatic negative.  Psychiatric: Psychiatric negative.   Vitals:   03/05/20 0902  BP: (!) 185/100  Pulse: 72  Resp: 17  Temp: (!) 97.1 F (36.2 C)  SpO2: 98%     Body mass index is 29.53 kg/m.  Physical Exam  HENT:  Nose:  Comments: Mask in place Cardiovascular:  Pulses:  Femoral pulses are 2+ on the right side and 2+ on the left side.  Popliteal pulses are 2+ on the left side.  Dorsalis pedis pulses are 0 on the left side.  Posterior tibial pulses are 0 on the left side.  Pulmonary:  Effort: Pulmonary effort is normal.  Skin:  Capillary Refill: Capillary refill takes 2 to 3 seconds.  Comments: Ulcer left first and second toe are stable  Neurological:  General: No focal deficit present.  Mental Status: He is alert.  Psychiatric:  Mood and Affect: Mood normal.  Thought Content: Thought content normal.  Judgment: Judgment normal.   Data: Previous ABI on the left was 0.7   Assessment/Plan:   86 year old male now status post right above-knee amputation. Left lower extremity was not fully evaluated. We will plan for CO2 aortogram to evaluate the left lower extremity with possible intervention for critical limb ischemia with toe ulcerations on the left. Patient has considerable hesitancy given previous outcome with aortogram. Will need to hold Eliquis 48 hours prior to procedure.   Joellyn Grandt C. Donzetta Matters, MD [facility] [place] Office: [phone] Pager: [phone]

## 2020-03-05 NOTE — Discharge Instructions (Signed)
Femoral Site Care This sheet gives you information about how to care for yourself after your procedure. Your health care provider may also give you more specific instructions. If you have problems or questions, contact your health care provider. What can I expect after the procedure? After the procedure, it is common to have:  Bruising that usually fades within 1-2 weeks.  Tenderness at the site. Follow these instructions at home: Wound care  Follow instructions from your health care provider about how to take care of your insertion site. Make sure you: ? Wash your hands with soap and water before you change your bandage (dressing). If soap and water are not available, use hand sanitizer. ? Change your dressing as told by your health care provider. ? Leave stitches (sutures), skin glue, or adhesive strips in place. These skin closures may need to stay in place for 2 weeks or longer. If adhesive strip edges start to loosen and curl up, you may trim the loose edges. Do not remove adhesive strips completely unless your health care provider tells you to do that.  Do not take baths, swim, or use a hot tub until your health care provider approves.  You may shower 24-48 hours after the procedure or as told by your health care provider. ? Gently wash the site with plain soap and water. ? Pat the area dry with a clean towel. ? Do not rub the site. This may cause bleeding.  Do not apply powder or lotion to the site. Keep the site clean and dry.  Check your femoral site every day for signs of infection. Check for: ? Redness, swelling, or pain. ? Fluid or blood. ? Warmth. ? Pus or a bad smell. Activity  For the first 2-3 days after your procedure, or as long as directed: ? Avoid climbing stairs as much as possible. ? Do not squat.  Do not lift anything that is heavier than 10 lb (4.5 kg), or the limit that you are told, until your health care provider says that it is safe.  Rest as  directed. ? Avoid sitting for a long time without moving. Get up to take short walks every 1-2 hours.  Do not drive for 24 hours if you were given a medicine to help you relax (sedative). General instructions  Take over-the-counter and prescription medicines only as told by your health care provider.  Keep all follow-up visits as told by your health care provider. This is important. Contact a health care provider if you have:  A fever or chills.  You have redness, swelling, or pain around your insertion site. Get help right away if:  The catheter insertion area swells very fast.  You pass out.  You suddenly start to sweat or your skin gets clammy.  The catheter insertion area is bleeding, and the bleeding does not stop when you hold steady pressure on the area.  The area near or just beyond the catheter insertion site becomes pale, cool, tingly, or numb. These symptoms may represent a serious problem that is an emergency. Do not wait to see if the symptoms will go away. Get medical help right away. Call your local emergency services (911 in the [place] Do not drive yourself to the hospital. Summary  After the procedure, it is common to have bruising that usually fades within 1-2 weeks.  Check your femoral site every day for signs of infection.  Do not lift anything that is heavier than 10 lb (4.5 kg), or the   limit that you are told, until your health care provider says that it is safe. This information is not intended to replace advice given to you by your health care provider. Make sure you discuss any questions you have with your health care provider. Document Revised: 12/21/2017 Document Reviewed: 12/21/2017 Elsevier Patient Education  [place].   Clopidogrel tablets What is this medicine? CLOPIDOGREL (kloh PID oh grel) helps to prevent blood clots. This medicine is used to prevent heart attack, stroke, or other vascular events in people who are at high risk. This  medicine may be used for other purposes; ask your health care provider or pharmacist if you have questions. COMMON BRAND NAME(S): Plavix What should I tell my health care provider before I take this medicine? They need to know if you have any of the following conditions:  bleeding disorders  bleeding in the brain  having surgery  history of stomach bleeding  an unusual or allergic reaction to clopidogrel, other medicines, foods, dyes, or preservatives  pregnant or trying to get pregnant  breast-feeding How should I use this medicine? Take this medicine by mouth with a glass of water. Follow the directions on the prescription label. You may take this medicine with or without food. If it upsets your stomach, take it with food. Take your medicine at regular intervals. Do not take it more often than directed. Do not stop taking except on your doctor's advice. A special MedGuide will be given to you by the pharmacist with each prescription and refill. Be sure to read this information carefully each time. Talk to your pediatrician regarding the use of this medicine in children. Special care may be needed. Overdosage: If you think you have taken too much of this medicine contact a poison control center or emergency room at once. NOTE: This medicine is only for you. Do not share this medicine with others. What if I miss a dose? If you miss a dose, take it as soon as you can. If it is almost time for your next dose, take only that dose. Do not take double or extra doses. What may interact with this medicine? Do not take this medicine with the following medications:  dasabuvir; ombitasvir; paritaprevir; ritonavir  defibrotide  selexipag This medicine may also interact with the following medications:  certain medicines that treat or prevent blood clots like warfarin  narcotic medicines for pain  NSAIDs, medicines for pain and inflammation, like ibuprofen or  naproxen  repaglinide  SNRIs, medicines for depression, like desvenlafaxine, duloxetine, levomilnacipran, venlafaxine  SSRIs, medicines for depression, like citalopram, escitalopram, fluoxetine, fluvoxamine, paroxetine, sertraline  stomach acid blockers like cimetidine, esomeprazole, omeprazole This list may not describe all possible interactions. Give your health care provider a list of all the medicines, herbs, non-prescription drugs, or dietary supplements you use. Also tell them if you smoke, drink alcohol, or use illegal drugs. Some items may interact with your medicine. What should I watch for while using this medicine? Visit your doctor or health care professional for regular check-ups. Do not stop taking your medicine unless your doctor tells you to. Notify your doctor or health care professional and seek emergency treatment if you develop breathing problems; changes in vision; chest pain; severe, sudden headache; pain, swelling, warmth in the leg; trouble speaking; sudden numbness or weakness of the face, arm or leg. These can be signs that your condition has gotten worse. If you are going to have surgery or dental work, tell your doctor or health  care professional that you are taking this medicine. Certain genetic factors may reduce the effect of this medicine. Your doctor may use genetic tests to determine treatment. Only take aspirin if you are instructed to. Low doses of aspirin are used with this medicine to treat some conditions. Taking aspirin with this medicine can increase your risk of bleeding so you must be careful. Talk to your doctor or pharmacist if you have questions. What side effects may I notice from receiving this medicine? Side effects that you should report to your doctor or health care professional as soon as possible:  allergic reactions like skin rash, itching or hives, swelling of the face, lips, or tongue  signs and symptoms of bleeding such as bloody or black,  tarry stools; red or dark-brown urine; spitting up blood or brown material that looks like coffee grounds; red spots on the skin; unusual bruising or bleeding from the eye, gums, or nose  signs and symptoms of a blood clot such as breathing problems; changes in vision; chest pain; severe, sudden headache; pain, swelling, warmth in the leg; trouble speaking; sudden numbness or weakness of the face, arm or leg  signs and symptoms of low blood sugar such as feeling anxious; confusion; dizziness; increased hunger; unusually weak or tired; increased sweating; shakiness; cold, clammy skin; irritable; headache; blurred vision; fast heartbeat; loss of consciousness Side effects that usually do not require medical attention (report to your doctor or health care professional if they continue or are bothersome):  constipation  diarrhea  headache  upset stomach This list may not describe all possible side effects. Call your doctor for medical advice about side effects. You may report side effects to FDA at 1-800-FDA-1088. Where should I keep my medicine? Keep out of the reach of children. Store at room temperature of 59 to 86 degrees F (15 to 30 degrees C). Throw away any unused medicine after the expiration date. NOTE: This sheet is a summary. It may not cover all possible information. If you have questions about this medicine, talk to your doctor, pharmacist, or health care provider.  2020 Elsevier/Gold Standard (2018-05-10 15:03:38)   Rosuvastatin Tablets What is this medicine? ROSUVASTATIN (roe SOO va sta tin) is known as a HMG-CoA reductase inhibitor or 'statin'. It lowers cholesterol and triglycerides in the blood. This drug may also reduce the risk of heart attack, stroke, or other health problems in patients with risk factors for heart disease. Diet and lifestyle changes are often used with this drug. This medicine may be used for other purposes; ask your health care provider or pharmacist if  you have questions. COMMON BRAND NAME(S): Crestor What should I tell my health care provider before I take this medicine? They need to know if you have any of these conditions:  diabetes  if you often drink alcohol  history of stroke  kidney disease  liver disease  muscle aches or weakness  thyroid disease  an unusual or allergic reaction to rosuvastatin, other medicines, foods, dyes, or preservatives  pregnant or trying to get pregnant  breast-feeding How should I use this medicine? Take this medicine by mouth with a glass of water. Follow the directions on the prescription label. Do not cut, crush or chew this medicine. You can take this medicine with or without food. Take your doses at regular intervals. Do not take your medicine more often than directed. Talk to your pediatrician regarding the use of this medicine in children. While this drug may be prescribed for  children as young as 74 years old for selected conditions, precautions do apply. Overdosage: If you think you have taken too much of this medicine contact a poison control center or emergency room at once. NOTE: This medicine is only for you. Do not share this medicine with others. What if I miss a dose? If you miss a dose, take it as soon as you can. If your next dose is to be taken in less than 12 hours, then do not take the missed dose. Take the next dose at your regular time. Do not take double or extra doses. What may interact with this medicine? Do not take this medicine with any of the following medications:  herbal medicines like red yeast rice This medicine may also interact with the following medications:  alcohol  antacids containing aluminum hydroxide or magnesium hydroxide  cyclosporine  other medicines for high cholesterol  some medicines for HIV infection  warfarin This list may not describe all possible interactions. Give your health care provider a list of all the medicines, herbs,  non-prescription drugs, or dietary supplements you use. Also tell them if you smoke, drink alcohol, or use illegal drugs. Some items may interact with your medicine. What should I watch for while using this medicine? Visit your doctor or health care professional for regular check-ups. You may need regular tests to make sure your liver is working properly. Your health care professional may tell you to stop taking this medicine if you develop muscle problems. If your muscle problems do not go away after stopping this medicine, contact your health care professional. Do not become pregnant while taking this medicine. Women should inform their health care professional if they wish to become pregnant or think they might be pregnant. There is a potential for serious side effects to an unborn child. Talk to your health care professional or pharmacist for more information. Do not breast-feed an infant while taking this medicine. This medicine may increase blood sugar. Ask your healthcare provider if changes in diet or medicines are needed if you have diabetes. If you are going to need surgery or other procedure, tell your doctor that you are using this medicine. This drug is only part of a total heart-health program. Your doctor or a dietician can suggest a low-cholesterol and low-fat diet to help. Avoid alcohol and smoking, and keep a proper exercise schedule. This medicine may cause a decrease in Co-Enzyme Q-10. You should make sure that you get enough Co-Enzyme Q-10 while you are taking this medicine. Discuss the foods you eat and the vitamins you take with your health care professional. What side effects may I notice from receiving this medicine? Side effects that you should report to your doctor or health care professional as soon as possible:  allergic reactions like skin rash, itching or hives, swelling of the face, lips, or tongue  confusion  joint pain  loss of memory  redness, blistering,  peeling or loosening of the skin, including inside the mouth  signs and symptoms of high blood sugar such as being more thirsty or hungry or having to urinate more than normal. You may also feel very tired or have blurry vision.  signs and symptoms of muscle injury like dark urine; trouble passing urine or change in the amount of urine; unusually weak or tired; muscle pain or side or back pain  yellowing of the eyes or skin Side effects that usually do not require medical attention (report to your doctor or health care  professional if they continue or are bothersome):  constipation  diarrhea  dizziness  gas  headache  nausea  stomach pain  trouble sleeping  upset stomach This list may not describe all possible side effects. Call your doctor for medical advice about side effects. You may report side effects to FDA at 1-800-FDA-1088. Where should I keep my medicine? Keep out of the reach of children. Store at room temperature between 20 and 25 degrees C (68 and 77 degrees F). Keep container tightly closed (protect from moisture). Throw away any unused medicine after the expiration date. NOTE: This sheet is a summary. It may not cover all possible information. If you have questions about this medicine, talk to your doctor, pharmacist, or health care provider.  2020 Elsevier/Gold Standard (2018-09-30 08:25:08)

## 2020-03-05 NOTE — Progress Notes (Signed)
Discharge instructions reviewed with patient and daughter. Verbalized understanding.  

## 2020-03-08 DIAGNOSIS — Z4781 Encounter for orthopedic aftercare following surgical amputation: Secondary | ICD-10-CM | POA: Diagnosis not present

## 2020-03-08 DIAGNOSIS — R69 Illness, unspecified: Secondary | ICD-10-CM | POA: Diagnosis not present

## 2020-03-08 DIAGNOSIS — H9190 Unspecified hearing loss, unspecified ear: Secondary | ICD-10-CM | POA: Diagnosis not present

## 2020-03-08 DIAGNOSIS — I10 Essential (primary) hypertension: Secondary | ICD-10-CM | POA: Diagnosis not present

## 2020-03-08 DIAGNOSIS — E785 Hyperlipidemia, unspecified: Secondary | ICD-10-CM | POA: Diagnosis not present

## 2020-03-08 DIAGNOSIS — M1990 Unspecified osteoarthritis, unspecified site: Secondary | ICD-10-CM | POA: Diagnosis not present

## 2020-03-08 DIAGNOSIS — R413 Other amnesia: Secondary | ICD-10-CM | POA: Diagnosis not present

## 2020-03-08 DIAGNOSIS — I4891 Unspecified atrial fibrillation: Secondary | ICD-10-CM | POA: Diagnosis not present

## 2020-03-08 DIAGNOSIS — C679 Malignant neoplasm of bladder, unspecified: Secondary | ICD-10-CM | POA: Diagnosis not present

## 2020-03-08 DIAGNOSIS — K219 Gastro-esophageal reflux disease without esophagitis: Secondary | ICD-10-CM | POA: Diagnosis not present

## 2020-03-08 DIAGNOSIS — E1151 Type 2 diabetes mellitus with diabetic peripheral angiopathy without gangrene: Secondary | ICD-10-CM | POA: Diagnosis not present

## 2020-03-09 DIAGNOSIS — C679 Malignant neoplasm of bladder, unspecified: Secondary | ICD-10-CM | POA: Diagnosis not present

## 2020-03-09 DIAGNOSIS — R413 Other amnesia: Secondary | ICD-10-CM | POA: Diagnosis not present

## 2020-03-09 DIAGNOSIS — H9190 Unspecified hearing loss, unspecified ear: Secondary | ICD-10-CM | POA: Diagnosis not present

## 2020-03-09 DIAGNOSIS — Z4781 Encounter for orthopedic aftercare following surgical amputation: Secondary | ICD-10-CM | POA: Diagnosis not present

## 2020-03-09 DIAGNOSIS — E785 Hyperlipidemia, unspecified: Secondary | ICD-10-CM | POA: Diagnosis not present

## 2020-03-09 DIAGNOSIS — M1990 Unspecified osteoarthritis, unspecified site: Secondary | ICD-10-CM | POA: Diagnosis not present

## 2020-03-09 DIAGNOSIS — K219 Gastro-esophageal reflux disease without esophagitis: Secondary | ICD-10-CM | POA: Diagnosis not present

## 2020-03-09 DIAGNOSIS — E1151 Type 2 diabetes mellitus with diabetic peripheral angiopathy without gangrene: Secondary | ICD-10-CM | POA: Diagnosis not present

## 2020-03-09 DIAGNOSIS — I10 Essential (primary) hypertension: Secondary | ICD-10-CM | POA: Diagnosis not present

## 2020-03-09 DIAGNOSIS — I4891 Unspecified atrial fibrillation: Secondary | ICD-10-CM | POA: Diagnosis not present

## 2020-03-11 DIAGNOSIS — S78111A Complete traumatic amputation at level between right hip and knee, initial encounter: Secondary | ICD-10-CM | POA: Diagnosis not present

## 2020-03-12 ENCOUNTER — Ambulatory Visit: Payer: Medicare HMO

## 2020-03-12 ENCOUNTER — Ambulatory Visit: Payer: Medicare HMO | Attending: Internal Medicine

## 2020-03-12 DIAGNOSIS — R413 Other amnesia: Secondary | ICD-10-CM | POA: Diagnosis not present

## 2020-03-12 DIAGNOSIS — Z23 Encounter for immunization: Secondary | ICD-10-CM

## 2020-03-12 DIAGNOSIS — E785 Hyperlipidemia, unspecified: Secondary | ICD-10-CM | POA: Diagnosis not present

## 2020-03-12 DIAGNOSIS — E1151 Type 2 diabetes mellitus with diabetic peripheral angiopathy without gangrene: Secondary | ICD-10-CM | POA: Diagnosis not present

## 2020-03-12 DIAGNOSIS — H9190 Unspecified hearing loss, unspecified ear: Secondary | ICD-10-CM | POA: Diagnosis not present

## 2020-03-12 DIAGNOSIS — C679 Malignant neoplasm of bladder, unspecified: Secondary | ICD-10-CM | POA: Diagnosis not present

## 2020-03-12 DIAGNOSIS — I10 Essential (primary) hypertension: Secondary | ICD-10-CM | POA: Diagnosis not present

## 2020-03-12 DIAGNOSIS — M1990 Unspecified osteoarthritis, unspecified site: Secondary | ICD-10-CM | POA: Diagnosis not present

## 2020-03-12 DIAGNOSIS — I4891 Unspecified atrial fibrillation: Secondary | ICD-10-CM | POA: Diagnosis not present

## 2020-03-12 DIAGNOSIS — K219 Gastro-esophageal reflux disease without esophagitis: Secondary | ICD-10-CM | POA: Diagnosis not present

## 2020-03-12 DIAGNOSIS — Z4781 Encounter for orthopedic aftercare following surgical amputation: Secondary | ICD-10-CM | POA: Diagnosis not present

## 2020-03-12 NOTE — Progress Notes (Signed)
   Covid-[facility] Vaccination [facility]  Name:  BRINTON BRANDEL    MRN: [id number] DOB: 1927/11/11  03/12/2020  Mr. Chesnut was observed post Covid-19 immunization for 15 minutes without incident. He was provided with Vaccine Information Sheet and instruction to access the V-Safe system.   Mr. Santana was instructed to call 911 with any severe reactions post vaccine: Marland Kitchen Difficulty breathing  . Swelling of face and throat  . A fast heartbeat  . A bad rash all over body  . Dizziness and weakness   Immunizations Administered    Name Date Dose VIS Date Route   Pfizer COVID-19 Vaccine 03/12/2020 11:02 AM 0.3 mL 12/02/2019 Intramuscular   Manufacturer: [place]   Lot: [id number]   [place]: [id number]

## 2020-03-14 DIAGNOSIS — I10 Essential (primary) hypertension: Secondary | ICD-10-CM | POA: Diagnosis not present

## 2020-03-14 DIAGNOSIS — E1151 Type 2 diabetes mellitus with diabetic peripheral angiopathy without gangrene: Secondary | ICD-10-CM | POA: Diagnosis not present

## 2020-03-14 DIAGNOSIS — M1990 Unspecified osteoarthritis, unspecified site: Secondary | ICD-10-CM | POA: Diagnosis not present

## 2020-03-14 DIAGNOSIS — Z4781 Encounter for orthopedic aftercare following surgical amputation: Secondary | ICD-10-CM | POA: Diagnosis not present

## 2020-03-14 DIAGNOSIS — C679 Malignant neoplasm of bladder, unspecified: Secondary | ICD-10-CM | POA: Diagnosis not present

## 2020-03-14 DIAGNOSIS — E785 Hyperlipidemia, unspecified: Secondary | ICD-10-CM | POA: Diagnosis not present

## 2020-03-14 DIAGNOSIS — R413 Other amnesia: Secondary | ICD-10-CM | POA: Diagnosis not present

## 2020-03-14 DIAGNOSIS — I4891 Unspecified atrial fibrillation: Secondary | ICD-10-CM | POA: Diagnosis not present

## 2020-03-14 DIAGNOSIS — K219 Gastro-esophageal reflux disease without esophagitis: Secondary | ICD-10-CM | POA: Diagnosis not present

## 2020-03-14 DIAGNOSIS — H9190 Unspecified hearing loss, unspecified ear: Secondary | ICD-10-CM | POA: Diagnosis not present

## 2020-03-16 DIAGNOSIS — B029 Zoster without complications: Secondary | ICD-10-CM | POA: Diagnosis not present

## 2020-03-16 DIAGNOSIS — E1142 Type 2 diabetes mellitus with diabetic polyneuropathy: Secondary | ICD-10-CM | POA: Diagnosis not present

## 2020-03-16 DIAGNOSIS — M79604 Pain in right leg: Secondary | ICD-10-CM | POA: Diagnosis not present

## 2020-03-21 DIAGNOSIS — H9190 Unspecified hearing loss, unspecified ear: Secondary | ICD-10-CM | POA: Diagnosis not present

## 2020-03-21 DIAGNOSIS — E785 Hyperlipidemia, unspecified: Secondary | ICD-10-CM | POA: Diagnosis not present

## 2020-03-21 DIAGNOSIS — R413 Other amnesia: Secondary | ICD-10-CM | POA: Diagnosis not present

## 2020-03-21 DIAGNOSIS — I10 Essential (primary) hypertension: Secondary | ICD-10-CM | POA: Diagnosis not present

## 2020-03-21 DIAGNOSIS — K219 Gastro-esophageal reflux disease without esophagitis: Secondary | ICD-10-CM | POA: Diagnosis not present

## 2020-03-21 DIAGNOSIS — C679 Malignant neoplasm of bladder, unspecified: Secondary | ICD-10-CM | POA: Diagnosis not present

## 2020-03-21 DIAGNOSIS — M1990 Unspecified osteoarthritis, unspecified site: Secondary | ICD-10-CM | POA: Diagnosis not present

## 2020-03-21 DIAGNOSIS — Z4781 Encounter for orthopedic aftercare following surgical amputation: Secondary | ICD-10-CM | POA: Diagnosis not present

## 2020-03-21 DIAGNOSIS — I4891 Unspecified atrial fibrillation: Secondary | ICD-10-CM | POA: Diagnosis not present

## 2020-03-21 DIAGNOSIS — E1151 Type 2 diabetes mellitus with diabetic peripheral angiopathy without gangrene: Secondary | ICD-10-CM | POA: Diagnosis not present

## 2020-03-22 DIAGNOSIS — C679 Malignant neoplasm of bladder, unspecified: Secondary | ICD-10-CM | POA: Diagnosis not present

## 2020-03-22 DIAGNOSIS — R413 Other amnesia: Secondary | ICD-10-CM | POA: Diagnosis not present

## 2020-03-22 DIAGNOSIS — Z4781 Encounter for orthopedic aftercare following surgical amputation: Secondary | ICD-10-CM | POA: Diagnosis not present

## 2020-03-22 DIAGNOSIS — I10 Essential (primary) hypertension: Secondary | ICD-10-CM | POA: Diagnosis not present

## 2020-03-22 DIAGNOSIS — M1990 Unspecified osteoarthritis, unspecified site: Secondary | ICD-10-CM | POA: Diagnosis not present

## 2020-03-22 DIAGNOSIS — E785 Hyperlipidemia, unspecified: Secondary | ICD-10-CM | POA: Diagnosis not present

## 2020-03-22 DIAGNOSIS — I4891 Unspecified atrial fibrillation: Secondary | ICD-10-CM | POA: Diagnosis not present

## 2020-03-22 DIAGNOSIS — K219 Gastro-esophageal reflux disease without esophagitis: Secondary | ICD-10-CM | POA: Diagnosis not present

## 2020-03-22 DIAGNOSIS — E1151 Type 2 diabetes mellitus with diabetic peripheral angiopathy without gangrene: Secondary | ICD-10-CM | POA: Diagnosis not present

## 2020-03-22 DIAGNOSIS — H9190 Unspecified hearing loss, unspecified ear: Secondary | ICD-10-CM | POA: Diagnosis not present

## 2020-03-28 DIAGNOSIS — I4891 Unspecified atrial fibrillation: Secondary | ICD-10-CM | POA: Diagnosis not present

## 2020-03-28 DIAGNOSIS — K219 Gastro-esophageal reflux disease without esophagitis: Secondary | ICD-10-CM | POA: Diagnosis not present

## 2020-03-28 DIAGNOSIS — Z4781 Encounter for orthopedic aftercare following surgical amputation: Secondary | ICD-10-CM | POA: Diagnosis not present

## 2020-03-28 DIAGNOSIS — C679 Malignant neoplasm of bladder, unspecified: Secondary | ICD-10-CM | POA: Diagnosis not present

## 2020-03-28 DIAGNOSIS — I10 Essential (primary) hypertension: Secondary | ICD-10-CM | POA: Diagnosis not present

## 2020-03-28 DIAGNOSIS — R413 Other amnesia: Secondary | ICD-10-CM | POA: Diagnosis not present

## 2020-03-28 DIAGNOSIS — E1151 Type 2 diabetes mellitus with diabetic peripheral angiopathy without gangrene: Secondary | ICD-10-CM | POA: Diagnosis not present

## 2020-03-28 DIAGNOSIS — E785 Hyperlipidemia, unspecified: Secondary | ICD-10-CM | POA: Diagnosis not present

## 2020-03-28 DIAGNOSIS — H9190 Unspecified hearing loss, unspecified ear: Secondary | ICD-10-CM | POA: Diagnosis not present

## 2020-03-28 DIAGNOSIS — M1990 Unspecified osteoarthritis, unspecified site: Secondary | ICD-10-CM | POA: Diagnosis not present

## 2020-03-29 DIAGNOSIS — K219 Gastro-esophageal reflux disease without esophagitis: Secondary | ICD-10-CM | POA: Diagnosis not present

## 2020-03-29 DIAGNOSIS — E1151 Type 2 diabetes mellitus with diabetic peripheral angiopathy without gangrene: Secondary | ICD-10-CM | POA: Diagnosis not present

## 2020-03-29 DIAGNOSIS — C679 Malignant neoplasm of bladder, unspecified: Secondary | ICD-10-CM | POA: Diagnosis not present

## 2020-03-29 DIAGNOSIS — I10 Essential (primary) hypertension: Secondary | ICD-10-CM | POA: Diagnosis not present

## 2020-03-29 DIAGNOSIS — Z4781 Encounter for orthopedic aftercare following surgical amputation: Secondary | ICD-10-CM | POA: Diagnosis not present

## 2020-03-29 DIAGNOSIS — R413 Other amnesia: Secondary | ICD-10-CM | POA: Diagnosis not present

## 2020-03-29 DIAGNOSIS — H9190 Unspecified hearing loss, unspecified ear: Secondary | ICD-10-CM | POA: Diagnosis not present

## 2020-03-29 DIAGNOSIS — M1990 Unspecified osteoarthritis, unspecified site: Secondary | ICD-10-CM | POA: Diagnosis not present

## 2020-03-29 DIAGNOSIS — E785 Hyperlipidemia, unspecified: Secondary | ICD-10-CM | POA: Diagnosis not present

## 2020-03-29 DIAGNOSIS — I4891 Unspecified atrial fibrillation: Secondary | ICD-10-CM | POA: Diagnosis not present

## 2020-04-03 DIAGNOSIS — C679 Malignant neoplasm of bladder, unspecified: Secondary | ICD-10-CM | POA: Diagnosis not present

## 2020-04-03 DIAGNOSIS — K219 Gastro-esophageal reflux disease without esophagitis: Secondary | ICD-10-CM | POA: Diagnosis not present

## 2020-04-03 DIAGNOSIS — M1990 Unspecified osteoarthritis, unspecified site: Secondary | ICD-10-CM | POA: Diagnosis not present

## 2020-04-03 DIAGNOSIS — Z4781 Encounter for orthopedic aftercare following surgical amputation: Secondary | ICD-10-CM | POA: Diagnosis not present

## 2020-04-03 DIAGNOSIS — E1151 Type 2 diabetes mellitus with diabetic peripheral angiopathy without gangrene: Secondary | ICD-10-CM | POA: Diagnosis not present

## 2020-04-03 DIAGNOSIS — H9190 Unspecified hearing loss, unspecified ear: Secondary | ICD-10-CM | POA: Diagnosis not present

## 2020-04-03 DIAGNOSIS — R413 Other amnesia: Secondary | ICD-10-CM | POA: Diagnosis not present

## 2020-04-03 DIAGNOSIS — I4891 Unspecified atrial fibrillation: Secondary | ICD-10-CM | POA: Diagnosis not present

## 2020-04-03 DIAGNOSIS — I10 Essential (primary) hypertension: Secondary | ICD-10-CM | POA: Diagnosis not present

## 2020-04-03 DIAGNOSIS — E785 Hyperlipidemia, unspecified: Secondary | ICD-10-CM | POA: Diagnosis not present

## 2020-04-04 ENCOUNTER — Ambulatory Visit: Payer: Medicare HMO | Attending: Internal Medicine

## 2020-04-04 DIAGNOSIS — I10 Essential (primary) hypertension: Secondary | ICD-10-CM | POA: Diagnosis not present

## 2020-04-04 DIAGNOSIS — E785 Hyperlipidemia, unspecified: Secondary | ICD-10-CM | POA: Diagnosis not present

## 2020-04-04 DIAGNOSIS — I4891 Unspecified atrial fibrillation: Secondary | ICD-10-CM | POA: Diagnosis not present

## 2020-04-04 DIAGNOSIS — K219 Gastro-esophageal reflux disease without esophagitis: Secondary | ICD-10-CM | POA: Diagnosis not present

## 2020-04-04 DIAGNOSIS — E1151 Type 2 diabetes mellitus with diabetic peripheral angiopathy without gangrene: Secondary | ICD-10-CM | POA: Diagnosis not present

## 2020-04-04 DIAGNOSIS — Z4781 Encounter for orthopedic aftercare following surgical amputation: Secondary | ICD-10-CM | POA: Diagnosis not present

## 2020-04-04 DIAGNOSIS — H9190 Unspecified hearing loss, unspecified ear: Secondary | ICD-10-CM | POA: Diagnosis not present

## 2020-04-04 DIAGNOSIS — R413 Other amnesia: Secondary | ICD-10-CM | POA: Diagnosis not present

## 2020-04-04 DIAGNOSIS — M1990 Unspecified osteoarthritis, unspecified site: Secondary | ICD-10-CM | POA: Diagnosis not present

## 2020-04-04 DIAGNOSIS — C679 Malignant neoplasm of bladder, unspecified: Secondary | ICD-10-CM | POA: Diagnosis not present

## 2020-04-04 DIAGNOSIS — Z23 Encounter for immunization: Secondary | ICD-10-CM

## 2020-04-04 NOTE — Progress Notes (Signed)
   Covid-19 Vaccination Clinic  Name:  WHEELER INCORVAIA    MRN: [id number] DOB: 11/06/1927  04/04/2020  Mr. Kilbourne was observed post Covid-19 immunization for 15 minutes without incident. He was provided with Vaccine Information Sheet and instruction to access the V-Safe system.   Mr. Tisdel was instructed to call 911 with any severe reactions post vaccine: Marland Kitchen Difficulty breathing  . Swelling of face and throat  . A fast heartbeat  . A bad rash all over body  . Dizziness and weakness   Immunizations Administered    Name Date Dose VIS Date Route   Pfizer COVID-19 Vaccine 04/04/2020 11:29 AM 0.3 mL 12/02/2019 Intramuscular   Manufacturer: [place]   Lot: [id number]   [place]: [id number]

## 2020-04-09 ENCOUNTER — Other Ambulatory Visit: Payer: Self-pay | Admitting: *Deleted

## 2020-04-09 DIAGNOSIS — I739 Peripheral vascular disease, unspecified: Secondary | ICD-10-CM

## 2020-04-11 DIAGNOSIS — S78111A Complete traumatic amputation at level between right hip and knee, initial encounter: Secondary | ICD-10-CM | POA: Diagnosis not present

## 2020-04-12 ENCOUNTER — Telehealth (HOSPITAL_COMMUNITY): Payer: Self-pay

## 2020-04-12 DIAGNOSIS — R300 Dysuria: Secondary | ICD-10-CM | POA: Diagnosis not present

## 2020-04-12 DIAGNOSIS — N183 Chronic kidney disease, stage 3 unspecified: Secondary | ICD-10-CM | POA: Diagnosis not present

## 2020-04-12 DIAGNOSIS — Z125 Encounter for screening for malignant neoplasm of prostate: Secondary | ICD-10-CM | POA: Diagnosis not present

## 2020-04-12 DIAGNOSIS — N3001 Acute cystitis with hematuria: Secondary | ICD-10-CM | POA: Diagnosis not present

## 2020-04-12 NOTE — Telephone Encounter (Signed)

## 2020-04-13 ENCOUNTER — Ambulatory Visit (INDEPENDENT_AMBULATORY_CARE_PROVIDER_SITE_OTHER): Payer: Medicare HMO | Admitting: Physician Assistant

## 2020-04-13 ENCOUNTER — Other Ambulatory Visit: Payer: Self-pay

## 2020-04-13 ENCOUNTER — Ambulatory Visit (INDEPENDENT_AMBULATORY_CARE_PROVIDER_SITE_OTHER)
Admission: RE | Admit: 2020-04-13 | Discharge: 2020-04-13 | Disposition: A | Discharge status: Home / Self Care | Payer: Medicare HMO | Source: Ambulatory Visit | Attending: Vascular Surgery | Admitting: Vascular Surgery

## 2020-04-13 ENCOUNTER — Ambulatory Visit (HOSPITAL_COMMUNITY)
Admission: RE | Admit: 2020-04-13 | Discharge: 2020-04-13 | Disposition: A | Discharge status: Home / Self Care | Payer: Medicare HMO | Source: Ambulatory Visit | Attending: Vascular Surgery | Admitting: Vascular Surgery

## 2020-04-13 VITALS — BP 182/95 | HR 81 | Resp 14

## 2020-04-13 DIAGNOSIS — E785 Hyperlipidemia, unspecified: Secondary | ICD-10-CM | POA: Diagnosis not present

## 2020-04-13 DIAGNOSIS — C679 Malignant neoplasm of bladder, unspecified: Secondary | ICD-10-CM | POA: Diagnosis not present

## 2020-04-13 DIAGNOSIS — R413 Other amnesia: Secondary | ICD-10-CM | POA: Diagnosis not present

## 2020-04-13 DIAGNOSIS — I10 Essential (primary) hypertension: Secondary | ICD-10-CM | POA: Diagnosis not present

## 2020-04-13 DIAGNOSIS — I739 Peripheral vascular disease, unspecified: Secondary | ICD-10-CM

## 2020-04-13 DIAGNOSIS — M1990 Unspecified osteoarthritis, unspecified site: Secondary | ICD-10-CM | POA: Diagnosis not present

## 2020-04-13 DIAGNOSIS — H9190 Unspecified hearing loss, unspecified ear: Secondary | ICD-10-CM | POA: Diagnosis not present

## 2020-04-13 DIAGNOSIS — Z4781 Encounter for orthopedic aftercare following surgical amputation: Secondary | ICD-10-CM | POA: Diagnosis not present

## 2020-04-13 DIAGNOSIS — K219 Gastro-esophageal reflux disease without esophagitis: Secondary | ICD-10-CM | POA: Diagnosis not present

## 2020-04-13 DIAGNOSIS — I4891 Unspecified atrial fibrillation: Secondary | ICD-10-CM | POA: Diagnosis not present

## 2020-04-13 DIAGNOSIS — E1151 Type 2 diabetes mellitus with diabetic peripheral angiopathy without gangrene: Secondary | ICD-10-CM | POA: Diagnosis not present

## 2020-04-13 NOTE — Progress Notes (Addendum)
Office Note     CC:  follow up Requesting Provider:  Antony Contras, MD  HPI: Henry Wiggins is a 86 y.o. ([birth year]) male who presents a history of occlusive peripheral artery disease.  He has had previous history of non-healing bilateral foot ulcers.  He underwent right AKA on 12/29/2019 due to non-healing right foot ulcer and after further revascularization procedures were exhausted.  Most recently, he underwent left TP trunk and peroneal artery artherectomies and placement of drug-eluting stents by Dr. Donzetta Matters on 03/05/2020.  The patient is accompanied today by his daughter with whom he lives and is his full-time caregiver.  She states Occupational Therapy is to visit today.  He is able to make transfers with assistance.  He denies lower extremity pain or rest pain.  PMH significant for prior CVA and dementia.  The pt is not on a statin for cholesterol management. (not tolerated well per daughter) The pt is not on a daily aspirin.   Other AC:  apixaban, Plavix The pt is on diuretic for hypertension.   The pt is diabetic.  insulin Tobacco hx:  Quit 1955  Past Medical History:  Diagnosis Date  . Arthritis   . Bladder cancer (HCC)    Dr. Janice Norrie  . Depression   . Diabetes ([place])    type 2  . Family history of adverse reaction to anesthesia    Daughter stated that she is difficult to wake up.slow to wake up  . Full dentures   . GERD (gastroesophageal reflux disease)   . HOH (hard of hearing)   . Hyperlipidemia   . Hypertension   . Memory loss   . Stroke ([place])   . UTI (lower urinary tract infection)     Past Surgical History:  Procedure Laterality Date  . ABDOMINAL AORTOGRAM W/LOWER EXTREMITY Bilateral 12/19/2019   Procedure: ABDOMINAL AORTOGRAM W/LOWER EXTREMITY;  Surgeon: Waynetta Sandy, MD;  Location: [facility];  Service: Cardiovascular;  Laterality: Bilateral;  . ABDOMINAL AORTOGRAM W/LOWER EXTREMITY Left 03/05/2020   Procedure: ABDOMINAL AORTOGRAM W/LOWER  EXTREMITY;  Surgeon: Waynetta Sandy, MD;  Location: [facility];  Service: Cardiovascular;  Laterality: Left;  . AMPUTATION Right 12/29/2019   Procedure: AMPUTATION ABOVE KNEE;  Surgeon: Waynetta Sandy, MD;  Location: [facility];  Service: Vascular;  Laterality: Right;  . APPENDECTOMY    . CARPAL TUNNEL RELEASE Right   . CATARACT EXTRACTION Bilateral   . CHOLECYSTECTOMY    . COLONOSCOPY    . ENDOVASCULAR REPAIR/STENT GRAFT Left 03/05/2020   Procedure: ENDOVASCULAR REPAIR/STENT GRAFT;  Surgeon: Waynetta Sandy, MD;  Location: [facility];  Service: Cardiovascular;  Laterality: Left;  PERONEAL  . EP IMPLANTABLE DEVICE N/A 05/28/2015   Procedure: Loop Recorder Insertion;  Surgeon: Sanda Klein, MD;  Location: [facility];  Service: Cardiovascular;  Laterality: N/A;  . HERNIA REPAIR     x4  . MULTIPLE TOOTH EXTRACTIONS    . PERIPHERAL VASCULAR ATHERECTOMY Left 03/05/2020   Procedure: PERIPHERAL VASCULAR ATHERECTOMY;  Surgeon: Waynetta Sandy, MD;  Location: [facility];  Service: Cardiovascular;  Laterality: Left;  PERONEAL  . PERIPHERAL VASCULAR BALLOON ANGIOPLASTY Right 12/19/2019   Procedure: PERIPHERAL VASCULAR BALLOON ANGIOPLASTY;  Surgeon: Waynetta Sandy, MD;  Location: [facility];  Service: Cardiovascular;  Laterality: Right;  Anterior tibial artery  . TEE WITHOUT CARDIOVERSION N/A 05/28/2015   Procedure: TRANSESOPHAGEAL ECHOCARDIOGRAM (TEE);  Surgeon: Sanda Klein, MD;  Location: [facility];  Service: Cardiovascular;  Laterality: N/A;  . TONSILLECTOMY      Social History   Socioeconomic History  . Marital status: Married    Spouse name: Not on file  . Number of children: 5  . Years of education: 10  . Highest education level: Not on file  Occupational History  . Occupation: Retired  Tobacco Use  . Smoking status: Former Smoker    Packs/day: 0.50    Years: 5.00    Pack years: 2.50    Types:  Cigarettes    Quit date: 12/22/1953    Years since quitting: 66.3  . Smokeless tobacco: Never Used  Substance and Sexual Activity  . Alcohol use: Not Currently    Alcohol/week: 0.0 standard drinks  . Drug use: Yes    Types: Oxycodone  . Sexual activity: Not on file  Other Topics Concern  . Not on file  Social History Narrative   Lives at home with his wife.   Right-handed.   2 cups caffeine per day.   Social Determinants of Health   Financial Resource Strain:   . Difficulty of Paying Living Expenses:   Food Insecurity:   . Worried About Charity fundraiser in the Last Year:   . Arboriculturist in the Last Year:   Transportation Needs:   . Film/video editor (Medical):   Marland Kitchen Lack of Transportation (Non-Medical):   Physical Activity:   . Days of Exercise per Week:   . Minutes of Exercise per Session:   Stress:   . Feeling of Stress :   Social Connections:   . Frequency of Communication with Friends and Family:   . Frequency of Social Gatherings with Friends and Family:   . Attends Religious Services:   . Active Member of Clubs or Organizations:   . Attends Archivist Meetings:   Marland Kitchen Marital Status:   Intimate Partner Violence:   . Fear of Current or Ex-Partner:   . Emotionally Abused:   Marland Kitchen Physically Abused:   . Sexually Abused:     Family History  Problem Relation Age of Onset  . Hypertension Mother   . Hypertension Father     Current Outpatient Medications  Medication Sig Dispense Refill  . apixaban (ELIQUIS) 2.5 MG TABS tablet Take 1 tablet (2.5 mg total) by mouth 2 (two) times daily. 60 tablet 2  . blood glucose meter kit and supplies Dispense based on patient and insurance preference. Use up to four times daily as directed. (FOR ICD-10 E10.9, E11.9). 1 each 0  . Cholecalciferol (VITAMIN D) 50 MCG (2000 UT) tablet Take 2,000 Units by mouth daily.    . clopidogrel (PLAVIX) 75 MG tablet Take 1 tablet (75 mg total) by mouth daily. 30 tablet 11  .  furosemide (LASIX) 40 MG tablet Take 1 tablet (40 mg total) by mouth daily. 30 tablet 3  . gabapentin (NEURONTIN) 100 MG capsule Take 100 mg by mouth 2 (two) times daily.     Marland Kitchen linagliptin (TRADJENTA) 5 MG TABS tablet Take 1 tablet (5 mg total) by mouth daily. 30 tablet 0  . Multiple Vitamins-Minerals (PRESERVISION AREDS 2) CAPS Take 1 tablet by mouth daily.    Glory Rosebush VERIO test strip FOR USE WHEN CHECKING BLOOD GLUCOSE ONCE DAILY, ALTERNATING AM & PM, BEFORE MEALS E11.69    . rosuvastatin (CRESTOR) 10 MG tablet Take 1 tablet (10 mg total) by mouth at bedtime. 30 tablet 11  . traMADol (ULTRAM) 50 MG tablet Take  1 tablet (50 mg total) by mouth every 6 (six) hours as needed. (Patient not taking: Reported on 02/29/2020) 20 tablet 0   No current facility-administered medications for this visit.    No Known Allergies   REVIEW OF SYSTEMS:   [X]  denotes positive finding, [ ]  denotes negative finding Cardiac  Comments:  Chest pain or chest pressure:    Shortness of breath upon exertion:    Short of breath when lying flat:    Irregular heart rhythm:        Vascular    Pain in calf, thigh, or hip brought on by ambulation:    Pain in feet at night that wakes you up from your sleep:     Blood clot in your veins:    Leg swelling:         Pulmonary    Oxygen at home:    Productive cough:     Wheezing:         Neurologic    Sudden weakness in arms or legs:     Sudden numbness in arms or legs:     Sudden onset of difficulty speaking or slurred speech:    Temporary loss of vision in one eye:     Problems with dizziness:         Gastrointestinal    Blood in stool:     Vomited blood:         Genitourinary    Burning when urinating:     Blood in urine:        Psychiatric    Major depression:         Hematologic    Bleeding problems:    Problems with blood clotting too easily:        Skin    Rashes or ulcers:        Constitutional    Fever or chills:      PHYSICAL  EXAMINATION:  There were no vitals filed for this visit.   General:  WDWN in NAD; vital signs documented above Gait: Not observed HENT: WNL, normocephalic Pulmonary: normal non-labored breathing , without Rales, rhonchi,  wheezing Cardiac: regular HR, without  Murmurs with left carotid bruit Skin: without rashes Vascular Exam/Pulses: The patient has 2+ palpable brachial, radial pulses bilaterally.  Left pedal pulses are not palpable.  His left foot is warm and well perfused with mild pitting edema.  Previous ulcers of the left first, third and fourth toes are healed. Extremities: with chronic ischemic changes demonstrated by shiny skin and lack of hair., without Gangrene , without cellulitis; without open wounds; right AKA incision is well-healed Musculoskeletal: no muscle wasting or atrophy  Neurologic: A&O X 3;  No focal weakness or paresthesias are detected Psychiatric:  The pt has Normal affect.        Non-Invasive Vascular Imaging:   Left:50-74% stenosis noted in segments of the superficial femoral artery.  Patent stent with no evidence for stenosis.    [Arteriogram 03/05/2020: We performed contrasted angiography of the SFA there did not appear to be any flow-limiting although multiple nonflow limiting stenoses throughout.]    ABI/TBIToday's ABIToday's TBIPrevious ABIPrevious TBI  +-------+-----------+-----------+------------+------------+  Right AKA    -                   +-------+-----------+-----------+------------+------------+  Left  0.94    0.45                  +-------+-----------+-----------+------------+------------+  ASSESSMENT/PLAN:: 86 y.o. male  here for follow up for lower extremity occlusive peripheral vascular disease.  The patient is asymptomatic and his ulcers have healed.  Left ABI likely falsely elevated.  Discussed physical exam findings and noninvasive vascular imaging with the patient and his  daughter. Explained the patient is released to perform activities as as tolerated as long as he is assisted and follows physical therapy and Occupational Therapy recommendations.  Continue Eliquis and Plavix.  Follow-up in 6 months or sooner if the patient begins to experience rest pain or skin issues/return of ulceration.   Barbie Banner, PA-C [facility] [phone]  Clinic MD:   Donzetta Matters  Addendum:   Above knee prosthesis needed for short household ambulation and transfers.  Patient is a K1 ambulator with limited ambulation.  Servando Snare, MD

## 2020-04-17 ENCOUNTER — Other Ambulatory Visit: Payer: Self-pay | Admitting: *Deleted

## 2020-04-17 DIAGNOSIS — E1151 Type 2 diabetes mellitus with diabetic peripheral angiopathy without gangrene: Secondary | ICD-10-CM | POA: Diagnosis not present

## 2020-04-17 DIAGNOSIS — I10 Essential (primary) hypertension: Secondary | ICD-10-CM | POA: Diagnosis not present

## 2020-04-17 DIAGNOSIS — K219 Gastro-esophageal reflux disease without esophagitis: Secondary | ICD-10-CM | POA: Diagnosis not present

## 2020-04-17 DIAGNOSIS — I4891 Unspecified atrial fibrillation: Secondary | ICD-10-CM | POA: Diagnosis not present

## 2020-04-17 DIAGNOSIS — C679 Malignant neoplasm of bladder, unspecified: Secondary | ICD-10-CM | POA: Diagnosis not present

## 2020-04-17 DIAGNOSIS — M1990 Unspecified osteoarthritis, unspecified site: Secondary | ICD-10-CM | POA: Diagnosis not present

## 2020-04-17 DIAGNOSIS — R413 Other amnesia: Secondary | ICD-10-CM | POA: Diagnosis not present

## 2020-04-17 DIAGNOSIS — I739 Peripheral vascular disease, unspecified: Secondary | ICD-10-CM

## 2020-04-17 DIAGNOSIS — Z4781 Encounter for orthopedic aftercare following surgical amputation: Secondary | ICD-10-CM | POA: Diagnosis not present

## 2020-04-17 DIAGNOSIS — H9190 Unspecified hearing loss, unspecified ear: Secondary | ICD-10-CM | POA: Diagnosis not present

## 2020-04-17 DIAGNOSIS — E785 Hyperlipidemia, unspecified: Secondary | ICD-10-CM | POA: Diagnosis not present

## 2020-04-18 DIAGNOSIS — C679 Malignant neoplasm of bladder, unspecified: Secondary | ICD-10-CM | POA: Diagnosis not present

## 2020-04-18 DIAGNOSIS — H9190 Unspecified hearing loss, unspecified ear: Secondary | ICD-10-CM | POA: Diagnosis not present

## 2020-04-18 DIAGNOSIS — R413 Other amnesia: Secondary | ICD-10-CM | POA: Diagnosis not present

## 2020-04-18 DIAGNOSIS — M1990 Unspecified osteoarthritis, unspecified site: Secondary | ICD-10-CM | POA: Diagnosis not present

## 2020-04-18 DIAGNOSIS — I10 Essential (primary) hypertension: Secondary | ICD-10-CM | POA: Diagnosis not present

## 2020-04-18 DIAGNOSIS — E785 Hyperlipidemia, unspecified: Secondary | ICD-10-CM | POA: Diagnosis not present

## 2020-04-18 DIAGNOSIS — K219 Gastro-esophageal reflux disease without esophagitis: Secondary | ICD-10-CM | POA: Diagnosis not present

## 2020-04-18 DIAGNOSIS — I4891 Unspecified atrial fibrillation: Secondary | ICD-10-CM | POA: Diagnosis not present

## 2020-04-18 DIAGNOSIS — E1151 Type 2 diabetes mellitus with diabetic peripheral angiopathy without gangrene: Secondary | ICD-10-CM | POA: Diagnosis not present

## 2020-04-18 DIAGNOSIS — Z4781 Encounter for orthopedic aftercare following surgical amputation: Secondary | ICD-10-CM | POA: Diagnosis not present

## 2020-04-25 DIAGNOSIS — R3914 Feeling of incomplete bladder emptying: Secondary | ICD-10-CM | POA: Diagnosis not present

## 2020-04-25 DIAGNOSIS — N390 Urinary tract infection, site not specified: Secondary | ICD-10-CM | POA: Diagnosis not present

## 2020-04-25 DIAGNOSIS — N401 Enlarged prostate with lower urinary tract symptoms: Secondary | ICD-10-CM | POA: Diagnosis not present

## 2020-04-25 DIAGNOSIS — R351 Nocturia: Secondary | ICD-10-CM | POA: Diagnosis not present

## 2020-04-26 DIAGNOSIS — C679 Malignant neoplasm of bladder, unspecified: Secondary | ICD-10-CM | POA: Diagnosis not present

## 2020-04-26 DIAGNOSIS — H9190 Unspecified hearing loss, unspecified ear: Secondary | ICD-10-CM | POA: Diagnosis not present

## 2020-04-26 DIAGNOSIS — M1990 Unspecified osteoarthritis, unspecified site: Secondary | ICD-10-CM | POA: Diagnosis not present

## 2020-04-26 DIAGNOSIS — Z4781 Encounter for orthopedic aftercare following surgical amputation: Secondary | ICD-10-CM | POA: Diagnosis not present

## 2020-04-26 DIAGNOSIS — I10 Essential (primary) hypertension: Secondary | ICD-10-CM | POA: Diagnosis not present

## 2020-04-26 DIAGNOSIS — E785 Hyperlipidemia, unspecified: Secondary | ICD-10-CM | POA: Diagnosis not present

## 2020-04-26 DIAGNOSIS — I4891 Unspecified atrial fibrillation: Secondary | ICD-10-CM | POA: Diagnosis not present

## 2020-04-26 DIAGNOSIS — K219 Gastro-esophageal reflux disease without esophagitis: Secondary | ICD-10-CM | POA: Diagnosis not present

## 2020-04-26 DIAGNOSIS — E1151 Type 2 diabetes mellitus with diabetic peripheral angiopathy without gangrene: Secondary | ICD-10-CM | POA: Diagnosis not present

## 2020-04-26 DIAGNOSIS — R413 Other amnesia: Secondary | ICD-10-CM | POA: Diagnosis not present

## 2020-04-27 ENCOUNTER — Other Ambulatory Visit: Payer: Self-pay

## 2020-04-27 DIAGNOSIS — E785 Hyperlipidemia, unspecified: Secondary | ICD-10-CM | POA: Diagnosis not present

## 2020-04-27 DIAGNOSIS — R413 Other amnesia: Secondary | ICD-10-CM | POA: Diagnosis not present

## 2020-04-27 DIAGNOSIS — K219 Gastro-esophageal reflux disease without esophagitis: Secondary | ICD-10-CM | POA: Diagnosis not present

## 2020-04-27 DIAGNOSIS — I10 Essential (primary) hypertension: Secondary | ICD-10-CM | POA: Diagnosis not present

## 2020-04-27 DIAGNOSIS — E1151 Type 2 diabetes mellitus with diabetic peripheral angiopathy without gangrene: Secondary | ICD-10-CM | POA: Diagnosis not present

## 2020-04-27 DIAGNOSIS — M1990 Unspecified osteoarthritis, unspecified site: Secondary | ICD-10-CM | POA: Diagnosis not present

## 2020-04-27 DIAGNOSIS — H9190 Unspecified hearing loss, unspecified ear: Secondary | ICD-10-CM | POA: Diagnosis not present

## 2020-04-27 DIAGNOSIS — C679 Malignant neoplasm of bladder, unspecified: Secondary | ICD-10-CM | POA: Diagnosis not present

## 2020-04-27 DIAGNOSIS — Z4781 Encounter for orthopedic aftercare following surgical amputation: Secondary | ICD-10-CM | POA: Diagnosis not present

## 2020-04-27 DIAGNOSIS — I4891 Unspecified atrial fibrillation: Secondary | ICD-10-CM | POA: Diagnosis not present

## 2020-04-27 MED ORDER — FUROSEMIDE 40 MG PO TABS: 40.0000 mg | ORAL_TABLET | Freq: Every day | ORAL | 0 refills | Status: DC

## 2020-04-30 ENCOUNTER — Other Ambulatory Visit: Payer: Self-pay

## 2020-04-30 MED ORDER — APIXABAN 2.5 MG PO TABS: 2.5000 mg | ORAL_TABLET | Freq: Two times a day (BID) | ORAL | 1 refills | Status: DC

## 2020-04-30 NOTE — Telephone Encounter (Signed)
Scr 1.4 03/05/20 38m 90.7kg Pt requesting 2.5mg  eliquis refill but based on dosing guidelines he qualified for 5mg ... will route to the pharmd pool

## 2020-04-30 NOTE — Telephone Encounter (Signed)
Most recent SCr 1.4, however for past several months has been 1.9-2.0 range.  Will continue on 2.5 mg dose x 6 months then review again

## 2020-05-02 DIAGNOSIS — I4891 Unspecified atrial fibrillation: Secondary | ICD-10-CM | POA: Diagnosis not present

## 2020-05-02 DIAGNOSIS — Z4781 Encounter for orthopedic aftercare following surgical amputation: Secondary | ICD-10-CM | POA: Diagnosis not present

## 2020-05-02 DIAGNOSIS — H9190 Unspecified hearing loss, unspecified ear: Secondary | ICD-10-CM | POA: Diagnosis not present

## 2020-05-02 DIAGNOSIS — E785 Hyperlipidemia, unspecified: Secondary | ICD-10-CM | POA: Diagnosis not present

## 2020-05-02 DIAGNOSIS — K219 Gastro-esophageal reflux disease without esophagitis: Secondary | ICD-10-CM | POA: Diagnosis not present

## 2020-05-02 DIAGNOSIS — I10 Essential (primary) hypertension: Secondary | ICD-10-CM | POA: Diagnosis not present

## 2020-05-02 DIAGNOSIS — E1151 Type 2 diabetes mellitus with diabetic peripheral angiopathy without gangrene: Secondary | ICD-10-CM | POA: Diagnosis not present

## 2020-05-02 DIAGNOSIS — C679 Malignant neoplasm of bladder, unspecified: Secondary | ICD-10-CM | POA: Diagnosis not present

## 2020-05-02 DIAGNOSIS — M1990 Unspecified osteoarthritis, unspecified site: Secondary | ICD-10-CM | POA: Diagnosis not present

## 2020-05-02 DIAGNOSIS — R413 Other amnesia: Secondary | ICD-10-CM | POA: Diagnosis not present

## 2020-05-04 DIAGNOSIS — Z4781 Encounter for orthopedic aftercare following surgical amputation: Secondary | ICD-10-CM | POA: Diagnosis not present

## 2020-05-04 DIAGNOSIS — M1990 Unspecified osteoarthritis, unspecified site: Secondary | ICD-10-CM | POA: Diagnosis not present

## 2020-05-04 DIAGNOSIS — K219 Gastro-esophageal reflux disease without esophagitis: Secondary | ICD-10-CM | POA: Diagnosis not present

## 2020-05-04 DIAGNOSIS — I4891 Unspecified atrial fibrillation: Secondary | ICD-10-CM | POA: Diagnosis not present

## 2020-05-04 DIAGNOSIS — E1151 Type 2 diabetes mellitus with diabetic peripheral angiopathy without gangrene: Secondary | ICD-10-CM | POA: Diagnosis not present

## 2020-05-04 DIAGNOSIS — I10 Essential (primary) hypertension: Secondary | ICD-10-CM | POA: Diagnosis not present

## 2020-05-04 DIAGNOSIS — C679 Malignant neoplasm of bladder, unspecified: Secondary | ICD-10-CM | POA: Diagnosis not present

## 2020-05-04 DIAGNOSIS — E785 Hyperlipidemia, unspecified: Secondary | ICD-10-CM | POA: Diagnosis not present

## 2020-05-04 DIAGNOSIS — R413 Other amnesia: Secondary | ICD-10-CM | POA: Diagnosis not present

## 2020-05-04 DIAGNOSIS — H9190 Unspecified hearing loss, unspecified ear: Secondary | ICD-10-CM | POA: Diagnosis not present

## 2020-05-10 DIAGNOSIS — N184 Chronic kidney disease, stage 4 (severe): Secondary | ICD-10-CM | POA: Diagnosis not present

## 2020-05-10 DIAGNOSIS — E1169 Type 2 diabetes mellitus with other specified complication: Secondary | ICD-10-CM | POA: Diagnosis not present

## 2020-05-10 DIAGNOSIS — E1149 Type 2 diabetes mellitus with other diabetic neurological complication: Secondary | ICD-10-CM | POA: Diagnosis not present

## 2020-05-10 DIAGNOSIS — E782 Mixed hyperlipidemia: Secondary | ICD-10-CM | POA: Diagnosis not present

## 2020-05-10 DIAGNOSIS — N183 Chronic kidney disease, stage 3 unspecified: Secondary | ICD-10-CM | POA: Diagnosis not present

## 2020-05-10 DIAGNOSIS — I10 Essential (primary) hypertension: Secondary | ICD-10-CM | POA: Diagnosis not present

## 2020-05-10 DIAGNOSIS — I509 Heart failure, unspecified: Secondary | ICD-10-CM | POA: Diagnosis not present

## 2020-05-10 DIAGNOSIS — E1142 Type 2 diabetes mellitus with diabetic polyneuropathy: Secondary | ICD-10-CM | POA: Diagnosis not present

## 2020-05-11 DIAGNOSIS — E785 Hyperlipidemia, unspecified: Secondary | ICD-10-CM | POA: Diagnosis not present

## 2020-05-11 DIAGNOSIS — E1151 Type 2 diabetes mellitus with diabetic peripheral angiopathy without gangrene: Secondary | ICD-10-CM | POA: Diagnosis not present

## 2020-05-11 DIAGNOSIS — Z4781 Encounter for orthopedic aftercare following surgical amputation: Secondary | ICD-10-CM | POA: Diagnosis not present

## 2020-05-11 DIAGNOSIS — C679 Malignant neoplasm of bladder, unspecified: Secondary | ICD-10-CM | POA: Diagnosis not present

## 2020-05-11 DIAGNOSIS — K219 Gastro-esophageal reflux disease without esophagitis: Secondary | ICD-10-CM | POA: Diagnosis not present

## 2020-05-11 DIAGNOSIS — I10 Essential (primary) hypertension: Secondary | ICD-10-CM | POA: Diagnosis not present

## 2020-05-11 DIAGNOSIS — M1990 Unspecified osteoarthritis, unspecified site: Secondary | ICD-10-CM | POA: Diagnosis not present

## 2020-05-11 DIAGNOSIS — H9190 Unspecified hearing loss, unspecified ear: Secondary | ICD-10-CM | POA: Diagnosis not present

## 2020-05-11 DIAGNOSIS — I4891 Unspecified atrial fibrillation: Secondary | ICD-10-CM | POA: Diagnosis not present

## 2020-05-11 DIAGNOSIS — S78111A Complete traumatic amputation at level between right hip and knee, initial encounter: Secondary | ICD-10-CM | POA: Diagnosis not present

## 2020-05-11 DIAGNOSIS — R413 Other amnesia: Secondary | ICD-10-CM | POA: Diagnosis not present

## 2020-05-16 DIAGNOSIS — H3530 Unspecified macular degeneration: Secondary | ICD-10-CM | POA: Diagnosis not present

## 2020-05-17 DIAGNOSIS — I10 Essential (primary) hypertension: Secondary | ICD-10-CM | POA: Diagnosis not present

## 2020-05-17 DIAGNOSIS — K219 Gastro-esophageal reflux disease without esophagitis: Secondary | ICD-10-CM | POA: Diagnosis not present

## 2020-05-17 DIAGNOSIS — R413 Other amnesia: Secondary | ICD-10-CM | POA: Diagnosis not present

## 2020-05-17 DIAGNOSIS — M1990 Unspecified osteoarthritis, unspecified site: Secondary | ICD-10-CM | POA: Diagnosis not present

## 2020-05-17 DIAGNOSIS — Z4781 Encounter for orthopedic aftercare following surgical amputation: Secondary | ICD-10-CM | POA: Diagnosis not present

## 2020-05-17 DIAGNOSIS — H9190 Unspecified hearing loss, unspecified ear: Secondary | ICD-10-CM | POA: Diagnosis not present

## 2020-05-17 DIAGNOSIS — I4891 Unspecified atrial fibrillation: Secondary | ICD-10-CM | POA: Diagnosis not present

## 2020-05-17 DIAGNOSIS — E785 Hyperlipidemia, unspecified: Secondary | ICD-10-CM | POA: Diagnosis not present

## 2020-05-17 DIAGNOSIS — E1151 Type 2 diabetes mellitus with diabetic peripheral angiopathy without gangrene: Secondary | ICD-10-CM | POA: Diagnosis not present

## 2020-05-17 DIAGNOSIS — C679 Malignant neoplasm of bladder, unspecified: Secondary | ICD-10-CM | POA: Diagnosis not present

## 2020-05-25 ENCOUNTER — Other Ambulatory Visit: Payer: Self-pay | Admitting: *Deleted

## 2020-05-25 DIAGNOSIS — C679 Malignant neoplasm of bladder, unspecified: Secondary | ICD-10-CM | POA: Diagnosis not present

## 2020-05-25 DIAGNOSIS — E785 Hyperlipidemia, unspecified: Secondary | ICD-10-CM | POA: Diagnosis not present

## 2020-05-25 DIAGNOSIS — I4891 Unspecified atrial fibrillation: Secondary | ICD-10-CM | POA: Diagnosis not present

## 2020-05-25 DIAGNOSIS — E1151 Type 2 diabetes mellitus with diabetic peripheral angiopathy without gangrene: Secondary | ICD-10-CM | POA: Diagnosis not present

## 2020-05-25 DIAGNOSIS — Z4781 Encounter for orthopedic aftercare following surgical amputation: Secondary | ICD-10-CM | POA: Diagnosis not present

## 2020-05-25 DIAGNOSIS — K219 Gastro-esophageal reflux disease without esophagitis: Secondary | ICD-10-CM | POA: Diagnosis not present

## 2020-05-25 DIAGNOSIS — M1990 Unspecified osteoarthritis, unspecified site: Secondary | ICD-10-CM | POA: Diagnosis not present

## 2020-05-25 DIAGNOSIS — R413 Other amnesia: Secondary | ICD-10-CM | POA: Diagnosis not present

## 2020-05-25 DIAGNOSIS — I10 Essential (primary) hypertension: Secondary | ICD-10-CM | POA: Diagnosis not present

## 2020-05-25 DIAGNOSIS — H9190 Unspecified hearing loss, unspecified ear: Secondary | ICD-10-CM | POA: Diagnosis not present

## 2020-05-25 MED ORDER — FUROSEMIDE 40 MG PO TABS: 40.0000 mg | ORAL_TABLET | Freq: Every day | ORAL | 6 refills | Status: DC

## 2020-05-28 DIAGNOSIS — M1990 Unspecified osteoarthritis, unspecified site: Secondary | ICD-10-CM | POA: Diagnosis not present

## 2020-05-28 DIAGNOSIS — I4891 Unspecified atrial fibrillation: Secondary | ICD-10-CM | POA: Diagnosis not present

## 2020-05-28 DIAGNOSIS — K219 Gastro-esophageal reflux disease without esophagitis: Secondary | ICD-10-CM | POA: Diagnosis not present

## 2020-05-28 DIAGNOSIS — E1151 Type 2 diabetes mellitus with diabetic peripheral angiopathy without gangrene: Secondary | ICD-10-CM | POA: Diagnosis not present

## 2020-05-28 DIAGNOSIS — R413 Other amnesia: Secondary | ICD-10-CM | POA: Diagnosis not present

## 2020-05-28 DIAGNOSIS — I10 Essential (primary) hypertension: Secondary | ICD-10-CM | POA: Diagnosis not present

## 2020-05-28 DIAGNOSIS — C679 Malignant neoplasm of bladder, unspecified: Secondary | ICD-10-CM | POA: Diagnosis not present

## 2020-05-28 DIAGNOSIS — E785 Hyperlipidemia, unspecified: Secondary | ICD-10-CM | POA: Diagnosis not present

## 2020-05-28 DIAGNOSIS — Z4781 Encounter for orthopedic aftercare following surgical amputation: Secondary | ICD-10-CM | POA: Diagnosis not present

## 2020-05-28 DIAGNOSIS — H9190 Unspecified hearing loss, unspecified ear: Secondary | ICD-10-CM | POA: Diagnosis not present

## 2020-05-30 DIAGNOSIS — I10 Essential (primary) hypertension: Secondary | ICD-10-CM | POA: Diagnosis not present

## 2020-05-30 DIAGNOSIS — M1990 Unspecified osteoarthritis, unspecified site: Secondary | ICD-10-CM | POA: Diagnosis not present

## 2020-05-30 DIAGNOSIS — H9190 Unspecified hearing loss, unspecified ear: Secondary | ICD-10-CM | POA: Diagnosis not present

## 2020-05-30 DIAGNOSIS — E785 Hyperlipidemia, unspecified: Secondary | ICD-10-CM | POA: Diagnosis not present

## 2020-05-30 DIAGNOSIS — R413 Other amnesia: Secondary | ICD-10-CM | POA: Diagnosis not present

## 2020-05-30 DIAGNOSIS — E1151 Type 2 diabetes mellitus with diabetic peripheral angiopathy without gangrene: Secondary | ICD-10-CM | POA: Diagnosis not present

## 2020-05-30 DIAGNOSIS — C679 Malignant neoplasm of bladder, unspecified: Secondary | ICD-10-CM | POA: Diagnosis not present

## 2020-05-30 DIAGNOSIS — K219 Gastro-esophageal reflux disease without esophagitis: Secondary | ICD-10-CM | POA: Diagnosis not present

## 2020-05-30 DIAGNOSIS — Z4781 Encounter for orthopedic aftercare following surgical amputation: Secondary | ICD-10-CM | POA: Diagnosis not present

## 2020-05-30 DIAGNOSIS — I4891 Unspecified atrial fibrillation: Secondary | ICD-10-CM | POA: Diagnosis not present

## 2020-05-31 DIAGNOSIS — Z0100 Encounter for examination of eyes and vision without abnormal findings: Secondary | ICD-10-CM | POA: Diagnosis not present

## 2020-06-08 ENCOUNTER — Other Ambulatory Visit: Payer: Self-pay | Admitting: Cardiovascular Disease

## 2020-06-08 ENCOUNTER — Other Ambulatory Visit: Payer: Self-pay

## 2020-06-08 DIAGNOSIS — I739 Peripheral vascular disease, unspecified: Secondary | ICD-10-CM

## 2020-06-08 DIAGNOSIS — I4891 Unspecified atrial fibrillation: Secondary | ICD-10-CM | POA: Diagnosis not present

## 2020-06-08 DIAGNOSIS — Z4781 Encounter for orthopedic aftercare following surgical amputation: Secondary | ICD-10-CM | POA: Diagnosis not present

## 2020-06-08 DIAGNOSIS — E785 Hyperlipidemia, unspecified: Secondary | ICD-10-CM | POA: Diagnosis not present

## 2020-06-08 DIAGNOSIS — H9190 Unspecified hearing loss, unspecified ear: Secondary | ICD-10-CM | POA: Diagnosis not present

## 2020-06-08 DIAGNOSIS — I10 Essential (primary) hypertension: Secondary | ICD-10-CM | POA: Diagnosis not present

## 2020-06-08 DIAGNOSIS — M1990 Unspecified osteoarthritis, unspecified site: Secondary | ICD-10-CM | POA: Diagnosis not present

## 2020-06-08 DIAGNOSIS — K219 Gastro-esophageal reflux disease without esophagitis: Secondary | ICD-10-CM | POA: Diagnosis not present

## 2020-06-08 DIAGNOSIS — R413 Other amnesia: Secondary | ICD-10-CM | POA: Diagnosis not present

## 2020-06-08 DIAGNOSIS — E1151 Type 2 diabetes mellitus with diabetic peripheral angiopathy without gangrene: Secondary | ICD-10-CM | POA: Diagnosis not present

## 2020-06-08 DIAGNOSIS — C679 Malignant neoplasm of bladder, unspecified: Secondary | ICD-10-CM | POA: Diagnosis not present

## 2020-06-08 MED ORDER — CLOPIDOGREL BISULFATE 75 MG PO TABS: 75.0000 mg | ORAL_TABLET | Freq: Every day | ORAL | 3 refills | Status: AC

## 2020-06-08 MED ORDER — APIXABAN 2.5 MG PO TABS: 2.5000 mg | ORAL_TABLET | Freq: Two times a day (BID) | ORAL | 3 refills | Status: AC

## 2020-06-08 MED ORDER — FUROSEMIDE 40 MG PO TABS: 40.0000 mg | ORAL_TABLET | Freq: Every day | ORAL | 3 refills | Status: DC

## 2020-06-08 NOTE — Telephone Encounter (Signed)
Telephone call received from pts daughter [name] requesting a Rx for clopidogrel to be sent to [facility]. Rx sent and daughter notified.

## 2020-06-08 NOTE — Telephone Encounter (Signed)
Called and spoke with pt's daughter per DPR. States that in order for her father to get his prescriptions sent to their new pharmacy at [facility] she had to have paper scripts.   Called and spoke with Jesusita Oka tech at the [facility], she stated that they did not need a paper script and they had received the electronic refill I had just sent. She stated that they would be able to fill this as soon as he was ready for a refill. They also stated pts gabapentin and tradjenta was being refilled there as well  Called and spoke with pt's daughter notified that I had spoken with [facility]'s pharmacy and they were able to get the prescription and could refill it when needed. As well as other scripts that were being filled there. pts daughter thankful for the help and verbalized understanding with everything. No other questions at this time.

## 2020-06-08 NOTE — Telephone Encounter (Signed)
New Message:   Daughter called, she  needs new written prescriptions for Furosemide and Eliquis. Please call when it is ready, she wants to pick them up. Pt is [employer] administrator.

## 2020-06-11 DIAGNOSIS — K219 Gastro-esophageal reflux disease without esophagitis: Secondary | ICD-10-CM | POA: Diagnosis not present

## 2020-06-11 DIAGNOSIS — E1151 Type 2 diabetes mellitus with diabetic peripheral angiopathy without gangrene: Secondary | ICD-10-CM | POA: Diagnosis not present

## 2020-06-11 DIAGNOSIS — C679 Malignant neoplasm of bladder, unspecified: Secondary | ICD-10-CM | POA: Diagnosis not present

## 2020-06-11 DIAGNOSIS — R69 Illness, unspecified: Secondary | ICD-10-CM | POA: Diagnosis not present

## 2020-06-11 DIAGNOSIS — M1990 Unspecified osteoarthritis, unspecified site: Secondary | ICD-10-CM | POA: Diagnosis not present

## 2020-06-11 DIAGNOSIS — I4891 Unspecified atrial fibrillation: Secondary | ICD-10-CM | POA: Diagnosis not present

## 2020-06-11 DIAGNOSIS — S78111A Complete traumatic amputation at level between right hip and knee, initial encounter: Secondary | ICD-10-CM | POA: Diagnosis not present

## 2020-06-11 DIAGNOSIS — E785 Hyperlipidemia, unspecified: Secondary | ICD-10-CM | POA: Diagnosis not present

## 2020-06-11 DIAGNOSIS — I10 Essential (primary) hypertension: Secondary | ICD-10-CM | POA: Diagnosis not present

## 2020-06-11 DIAGNOSIS — R413 Other amnesia: Secondary | ICD-10-CM | POA: Diagnosis not present

## 2020-06-11 DIAGNOSIS — Z4781 Encounter for orthopedic aftercare following surgical amputation: Secondary | ICD-10-CM | POA: Diagnosis not present

## 2020-06-11 DIAGNOSIS — H9190 Unspecified hearing loss, unspecified ear: Secondary | ICD-10-CM | POA: Diagnosis not present

## 2020-06-18 DIAGNOSIS — E1151 Type 2 diabetes mellitus with diabetic peripheral angiopathy without gangrene: Secondary | ICD-10-CM | POA: Diagnosis not present

## 2020-06-18 DIAGNOSIS — K219 Gastro-esophageal reflux disease without esophagitis: Secondary | ICD-10-CM | POA: Diagnosis not present

## 2020-06-18 DIAGNOSIS — I4891 Unspecified atrial fibrillation: Secondary | ICD-10-CM | POA: Diagnosis not present

## 2020-06-18 DIAGNOSIS — I10 Essential (primary) hypertension: Secondary | ICD-10-CM | POA: Diagnosis not present

## 2020-06-18 DIAGNOSIS — C679 Malignant neoplasm of bladder, unspecified: Secondary | ICD-10-CM | POA: Diagnosis not present

## 2020-06-18 DIAGNOSIS — R413 Other amnesia: Secondary | ICD-10-CM | POA: Diagnosis not present

## 2020-06-18 DIAGNOSIS — E785 Hyperlipidemia, unspecified: Secondary | ICD-10-CM | POA: Diagnosis not present

## 2020-06-18 DIAGNOSIS — M1990 Unspecified osteoarthritis, unspecified site: Secondary | ICD-10-CM | POA: Diagnosis not present

## 2020-06-18 DIAGNOSIS — H9190 Unspecified hearing loss, unspecified ear: Secondary | ICD-10-CM | POA: Diagnosis not present

## 2020-06-18 DIAGNOSIS — Z4781 Encounter for orthopedic aftercare following surgical amputation: Secondary | ICD-10-CM | POA: Diagnosis not present

## 2020-06-19 DIAGNOSIS — N139 Obstructive and reflux uropathy, unspecified: Secondary | ICD-10-CM | POA: Diagnosis not present

## 2020-06-19 DIAGNOSIS — R338 Other retention of urine: Secondary | ICD-10-CM | POA: Diagnosis not present

## 2020-06-19 DIAGNOSIS — R3914 Feeling of incomplete bladder emptying: Secondary | ICD-10-CM | POA: Diagnosis not present

## 2020-06-26 ENCOUNTER — Telehealth: Payer: Self-pay | Admitting: Cardiovascular Disease

## 2020-06-26 NOTE — Progress Notes (Signed)
error 

## 2020-06-26 NOTE — Progress Notes (Signed)
Cardiology Clinic Note   Patient Name: Henry Wiggins Date of Encounter: 06/27/2020  Primary Care Provider:  Antony Contras, MD Primary Cardiologist:  Henry Klein, MD  Patient Profile    Henry Wiggins 86 year old male presents the clinic today for an evaluation of his shortness of breath and lower extremity edema.  Past Medical History    Past Medical History:  Diagnosis Date  . Arthritis   . Bladder cancer (HCC)    Dr. Janice Wiggins  . Depression   . Diabetes ([place])    type 2  . Family history of adverse reaction to anesthesia    Daughter stated that she is difficult to wake up.slow to wake up  . Full dentures   . GERD (gastroesophageal reflux disease)   . HOH (hard of hearing)   . Hyperlipidemia   . Hypertension   . Memory loss   . Stroke ([place])   . UTI (lower urinary tract infection)    Past Surgical History:  Procedure Laterality Date  . ABDOMINAL AORTOGRAM W/LOWER EXTREMITY Bilateral 12/19/2019   Procedure: ABDOMINAL AORTOGRAM W/LOWER EXTREMITY;  Surgeon: Henry Sandy, MD;  Location: [facility];  Service: Cardiovascular;  Laterality: Bilateral;  . ABDOMINAL AORTOGRAM W/LOWER EXTREMITY Left 03/05/2020   Procedure: ABDOMINAL AORTOGRAM W/LOWER EXTREMITY;  Surgeon: Henry Sandy, MD;  Location: [facility];  Service: Cardiovascular;  Laterality: Left;  . AMPUTATION Right 12/29/2019   Procedure: AMPUTATION ABOVE KNEE;  Surgeon: Henry Sandy, MD;  Location: [facility];  Service: Vascular;  Laterality: Right;  . APPENDECTOMY    . CARPAL TUNNEL RELEASE Right   . CATARACT EXTRACTION Bilateral   . CHOLECYSTECTOMY    . COLONOSCOPY    . ENDOVASCULAR REPAIR/STENT GRAFT Left 03/05/2020   Procedure: ENDOVASCULAR REPAIR/STENT GRAFT;  Surgeon: Henry Sandy, MD;  Location: [facility];  Service: Cardiovascular;  Laterality: Left;  PERONEAL  . EP IMPLANTABLE DEVICE N/A 05/28/2015   Procedure: Loop Recorder Insertion;   Surgeon: Henry Klein, MD;  Location: [facility];  Service: Cardiovascular;  Laterality: N/A;  . HERNIA REPAIR     x4  . MULTIPLE TOOTH EXTRACTIONS    . PERIPHERAL VASCULAR ATHERECTOMY Left 03/05/2020   Procedure: PERIPHERAL VASCULAR ATHERECTOMY;  Surgeon: Henry Sandy, MD;  Location: [facility];  Service: Cardiovascular;  Laterality: Left;  PERONEAL  . PERIPHERAL VASCULAR BALLOON ANGIOPLASTY Right 12/19/2019   Procedure: PERIPHERAL VASCULAR BALLOON ANGIOPLASTY;  Surgeon: Henry Sandy, MD;  Location: [facility];  Service: Cardiovascular;  Laterality: Right;  Anterior tibial artery  . TEE WITHOUT CARDIOVERSION N/A 05/28/2015   Procedure: TRANSESOPHAGEAL ECHOCARDIOGRAM (TEE);  Surgeon: Henry Klein, MD;  Location: [facility];  Service: Cardiovascular;  Laterality: N/A;  . TONSILLECTOMY      Allergies  No Known Allergies  History of Present Illness    Mr. Henry Wiggins has a PMH of essential hypertension, CVA, peripheral arterial disease, paroxysmal atrial fibrillation, chronic combined systolic and diastolic heart failure, acute renal failure, HLD, and right above-the-knee amputation 12/29/2019.  He converted to atrial fibrillation postoperatively and was symptomatic with shortness of breath.  Cardiology was consulted and IV amiodarone was started, he converted back to NSR.  A VQ scan was negative for pulmonary embolus.  Anticoagulation started for a CHA2DS2-VASc score of 8.  His chest x-ray pulmonary edema.  He was given gentle diuresis and had a creatinine of nearly 2.  At discharge he was euvolemic.  His echocardiogram showed  an LVEF of 30-35%, G2 DD, hypokinesis of the distal septum and apex.  He was noted not to be a good candidate for invasive procedures.  When seen in the office on 01/10/2020 he was bradycardic with mildly elevated BP.  His Coreg was stopped at that time and he was instructed to keep a blood pressure and heart rate log.  His  amiodarone was continued.  He was last seen by Henry Adas PA-C virtually on 02/02/2020.  He was accompanied by his daughter Henry Wiggins.  His blood pressure and heart rate log was reviewed.  His heart rate has been stable in the 70s with blood pressures in the 130-118/60-66 range.  He indicated he was feeling better on increased dose of gabapentin.  He did have some lightheadedness in the mornings.  He denied near syncope or syncope.  His atrial fibrillation was discussed and his amiodarone was discontinued.  He presents the clinic today for follow-up evaluation and states over the past several days his daughter has noticed that he has been increasingly short of breath with Occupational Therapy.  She states she has given him extra Lasix x2 days which is helped somewhat.  She states that he is not sleeping well since his wife died and he has a poor appetite as well.  He had a catheter placed due to urinary retention.  He is noted to have rhonchorous left lower lobe and expiratory wheezes.  I will order a chest x-ray, increase his Lasix, order potassium, order BMP, CBC, have him weigh daily, and have him follow-up in 1 week.  Patient contacted nurse triage line on 06/26/2020 with reports of increased swelling in his foot and ankle.  There is also reported that he had wheezing while working with occupational therapy.  He indicates that he is having trouble laying down due to trouble breathing.  He was instructed to take an extra 40 mg of Lasix.  Today he denies chest pain,  fatigue, palpitations, melena, hematuria, hemoptysis, diaphoresis, weakness, presyncope, syncope, orthopnea, and PND.   Home Medications    Prior to Admission medications   Medication Sig Start Date End Date Taking? Authorizing Provider  apixaban (ELIQUIS) 2.5 MG TABS tablet Take 1 tablet (2.5 mg total) by mouth 2 (two) times daily. 06/08/20   Croitoru, Dani Gobble, MD  blood glucose meter kit and supplies Dispense based on patient and insurance  preference. Use up to four times daily as directed. (FOR ICD-10 E10.9, E11.9). 01/04/20   Regalado, Belkys A, MD  Cholecalciferol (VITAMIN D) 50 MCG (2000 UT) tablet Take 2,000 Units by mouth daily.    [provider]  clopidogrel (PLAVIX) 75 MG tablet Take 1 tablet (75 mg total) by mouth daily. 06/08/20 06/08/21  Henry Sandy, MD  furosemide (LASIX) 40 MG tablet Take 1 tablet (40 mg total) by mouth daily. 06/08/20   Croitoru, Mihai, MD  gabapentin (NEURONTIN) 100 MG capsule Take 100 mg by mouth 2 (two) times daily.     [provider]  linagliptin (TRADJENTA) 5 MG TABS tablet Take 1 tablet (5 mg total) by mouth daily. 01/04/20   Regalado, Belkys A, MD  Multiple Vitamins-Minerals (PRESERVISION AREDS 2) CAPS Take 1 tablet by mouth daily.    [provider]  ONETOUCH VERIO test strip FOR USE WHEN CHECKING BLOOD GLUCOSE ONCE DAILY, ALTERNATING AM & PM, BEFORE MEALS E11.69 01/23/20   [provider]  Sulfamethoxazole-Trimethoprim (SULFAMETHOXAZOLE-TMP DS PO)     [provider]    Family History  Family History  Problem Relation Age of Onset  . Hypertension Mother   . Hypertension Father    He indicated that his mother is deceased. He indicated that his father is deceased. He indicated that his maternal grandmother is deceased. He indicated that his maternal grandfather is deceased. He indicated that his paternal grandmother is deceased. He indicated that his paternal grandfather is deceased.  Social History    Social History   Socioeconomic History  . Marital status: Married    Spouse name: Not on file  . Number of children: 5  . Years of education: 10  . Highest education level: Not on file  Occupational History  . Occupation: Retired  Tobacco Use  . Smoking status: Former Smoker    Packs/day: 0.50    Years: 5.00    Pack years: 2.50    Types: Cigarettes    Quit date: 12/22/1953    Years since quitting: 66.5  . Smokeless tobacco:  Never Used  Vaping Use  . Vaping Use: Never used  Substance and Sexual Activity  . Alcohol use: Not Currently    Alcohol/week: 0.0 standard drinks  . Drug use: Yes    Types: Oxycodone  . Sexual activity: Not on file  Other Topics Concern  . Not on file  Social History Narrative   Lives at home with his wife.   Right-handed.   2 cups caffeine per day.   Social Determinants of Health   Financial Resource Strain:   . Difficulty of Paying Living Expenses:   Food Insecurity:   . Worried About Charity fundraiser in the Last Year:   . Arboriculturist in the Last Year:   Transportation Needs:   . Film/video editor (Medical):   Marland Kitchen Lack of Transportation (Non-Medical):   Physical Activity:   . Days of Exercise per Week:   . Minutes of Exercise per Session:   Stress:   . Feeling of Stress :   Social Connections:   . Frequency of Communication with Friends and Family:   . Frequency of Social Gatherings with Friends and Family:   . Attends Religious Services:   . Active Member of Clubs or Organizations:   . Attends Archivist Meetings:   Marland Kitchen Marital Status:   Intimate Partner Violence:   . Fear of Current or Ex-Partner:   . Emotionally Abused:   Marland Kitchen Physically Abused:   . Sexually Abused:      Review of Systems    General:  No chills, fever, night sweats or weight changes.  Cardiovascular:  No chest pain, dyspnea on exertion, edema, orthopnea, palpitations, paroxysmal nocturnal dyspnea. Dermatological: No rash, lesions/masses Respiratory: No cough, dyspnea Urologic: No hematuria, dysuria Abdominal:   No nausea, vomiting, diarrhea, bright red blood per rectum, melena, or hematemesis Neurologic:  No visual changes, wkns, changes in mental status. All other systems reviewed and are otherwise negative except as noted above.  Physical Exam    VS:  BP 132/83   Pulse 78   Ht 5' 9"  (1.753 m)   SpO2 93%   BMI 29.53 kg/m  , BMI Body mass index is 29.53 kg/m. GEN:  Well nourished, well developed, in no acute distress. HEENT: normal. Neck: Supple, no JVD, carotid bruits, or masses. Cardiac: RRR, no murmurs, rubs, or gallops. No clubbing, cyanosis, edema.  Radials/DP/PT 2+ and equal bilaterally.  Respiratory:  Respirations regular and unlabored, rhonchi left lower lobe, expiratory wheeze left upper lobe  GI: Soft, nontender,  nondistended, BS + x 4. MS: no deformity or atrophy. Skin: warm and dry, no rash. Neuro:  Strength and sensation are intact. Psych: Normal affect.  Accessory Clinical Findings    ECG personally reviewed by me today-normal sinus rhythm right bundle branch block 78 bpm- No acute changes  EKG 12/31/2019 Atrial fibrillation with RVR 145 bpm RBBB  Echocardiogram 12/31/2019 1. Left ventricular ejection fraction, by visual estimation, is 30 to  35%. The left ventricle has moderate to severely decreased function. There  is mildly increased left ventricular hypertrophy.  2. Left ventricular diastolic parameters are consistent with Grade II  diastolic dysfunction (pseudonormalization).  3. The left ventricle demonstrates regional wall motion abnormalities.  4. Global right ventricle has normal systolic function.The right  ventricular size is normal.  5. Left atrial size was mildly dilated.  6. Right atrial size was normal.  7. The mitral valve is normal in structure. Mild mitral valve  regurgitation. No evidence of mitral stenosis.  8. The tricuspid valve is normal in structure.  9. The aortic valve is tricuspid. Aortic valve regurgitation is trivial.  Mild aortic valve sclerosis without stenosis.  10. The pulmonic valve was normal in structure. Pulmonic valve  regurgitation is not visualized.  11. The inferior vena cava is dilated in size with >50% respiratory  variability, suggesting right atrial pressure of 8 mmHg.  12. Severe hypokinesis of the distal septum and apex; overall moderate to  severe LV dysfunction; grade 2  diastolic dsyfunction; mild LVH; mild LAE;  mild MR.   Assessment & Plan   1.  Atrial fibrillation-EKG today shows sinus rhythm right bundle branch block.  Converted postoperatively following AKA (12/29/2019), was started on amiodarone and converted back to normal sinus rhythm.  He was placed on amiodarone 200 mg daily.  CHA2DS2-VASc score 8.  Bradycardic on carvedilol and amiodarone.  Had experienced some lightheadedness on amiodarone which was discontinued.  Plan to restart carvedilol as goal-directed therapy for heart failure. Continue Eliquis, Heart healthy low-sodium diet-salty 6 given Increase physical activity as tolerated    Acute on chronic systolic diastolic heart [id number]+ pitting left lower extremity edema.  Echocardiogram showed new reduced LVEF of 30-35%, G2 DD, hypokinesis of the distal septum and apex.  He became bradycardic on carvedilol which was stopped.  Deemed to be not a good candidate for invasive procedures. Increase furosemide to 80 mg x 3 days then return to normal 40 Start 40 mEq of potassium x3 days then stop Daily weights Heart healthy low-sodium diet Increase physical activity as tolerated Order BMP today and in 1 week  Shortness of breath/expiratory wheeze-short of breath with regular physical activity.  Rhonchorous left lower lobe expiratory wheeze. Order CXR Furosemide/potassium as above Order CBC  Peripheral arterial disease-status post right AKA 12/29/2019. Continue gabapentin Followed by vascular surgery/PCP   Disposition: Follow-up with APP in 1 week.  Jossie Ng. Sharunda Salmon NP-C    06/27/2020, 9:11 AM [place] [address] Office [phone] Fax [phone]

## 2020-06-26 NOTE — Telephone Encounter (Signed)
Spoke with pt daughter, she reports for over a week now he has had swelling in his feet and ankles. She reports he can not lay down because he reports he can not breathe. When he is up and active with OT they report hearing wheezing. He has an appointment tomorrow and okay given for the patient to take an extra 40 mg of furosemide today. Daughter agreed with that plan.

## 2020-06-26 NOTE — Telephone Encounter (Signed)
Pt c/o swelling: STAT is pt has developed SOB within 24 hours  1) How much weight have you gained and in what time span? Does not believe patient has gained weight  2) If swelling, where is the swelling located? Left leg, right leg has been amputated  3) Are you currently taking a fluid pill? no  4) Are you currently SOB? No sleeping now, SOB on exertion, for a week  5) Do you have a log of your daily weights (if so, list)? no  6) Have you gained 3 pounds in a day or 5 pounds in a week? no  7) Have you traveled recently? no   Patient's daughter states the patient has been having swelling in his leg for about 5 days. She states he gets SOB on exertion, but he is sleeping now. She states he has been kept hydrated and started anti depressants 2 weeks ago. She states he had a catheter put in, because his bladder was not draining. She states his wife died of CHF and she believes he may have it as well. She also states he has been having panic attacks. Patient has an appointment tomorrow 06/27/2020 with Coletta Memos.

## 2020-06-27 ENCOUNTER — Encounter (HOSPITAL_COMMUNITY): Payer: Self-pay | Admitting: *Deleted

## 2020-06-27 ENCOUNTER — Other Ambulatory Visit: Payer: Self-pay

## 2020-06-27 ENCOUNTER — Other Ambulatory Visit: Payer: Self-pay | Admitting: General Practice

## 2020-06-27 ENCOUNTER — Telehealth: Payer: Self-pay | Admitting: Nurse Practitioner

## 2020-06-27 ENCOUNTER — Ambulatory Visit
Admission: RE | Admit: 2020-06-27 | Discharge: 2020-06-27 | Disposition: A | Discharge status: Home / Self Care | Payer: Medicare HMO | Source: Ambulatory Visit | Attending: General Practice | Admitting: General Practice

## 2020-06-27 ENCOUNTER — Ambulatory Visit (INDEPENDENT_AMBULATORY_CARE_PROVIDER_SITE_OTHER): Payer: Medicare HMO | Admitting: General Practice

## 2020-06-27 ENCOUNTER — Encounter: Payer: Self-pay | Admitting: General Practice

## 2020-06-27 ENCOUNTER — Inpatient Hospital Stay (HOSPITAL_COMMUNITY)
Admission: EM | Admit: 2020-06-27 | Discharge: 2020-07-01 | DRG: 291 | Disposition: A | Discharge status: Home / Self Care | Payer: Medicare HMO | Attending: Family Medicine | Admitting: Family Medicine

## 2020-06-27 VITALS — BP 132/83 | HR 78 | Ht 69.0 in

## 2020-06-27 DIAGNOSIS — K219 Gastro-esophageal reflux disease without esophagitis: Secondary | ICD-10-CM | POA: Diagnosis present

## 2020-06-27 DIAGNOSIS — E785 Hyperlipidemia, unspecified: Secondary | ICD-10-CM | POA: Diagnosis present

## 2020-06-27 DIAGNOSIS — N320 Bladder-neck obstruction: Secondary | ICD-10-CM | POA: Diagnosis present

## 2020-06-27 DIAGNOSIS — R413 Other amnesia: Secondary | ICD-10-CM | POA: Diagnosis present

## 2020-06-27 DIAGNOSIS — R400 Somnolence: Secondary | ICD-10-CM | POA: Diagnosis present

## 2020-06-27 DIAGNOSIS — R0602 Shortness of breath: Secondary | ICD-10-CM

## 2020-06-27 DIAGNOSIS — M1990 Unspecified osteoarthritis, unspecified site: Secondary | ICD-10-CM | POA: Diagnosis present

## 2020-06-27 DIAGNOSIS — Z8551 Personal history of malignant neoplasm of bladder: Secondary | ICD-10-CM

## 2020-06-27 DIAGNOSIS — M7989 Other specified soft tissue disorders: Secondary | ICD-10-CM | POA: Diagnosis not present

## 2020-06-27 DIAGNOSIS — I700 Atherosclerosis of aorta: Secondary | ICD-10-CM | POA: Diagnosis not present

## 2020-06-27 DIAGNOSIS — I5042 Chronic combined systolic (congestive) and diastolic (congestive) heart failure: Secondary | ICD-10-CM | POA: Diagnosis not present

## 2020-06-27 DIAGNOSIS — Z66 Do not resuscitate: Secondary | ICD-10-CM | POA: Diagnosis present

## 2020-06-27 DIAGNOSIS — Z7902 Long term (current) use of antithrombotics/antiplatelets: Secondary | ICD-10-CM

## 2020-06-27 DIAGNOSIS — Z89619 Acquired absence of unspecified leg above knee: Secondary | ICD-10-CM

## 2020-06-27 DIAGNOSIS — E8779 Other fluid overload: Secondary | ICD-10-CM

## 2020-06-27 DIAGNOSIS — Z87891 Personal history of nicotine dependence: Secondary | ICD-10-CM

## 2020-06-27 DIAGNOSIS — E1152 Type 2 diabetes mellitus with diabetic peripheral angiopathy with gangrene: Secondary | ICD-10-CM | POA: Diagnosis present

## 2020-06-27 DIAGNOSIS — Z9049 Acquired absence of other specified parts of digestive tract: Secondary | ICD-10-CM

## 2020-06-27 DIAGNOSIS — F0390 Unspecified dementia without behavioral disturbance: Secondary | ICD-10-CM | POA: Diagnosis present

## 2020-06-27 DIAGNOSIS — I739 Peripheral vascular disease, unspecified: Secondary | ICD-10-CM | POA: Diagnosis present

## 2020-06-27 DIAGNOSIS — N179 Acute kidney failure, unspecified: Secondary | ICD-10-CM | POA: Diagnosis present

## 2020-06-27 DIAGNOSIS — I110 Hypertensive heart disease with heart failure: Secondary | ICD-10-CM | POA: Diagnosis not present

## 2020-06-27 DIAGNOSIS — R451 Restlessness and agitation: Secondary | ICD-10-CM | POA: Diagnosis not present

## 2020-06-27 DIAGNOSIS — I517 Cardiomegaly: Secondary | ICD-10-CM | POA: Diagnosis not present

## 2020-06-27 DIAGNOSIS — I480 Paroxysmal atrial fibrillation: Secondary | ICD-10-CM | POA: Diagnosis not present

## 2020-06-27 DIAGNOSIS — I130 Hypertensive heart and chronic kidney disease with heart failure and stage 1 through stage 4 chronic kidney disease, or unspecified chronic kidney disease: Principal | ICD-10-CM | POA: Diagnosis present

## 2020-06-27 DIAGNOSIS — N183 Chronic kidney disease, stage 3 unspecified: Secondary | ICD-10-CM | POA: Diagnosis present

## 2020-06-27 DIAGNOSIS — I5043 Acute on chronic combined systolic (congestive) and diastolic (congestive) heart failure: Secondary | ICD-10-CM | POA: Diagnosis present

## 2020-06-27 DIAGNOSIS — E871 Hypo-osmolality and hyponatremia: Secondary | ICD-10-CM | POA: Diagnosis present

## 2020-06-27 DIAGNOSIS — Z8249 Family history of ischemic heart disease and other diseases of the circulatory system: Secondary | ICD-10-CM

## 2020-06-27 DIAGNOSIS — Z8673 Personal history of transient ischemic attack (TIA), and cerebral infarction without residual deficits: Secondary | ICD-10-CM

## 2020-06-27 DIAGNOSIS — I5023 Acute on chronic systolic (congestive) heart failure: Secondary | ICD-10-CM | POA: Diagnosis not present

## 2020-06-27 DIAGNOSIS — Z89611 Acquired absence of right leg above knee: Secondary | ICD-10-CM

## 2020-06-27 DIAGNOSIS — J90 Pleural effusion, not elsewhere classified: Secondary | ICD-10-CM | POA: Diagnosis not present

## 2020-06-27 DIAGNOSIS — E876 Hypokalemia: Secondary | ICD-10-CM | POA: Diagnosis present

## 2020-06-27 DIAGNOSIS — Z79899 Other long term (current) drug therapy: Secondary | ICD-10-CM

## 2020-06-27 DIAGNOSIS — F419 Anxiety disorder, unspecified: Secondary | ICD-10-CM | POA: Diagnosis present

## 2020-06-27 DIAGNOSIS — Z7901 Long term (current) use of anticoagulants: Secondary | ICD-10-CM

## 2020-06-27 DIAGNOSIS — E1122 Type 2 diabetes mellitus with diabetic chronic kidney disease: Secondary | ICD-10-CM | POA: Diagnosis present

## 2020-06-27 DIAGNOSIS — N1831 Chronic kidney disease, stage 3a: Secondary | ICD-10-CM | POA: Diagnosis present

## 2020-06-27 DIAGNOSIS — E1165 Type 2 diabetes mellitus with hyperglycemia: Secondary | ICD-10-CM | POA: Diagnosis present

## 2020-06-27 DIAGNOSIS — R339 Retention of urine, unspecified: Secondary | ICD-10-CM | POA: Diagnosis present

## 2020-06-27 DIAGNOSIS — M47814 Spondylosis without myelopathy or radiculopathy, thoracic region: Secondary | ICD-10-CM | POA: Diagnosis not present

## 2020-06-27 DIAGNOSIS — E8770 Fluid overload, unspecified: Secondary | ICD-10-CM | POA: Diagnosis not present

## 2020-06-27 DIAGNOSIS — Z794 Long term (current) use of insulin: Secondary | ICD-10-CM

## 2020-06-27 DIAGNOSIS — I10 Essential (primary) hypertension: Secondary | ICD-10-CM | POA: Diagnosis present

## 2020-06-27 DIAGNOSIS — Z20822 Contact with and (suspected) exposure to covid-19: Secondary | ICD-10-CM | POA: Diagnosis present

## 2020-06-27 DIAGNOSIS — H9190 Unspecified hearing loss, unspecified ear: Secondary | ICD-10-CM | POA: Diagnosis present

## 2020-06-27 DIAGNOSIS — F329 Major depressive disorder, single episode, unspecified: Secondary | ICD-10-CM | POA: Diagnosis present

## 2020-06-27 HISTORY — DX: Peripheral vascular disease, unspecified: I73.9

## 2020-06-27 HISTORY — DX: Anxiety disorder, unspecified: F41.9

## 2020-06-27 HISTORY — DX: Heart failure, unspecified: I50.9

## 2020-06-27 HISTORY — DX: Chronic kidney disease, unspecified: N18.9

## 2020-06-27 LAB — BASIC METABOLIC PANEL
Anion gap: 11 (ref 5–15)
BUN/Creatinine Ratio: 17 (ref 10–24)
BUN: 24 mg/dL (ref 10–36)
BUN: 27 mg/dL — ABNORMAL HIGH (ref 8–23)
CO2: 21 mmol/L (ref 20–29)
CO2: 24 mmol/L (ref 22–32)
Calcium: 9 mg/dL (ref 8.6–10.2)
Calcium: 8.9 mg/dL (ref 8.9–10.3)
Chloride: 79 mmol/L — ABNORMAL LOW (ref 96–106)
Chloride: 82 mmol/L — ABNORMAL LOW (ref 98–111)
Creatinine, Ser: 1.43 mg/dL — ABNORMAL HIGH (ref 0.76–1.27)
Creatinine, Ser: 1.39 mg/dL — ABNORMAL HIGH (ref 0.61–1.24)
GFR calc Af Amer: 49 mL/min/{1.73_m2} — ABNORMAL LOW (ref >59)
GFR calc Af Amer: 51 mL/min — ABNORMAL LOW (ref >60)
GFR calc non Af Amer: 43 mL/min/{1.73_m2} — ABNORMAL LOW (ref >59)
GFR calc non Af Amer: 44 mL/min — ABNORMAL LOW (ref >60)
Glucose, Bld: 215 mg/dL — ABNORMAL HIGH (ref 70–99)
Glucose: 204 mg/dL — ABNORMAL HIGH (ref 65–99)
Potassium: 5.1 mmol/L (ref 3.5–5.2)
Potassium: 4.5 mmol/L (ref 3.5–5.1)
Sodium: 118 mmol/L — CL (ref 134–144)
Sodium: 117 mmol/L — CL (ref 135–145)

## 2020-06-27 LAB — CBC
HCT: 33.8 % — ABNORMAL LOW (ref 39.0–52.0)
Hematocrit: 33.5 % — ABNORMAL LOW (ref 37.5–51.0)
Hemoglobin: 11.6 g/dL — ABNORMAL LOW (ref 13.0–17.7)
Hemoglobin: 11.3 g/dL — ABNORMAL LOW (ref 13.0–17.0)
MCH: 29.3 pg (ref 26.6–33.0)
MCH: 28.5 pg (ref 26.0–34.0)
MCHC: 34.6 g/dL (ref 31.5–35.7)
MCHC: 33.4 g/dL (ref 30.0–36.0)
MCV: 85 fL (ref 79–97)
MCV: 85.4 fL (ref 80.0–100.0)
Platelets: 234 10*3/uL (ref 150–450)
Platelets: 254 10*3/uL (ref 150–400)
RBC: 3.96 x10E6/uL — ABNORMAL LOW (ref 4.14–5.80)
RBC: 3.96 MIL/uL — ABNORMAL LOW (ref 4.22–5.81)
RDW: 14 % (ref 11.6–15.4)
RDW: 14.4 % (ref 11.5–15.5)
WBC: 10 10*3/uL (ref 3.4–10.8)
WBC: 11 10*3/uL — ABNORMAL HIGH (ref 4.0–10.5)
nRBC: 0 % (ref 0.0–0.2)

## 2020-06-27 LAB — TROPONIN I (HIGH SENSITIVITY)
Troponin I (High Sensitivity): 38 ng/L — ABNORMAL HIGH (ref <18)
Troponin I (High Sensitivity): 37 ng/L — ABNORMAL HIGH (ref <18)

## 2020-06-27 MED ORDER — SODIUM CHLORIDE 0.9% FLUSH: 3.0000 mL | Freq: Once | INTRAVENOUS | Status: DC

## 2020-06-27 MED ORDER — FUROSEMIDE 40 MG PO TABS: 40.0000 mg | ORAL_TABLET | Freq: Every day | ORAL | 3 refills | Status: DC

## 2020-06-27 MED ORDER — POTASSIUM CHLORIDE CRYS ER 20 MEQ PO TBCR: 20.0000 meq | EXTENDED_RELEASE_TABLET | Freq: Every day | ORAL | 0 refills | Status: AC

## 2020-06-27 MED ORDER — POTASSIUM CHLORIDE CRYS ER 20 MEQ PO TBCR: 20.0000 meq | EXTENDED_RELEASE_TABLET | Freq: Every day | ORAL | 0 refills | Status: DC

## 2020-06-27 NOTE — ED Triage Notes (Signed)
Pt brought in by daughter for c/o shortness of breath when lying flat, swelling in the LLE, fatigue, and had wheezing this morning.

## 2020-06-27 NOTE — Patient Instructions (Addendum)
    Medication Instructions:  TAKE LASIX 80MG  x3 DAYS THEN BACK TO 40MG  DAILY  POTASSIUM 40MEQ x3 DAYS THEN STOP  *If you need a refill on your cardiac medications before your next appointment, please call your pharmacy*  Lab Work: BMET AND CBC TODAY AND BMET IN 1 WEEK-HERE IN OUR OFFICE  If you have labs (blood work) drawn today and your tests are completely normal, you will receive your results only by:  [facility] (if you have MyChart) OR A paper copy in the mail.  If you have any lab test that is abnormal or we need to change your treatment, we will call you to review the results. You may go to any [facility] that is convenient for you however, we do have a lab in our office that is able to assist you. You DO NOT need an appointment for our lab. The lab is open 8:00am and closes at 4:00pm. Lunch 12:45 - 1:45pm.  Testing/Procedures: CHEST XRAY AT [facility] [place], [place]  Special Instructions  PLEASE READ AND FOLLOW SALTY 6-ATTACHED  Follow-Up: Your next appointment:  1 week(s) OR 1st AVAILABLE APPOINTMENT In Person with Coletta Memos, FNP OR ANY APP  At [facility], you and your health needs are our priority.  As part of our continuing mission to provide you with exceptional heart care, we have created designated Provider Care Teams.  These Care Teams include your primary Cardiologist (physician) and Advanced Practice Providers (APPs -  Physician Assistants and Nurse Practitioners) who all work together to provide you with the care you need,

## 2020-06-27 NOTE — Telephone Encounter (Signed)
   Pt seen in clinic today and had labs drawn in the setting of volume overload.  Na returned @ 118.  I called pt and spoke to his dtr.  I have recommended that he present to the ED tonight for repeat labs and treatment of hyponatremia + volume overload.  Pts dtr verbalized understanding and will bring him to the ED tonight.   Lab Results  Component Value Date   CREATININE 1.43 (H) 06/27/2020   BUN 24 06/27/2020   NA 118 (LL) 06/27/2020   K 5.1 06/27/2020   CL 79 (L) 06/27/2020   CO2 21 06/27/2020     Murray Hodgkins, NP 06/27/2020, 6:15 PM

## 2020-06-27 NOTE — Addendum Note (Signed)
Addended by: Waylan Rocher on: 06/27/2020 10:48 AM   Modules accepted: Orders

## 2020-06-27 NOTE — ED Provider Notes (Signed)
[facility] EMERGENCY DEPARTMENT Provider Note   CSN: [id number] Arrival date & time: 06/27/20  1904     History Chief Complaint  Patient presents with  . Leg Swelling    Ana Liaw Sandra is a 86 y.o. male.  HPI     This is a 86 year old male with a history of diabetes, hypertension, hyperlipidemia, systolic heart failure, paroxysmal atrial fibrillation who presents with abnormal labs.  Patient was seen and evaluated at cardiology office earlier today for concerns of shortness of breath and lower extremity swelling.  He had lab work obtained.  Daughter notes that they were called this evening and told to come to the ER for a low sodium.  She reports that over the last several days he has had increasing swelling of the left lower extremity.  He has an AKA on the right.  She has increased his Lasix from 40 mg daily to 80 mg daily for the last 3 days.  However, she does not feel he has had very good urine output.  Of note he also had a Foley catheter placed 1 week ago for urinary retention.  She has noted increased dyspnea with minimal exertion and orthopnea.  They also have noted some wheezing with occupational therapy.  He denies any chest pain.  He has had a cough but no fevers.  He is fully vaccinated against COVID-19.    Chart reviewed.  Patient with an EF of 30 to 35%.  Not a candidate for invasive procedures.  Had a right above-the-knee amputation in January of this year by Dr. Donzetta Matters.  X-ray from cardiology office showed bibasilar atelectasis versus infiltrate and small bilateral pleural effusions.  Patient not on oxygen at home.   Past Medical History:  Diagnosis Date  . Arthritis   . Bladder cancer (HCC)    Dr. Janice Norrie  . Depression   . Diabetes ([place])    type 2  . Family history of adverse reaction to anesthesia    Daughter stated that she is difficult to wake up.slow to wake up  . Full dentures   . GERD (gastroesophageal reflux disease)   . HOH (hard of  hearing)   . Hyperlipidemia   . Hypertension   . Memory loss   . Stroke ([place])   . UTI (lower urinary tract infection)     Patient Active Problem List   Diagnosis Date Noted  . Chronic combined systolic and diastolic heart failure ([place]) 01/12/2020  . Paroxysmal atrial fibrillation (HCC)   . PAD (peripheral artery disease) ([place]) 12/29/2019  . S/P AKA (above knee amputation) ([place]) 12/29/2019  . Encounter for loop recorder check 09/05/2015  . Stroke ([place]) 05/27/2015  . CVA (cerebral infarction) 05/26/2015  . Diabetes ([place]) 05/26/2015  . Hypertension 05/26/2015  . Hyperlipidemia 05/26/2015  . Acute renal failure ([place]) 05/26/2015    Past Surgical History:  Procedure Laterality Date  . ABDOMINAL AORTOGRAM W/LOWER EXTREMITY Bilateral 12/19/2019   Procedure: ABDOMINAL AORTOGRAM W/LOWER EXTREMITY;  Surgeon: Waynetta Sandy, MD;  Location: [facility];  Service: Cardiovascular;  Laterality: Bilateral;  . ABDOMINAL AORTOGRAM W/LOWER EXTREMITY Left 03/05/2020   Procedure: ABDOMINAL AORTOGRAM W/LOWER EXTREMITY;  Surgeon: Waynetta Sandy, MD;  Location: [facility];  Service: Cardiovascular;  Laterality: Left;  . AMPUTATION Right 12/29/2019   Procedure: AMPUTATION ABOVE KNEE;  Surgeon: Waynetta Sandy, MD;  Location: [facility];  Service: Vascular;  Laterality: Right;  . APPENDECTOMY    . CARPAL TUNNEL RELEASE  Right   . CATARACT EXTRACTION Bilateral   . CHOLECYSTECTOMY    . COLONOSCOPY    . ENDOVASCULAR REPAIR/STENT GRAFT Left 03/05/2020   Procedure: ENDOVASCULAR REPAIR/STENT GRAFT;  Surgeon: Waynetta Sandy, MD;  Location: [facility];  Service: Cardiovascular;  Laterality: Left;  PERONEAL  . EP IMPLANTABLE DEVICE N/A 05/28/2015   Procedure: Loop Recorder Insertion;  Surgeon: Sanda Klein, MD;  Location: [facility];  Service: Cardiovascular;  Laterality: N/A;  . HERNIA REPAIR     x4  . MULTIPLE TOOTH EXTRACTIONS    . PERIPHERAL  VASCULAR ATHERECTOMY Left 03/05/2020   Procedure: PERIPHERAL VASCULAR ATHERECTOMY;  Surgeon: Waynetta Sandy, MD;  Location: [facility];  Service: Cardiovascular;  Laterality: Left;  PERONEAL  . PERIPHERAL VASCULAR BALLOON ANGIOPLASTY Right 12/19/2019   Procedure: PERIPHERAL VASCULAR BALLOON ANGIOPLASTY;  Surgeon: Waynetta Sandy, MD;  Location: [facility];  Service: Cardiovascular;  Laterality: Right;  Anterior tibial artery  . TEE WITHOUT CARDIOVERSION N/A 05/28/2015   Procedure: TRANSESOPHAGEAL ECHOCARDIOGRAM (TEE);  Surgeon: Sanda Klein, MD;  Location: [facility] END[facility];  Service: Cardiovascular;  Laterality: N/A;  . TONSILLECTOMY         Family History  Problem Relation Age of Onset  . Hypertension Mother   . Hypertension Father     Social History   Tobacco Use  . Smoking status: Former Smoker    Packs/day: 0.50    Years: 5.00    Pack years: 2.50    Types: Cigarettes    Quit date: 12/22/1953    Years since quitting: 66.5  . Smokeless tobacco: Never Used  Vaping Use  . Vaping Use: Never used  Substance Use Topics  . Alcohol use: Not Currently    Alcohol/week: 0.0 standard drinks  . Drug use: Yes    Types: Oxycodone    Home Medications Prior to Admission medications   Medication Sig Start Date End Date Taking? Authorizing Provider  apixaban (ELIQUIS) 2.5 MG TABS tablet Take 1 tablet (2.5 mg total) by mouth 2 (two) times daily. 06/08/20   Croitoru, Dani Gobble, MD  blood glucose meter kit and supplies Dispense based on patient and insurance preference. Use up to four times daily as directed. (FOR ICD-10 E10.9, E11.9). 01/04/20   Regalado, Belkys A, MD  Cholecalciferol (VITAMIN D) 50 MCG (2000 UT) tablet Take 2,000 Units by mouth daily.    [provider]  clopidogrel (PLAVIX) 75 MG tablet Take 1 tablet (75 mg total) by mouth daily. 06/08/20 06/08/21  Waynetta Sandy, MD  escitalopram (LEXAPRO) 5 MG tablet Take 5 mg by mouth daily.  06/11/20   [provider]  furosemide (LASIX) 40 MG tablet Take 1 tablet (40 mg total) by mouth daily. 80MG x3 DAYS THEN BACK TO 40MG 06/27/20   Deberah Pelton, NP  gabapentin (NEURONTIN) 100 MG capsule Take 100 mg by mouth 2 (two) times daily.     [provider]  linagliptin (TRADJENTA) 5 MG TABS tablet Take 1 tablet (5 mg total) by mouth daily. 01/04/20   Regalado, Belkys A, MD  Multiple Vitamins-Minerals (PRESERVISION AREDS 2) CAPS Take 1 tablet by mouth daily.    [provider]  ONETOUCH VERIO test strip FOR USE WHEN CHECKING BLOOD GLUCOSE ONCE DAILY, ALTERNATING AM & PM, BEFORE MEALS E11.69 01/23/20   [provider]  potassium chloride SA (KLOR-CON M20) 20 MEQ tablet Take 1 tablet (20 mEq total) by mouth daily. 40MEQ x3 DAYS THEN STOP 06/27/20 09/25/20  Deberah Pelton, NP  silodosin (RAPAFLO) 4 MG CAPS capsule Take 4 mg by mouth daily. 06/11/20   [provider]  Sulfamethoxazole-Trimethoprim (SULFAMETHOXAZOLE-TMP DS PO)     [provider]    Allergies    Patient has no known allergies.  Review of Systems   Review of Systems  Constitutional: Negative for fever.  Respiratory: Positive for cough, shortness of breath and wheezing.   Cardiovascular: Positive for leg swelling. Negative for chest pain.  Gastrointestinal: Negative for abdominal pain, nausea and vomiting.  Genitourinary: Positive for difficulty urinating. Negative for dysuria.  Psychiatric/Behavioral: Positive for confusion.  All other systems reviewed and are negative.   Physical Exam Updated Vital Signs BP (!) 161/91 (BP Location: Left Arm)   Pulse 93   Temp 98.3 F (36.8 C) (Oral)   Resp 20   SpO2 95%   Physical Exam Vitals and nursing note reviewed.  Constitutional:      Appearance: He is well-developed.     Comments: Elderly, chronically ill-appearing but nontoxic  HENT:     Head: Normocephalic and atraumatic.     Mouth/Throat:     Mouth: Mucous  membranes are moist.  Eyes:     Pupils: Pupils are equal, round, and reactive to light.  Cardiovascular:     Rate and Rhythm: Normal rate and regular rhythm.     Heart sounds: Normal heart sounds. No murmur heard.   Pulmonary:     Effort: Pulmonary effort is normal. No respiratory distress.     Breath sounds: Wheezing and rales present.     Comments: Occasional wheeze, rales bilateral lower lobes Abdominal:     General: Bowel sounds are normal.     Palpations: Abdomen is soft.     Tenderness: There is no abdominal tenderness. There is no rebound.  Genitourinary:    Comments: Foley catheter in place Musculoskeletal:     Cervical back: Neck supple.     Comments: Right AKA with bruising noted adjacent to the stump, no erythema, left lower extremity swelling noted 1-2+, no calf pain or tenderness  Lymphadenopathy:     Cervical: No cervical adenopathy.  Skin:    General: Skin is warm and dry.  Neurological:     Mental Status: He is alert and oriented to person, place, and time.  Psychiatric:        Mood and Affect: Mood normal.     ED Results / Procedures / Treatments   Labs (all labs ordered are listed, but only abnormal results are displayed) Labs Reviewed  BASIC METABOLIC PANEL - Abnormal; Notable for the following components:      Result Value   Sodium 117 (*)    Chloride 82 (*)    Glucose, Bld 215 (*)    BUN 27 (*)    Creatinine, Ser 1.39 (*)    GFR calc non Af Amer 44 (*)    GFR calc Af Amer 51 (*)    All other components within normal limits  CBC - Abnormal; Notable for the following components:   WBC 11.0 (*)    RBC 3.96 (*)    Hemoglobin 11.3 (*)    HCT 33.8 (*)    All other components within normal limits  TROPONIN I (HIGH SENSITIVITY) - Abnormal; Notable for the following components:   Troponin I (High Sensitivity) 38 (*)    All other components within normal limits  TROPONIN I (HIGH SENSITIVITY) - Abnormal; Notable for the following components:   Troponin  I (  High Sensitivity) 37 (*)    All other components within normal limits  SARS CORONAVIRUS 2 BY RT PCR [facility] ORDER, [place] LAB)  BRAIN NATRIURETIC PEPTIDE    EKG EKG Interpretation  Date/Time:  Wednesday June 27 2020 19:29:29 EDT Ventricular Rate:  79 PR Interval:    QRS Duration: 124 QT Interval:  410 QTC Calculation: 470 R Axis:   -16 Text Interpretation: Normal sinus rhythm Non-specific intra-ventricular conduction delay Abnormal QRS-T angle, consider primary T wave abnormality Abnormal ECG Confirmed by Thayer Jew [phone]) on 06/27/2020 11:22:54 PM   Radiology DG Chest 1 View  Result Date: 06/27/2020 CLINICAL DATA:  Shortness of breath with left lower leg edema x2 weeks. EXAM: CHEST  1 VIEW COMPARISON:  December 31, 2019 FINDINGS: Moderate severity areas of atelectasis and/or infiltrate are seen within the bilateral lung bases, right greater than left. Small bilateral pleural effusions are also seen, right greater than left. No pneumothorax is identified. The cardiac silhouette is markedly enlarged. There is mild calcification of the aortic arch. Multilevel degenerative changes seen throughout the thoracic spine. IMPRESSION: 1. Moderate severity bibasilar atelectasis and/or infiltrate, right greater than left. 2. Small bilateral pleural effusions, right greater than left. 3. Cardiomegaly. Electronically Signed   By: Virgina Norfolk M.D.   On: 06/27/2020 20:34    Procedures Procedures (including critical care time)  Medications Ordered in ED Medications  sodium chloride flush (NS) 0.9 % injection 3 mL (has no administration in time range)    ED Course  I have reviewed the triage vital signs and the nursing notes.  Pertinent labs & imaging results that were available during my care of the patient were reviewed by me and considered in my medical decision making (see chart for details).    MDM Rules/Calculators/A&P                            Patient presents with concerns for hyponatremia from his cardiology office.  Also evidence of volume overload likely related to ongoing heart failure.  He is not toxic appearing but chronically ill-appearing.  Vital signs are largely reassuring.  He is mildly hypertensive with a blood pressure of 161/91.  X-ray reviewed from earlier today showing some volume overload.  He clinically appears volume overloaded.  Unfortunately, he has increased his Lasix over the last 2 to 3 days and now is significantly hyponatremic to 117.  Suspect he will need significant fluid restriction for correction given his volume status.  Discussed CODE STATUS with patient and his daughter.  He is DNR.  Will discuss with admitting hospitalist.  Final Clinical Impression(s) / ED Diagnoses Final diagnoses:  Hyponatremia  Other hypervolemia  Acute on chronic systolic heart failure [facility])    Rx / DC Orders ED Discharge Orders    None       Merryl Hacker, MD 06/28/20 [phone]

## 2020-06-28 ENCOUNTER — Encounter (HOSPITAL_COMMUNITY): Payer: Self-pay | Admitting: Family Medicine

## 2020-06-28 DIAGNOSIS — Z515 Encounter for palliative care: Secondary | ICD-10-CM | POA: Diagnosis not present

## 2020-06-28 DIAGNOSIS — R0602 Shortness of breath: Secondary | ICD-10-CM | POA: Diagnosis not present

## 2020-06-28 DIAGNOSIS — M1990 Unspecified osteoarthritis, unspecified site: Secondary | ICD-10-CM | POA: Diagnosis present

## 2020-06-28 DIAGNOSIS — E785 Hyperlipidemia, unspecified: Secondary | ICD-10-CM | POA: Diagnosis present

## 2020-06-28 DIAGNOSIS — E876 Hypokalemia: Secondary | ICD-10-CM | POA: Diagnosis not present

## 2020-06-28 DIAGNOSIS — E1122 Type 2 diabetes mellitus with diabetic chronic kidney disease: Secondary | ICD-10-CM | POA: Diagnosis present

## 2020-06-28 DIAGNOSIS — I5023 Acute on chronic systolic (congestive) heart failure: Secondary | ICD-10-CM | POA: Diagnosis not present

## 2020-06-28 DIAGNOSIS — N183 Chronic kidney disease, stage 3 unspecified: Secondary | ICD-10-CM | POA: Diagnosis not present

## 2020-06-28 DIAGNOSIS — I5043 Acute on chronic combined systolic (congestive) and diastolic (congestive) heart failure: Secondary | ICD-10-CM | POA: Diagnosis not present

## 2020-06-28 DIAGNOSIS — F329 Major depressive disorder, single episode, unspecified: Secondary | ICD-10-CM | POA: Diagnosis present

## 2020-06-28 DIAGNOSIS — E1152 Type 2 diabetes mellitus with diabetic peripheral angiopathy with gangrene: Secondary | ICD-10-CM | POA: Diagnosis not present

## 2020-06-28 DIAGNOSIS — Z7189 Other specified counseling: Secondary | ICD-10-CM | POA: Diagnosis not present

## 2020-06-28 DIAGNOSIS — N179 Acute kidney failure, unspecified: Secondary | ICD-10-CM | POA: Diagnosis not present

## 2020-06-28 DIAGNOSIS — M7989 Other specified soft tissue disorders: Secondary | ICD-10-CM | POA: Diagnosis present

## 2020-06-28 DIAGNOSIS — N1831 Chronic kidney disease, stage 3a: Secondary | ICD-10-CM | POA: Diagnosis not present

## 2020-06-28 DIAGNOSIS — I480 Paroxysmal atrial fibrillation: Secondary | ICD-10-CM | POA: Diagnosis not present

## 2020-06-28 DIAGNOSIS — Z8551 Personal history of malignant neoplasm of bladder: Secondary | ICD-10-CM | POA: Diagnosis not present

## 2020-06-28 DIAGNOSIS — K219 Gastro-esophageal reflux disease without esophagitis: Secondary | ICD-10-CM | POA: Diagnosis present

## 2020-06-28 DIAGNOSIS — E1129 Type 2 diabetes mellitus with other diabetic kidney complication: Secondary | ICD-10-CM | POA: Diagnosis not present

## 2020-06-28 DIAGNOSIS — Z89611 Acquired absence of right leg above knee: Secondary | ICD-10-CM | POA: Diagnosis not present

## 2020-06-28 DIAGNOSIS — F0390 Unspecified dementia without behavioral disturbance: Secondary | ICD-10-CM | POA: Diagnosis present

## 2020-06-28 DIAGNOSIS — E1165 Type 2 diabetes mellitus with hyperglycemia: Secondary | ICD-10-CM | POA: Diagnosis present

## 2020-06-28 DIAGNOSIS — I5042 Chronic combined systolic (congestive) and diastolic (congestive) heart failure: Secondary | ICD-10-CM | POA: Diagnosis not present

## 2020-06-28 DIAGNOSIS — E871 Hypo-osmolality and hyponatremia: Secondary | ICD-10-CM | POA: Diagnosis not present

## 2020-06-28 DIAGNOSIS — Z8673 Personal history of transient ischemic attack (TIA), and cerebral infarction without residual deficits: Secondary | ICD-10-CM | POA: Diagnosis not present

## 2020-06-28 DIAGNOSIS — F419 Anxiety disorder, unspecified: Secondary | ICD-10-CM | POA: Diagnosis present

## 2020-06-28 DIAGNOSIS — H9190 Unspecified hearing loss, unspecified ear: Secondary | ICD-10-CM | POA: Diagnosis present

## 2020-06-28 DIAGNOSIS — R413 Other amnesia: Secondary | ICD-10-CM | POA: Diagnosis present

## 2020-06-28 DIAGNOSIS — Z66 Do not resuscitate: Secondary | ICD-10-CM | POA: Diagnosis not present

## 2020-06-28 DIAGNOSIS — I10 Essential (primary) hypertension: Secondary | ICD-10-CM | POA: Diagnosis not present

## 2020-06-28 DIAGNOSIS — R69 Illness, unspecified: Secondary | ICD-10-CM | POA: Diagnosis not present

## 2020-06-28 DIAGNOSIS — I739 Peripheral vascular disease, unspecified: Secondary | ICD-10-CM | POA: Diagnosis not present

## 2020-06-28 DIAGNOSIS — I130 Hypertensive heart and chronic kidney disease with heart failure and stage 1 through stage 4 chronic kidney disease, or unspecified chronic kidney disease: Secondary | ICD-10-CM | POA: Diagnosis not present

## 2020-06-28 DIAGNOSIS — R339 Retention of urine, unspecified: Secondary | ICD-10-CM | POA: Diagnosis present

## 2020-06-28 DIAGNOSIS — Z20822 Contact with and (suspected) exposure to covid-19: Secondary | ICD-10-CM | POA: Diagnosis not present

## 2020-06-28 LAB — GLUCOSE, CAPILLARY
Glucose-Capillary: 129 mg/dL — ABNORMAL HIGH (ref 70–99)
Glucose-Capillary: 152 mg/dL — ABNORMAL HIGH (ref 70–99)
Glucose-Capillary: 167 mg/dL — ABNORMAL HIGH (ref 70–99)

## 2020-06-28 LAB — OSMOLALITY, URINE: Osmolality, Ur: 494 mOsm/kg (ref 300–900)

## 2020-06-28 LAB — CBC
HCT: 32.8 % — ABNORMAL LOW (ref 39.0–52.0)
Hemoglobin: 10.9 g/dL — ABNORMAL LOW (ref 13.0–17.0)
MCH: 28.8 pg (ref 26.0–34.0)
MCHC: 33.2 g/dL (ref 30.0–36.0)
MCV: 86.5 fL (ref 80.0–100.0)
Platelets: 260 10*3/uL (ref 150–400)
RBC: 3.79 MIL/uL — ABNORMAL LOW (ref 4.22–5.81)
RDW: 14.5 % (ref 11.5–15.5)
WBC: 12 10*3/uL — ABNORMAL HIGH (ref 4.0–10.5)
nRBC: 0 % (ref 0.0–0.2)

## 2020-06-28 LAB — BASIC METABOLIC PANEL
Anion gap: 12 (ref 5–15)
BUN: 26 mg/dL — ABNORMAL HIGH (ref 8–23)
CO2: 22 mmol/L (ref 22–32)
Calcium: 8.6 mg/dL — ABNORMAL LOW (ref 8.9–10.3)
Chloride: 84 mmol/L — ABNORMAL LOW (ref 98–111)
Creatinine, Ser: 1.46 mg/dL — ABNORMAL HIGH (ref 0.61–1.24)
GFR calc Af Amer: 48 mL/min — ABNORMAL LOW (ref >60)
GFR calc non Af Amer: 42 mL/min — ABNORMAL LOW (ref >60)
Glucose, Bld: 199 mg/dL — ABNORMAL HIGH (ref 70–99)
Potassium: 4.7 mmol/L (ref 3.5–5.1)
Sodium: 118 mmol/L — CL (ref 135–145)

## 2020-06-28 LAB — HEMOGLOBIN A1C
Hgb A1c MFr Bld: 7.6 % — ABNORMAL HIGH (ref 4.8–5.6)
Mean Plasma Glucose: 171.42 mg/dL

## 2020-06-28 LAB — URIC ACID: Uric Acid, Serum: 8 mg/dL (ref 3.7–8.6)

## 2020-06-28 LAB — SODIUM, URINE, RANDOM: Sodium, Ur: <10 mmol/L

## 2020-06-28 LAB — TSH: TSH: 4.641 u[IU]/mL — ABNORMAL HIGH (ref 0.350–4.500)

## 2020-06-28 LAB — SODIUM
Sodium: 121 mmol/L — ABNORMAL LOW (ref 135–145)
Sodium: 119 mmol/L — CL (ref 135–145)

## 2020-06-28 LAB — OSMOLALITY: Osmolality: 261 mOsm/kg — ABNORMAL LOW (ref 275–295)

## 2020-06-28 LAB — CBG MONITORING, ED
Glucose-Capillary: 206 mg/dL — ABNORMAL HIGH (ref 70–99)
Glucose-Capillary: 174 mg/dL — ABNORMAL HIGH (ref 70–99)
Glucose-Capillary: 166 mg/dL — ABNORMAL HIGH (ref 70–99)

## 2020-06-28 LAB — BRAIN NATRIURETIC PEPTIDE: B Natriuretic Peptide: 2540.5 pg/mL — ABNORMAL HIGH (ref 0.0–100.0)

## 2020-06-28 LAB — SARS CORONAVIRUS 2 BY RT PCR (HOSPITAL ORDER, PERFORMED IN ~~LOC~~ HOSPITAL LAB): SARS Coronavirus 2: NEGATIVE

## 2020-06-28 LAB — CORTISOL: Cortisol, Plasma: 34.7 ug/dL

## 2020-06-28 LAB — CREATININE, URINE, RANDOM: Creatinine, Urine: 109.62 mg/dL

## 2020-06-28 MED ORDER — GABAPENTIN 100 MG PO CAPS
100.0000 mg | ORAL_CAPSULE | Freq: Two times a day (BID) | ORAL | Status: DC
  Administered 2020-06-28 – 2020-07-01 (×7): 100 mg via ORAL
  Filled 2020-06-28 (×7): qty 1

## 2020-06-28 MED ORDER — ACETAMINOPHEN 325 MG PO TABS: 650.0000 mg | ORAL_TABLET | Freq: Four times a day (QID) | ORAL | Status: DC | PRN

## 2020-06-28 MED ORDER — CHLORHEXIDINE GLUCONATE CLOTH 2 % EX PADS
6.0000 | MEDICATED_PAD | Freq: Every day | CUTANEOUS | Status: DC
  Administered 2020-06-28 – 2020-07-01 (×4): 6 via TOPICAL

## 2020-06-28 MED ORDER — ASPIRIN EC 81 MG PO TBEC
81.0000 mg | DELAYED_RELEASE_TABLET | Freq: Every day | ORAL | Status: DC
  Administered 2020-06-28: 81 mg via ORAL
  Filled 2020-06-28: qty 1

## 2020-06-28 MED ORDER — FUROSEMIDE 10 MG/ML IJ SOLN
40.0000 mg | Freq: Two times a day (BID) | INTRAMUSCULAR | Status: DC
  Administered 2020-06-28 – 2020-06-29 (×2): 40 mg via INTRAVENOUS
  Filled 2020-06-28 (×2): qty 4

## 2020-06-28 MED ORDER — ACETAMINOPHEN 650 MG RE SUPP: 650.0000 mg | Freq: Four times a day (QID) | RECTAL | Status: DC | PRN

## 2020-06-28 MED ORDER — LORAZEPAM 2 MG/ML IJ SOLN
1.0000 mg | Freq: Once | INTRAMUSCULAR | Status: AC
  Administered 2020-06-28: 1 mg via INTRAVENOUS
  Filled 2020-06-28: qty 1

## 2020-06-28 MED ORDER — INSULIN ASPART 100 UNIT/ML ~~LOC~~ SOLN
0.0000 [IU] | Freq: Three times a day (TID) | SUBCUTANEOUS | Status: DC
  Administered 2020-06-28: 3 [IU] via SUBCUTANEOUS
  Administered 2020-06-28: 5 [IU] via SUBCUTANEOUS
  Administered 2020-06-29: 3 [IU] via SUBCUTANEOUS
  Administered 2020-06-29: 5 [IU] via SUBCUTANEOUS
  Administered 2020-06-30: 3 [IU] via SUBCUTANEOUS
  Administered 2020-06-30: 5 [IU] via SUBCUTANEOUS
  Administered 2020-07-01: 2 [IU] via SUBCUTANEOUS
  Administered 2020-07-01: 3 [IU] via SUBCUTANEOUS

## 2020-06-28 MED ORDER — DIPHENHYDRAMINE HCL 25 MG PO CAPS
50.0000 mg | ORAL_CAPSULE | Freq: Once | ORAL | Status: AC
  Administered 2020-06-28: 50 mg via ORAL
  Filled 2020-06-28: qty 2

## 2020-06-28 MED ORDER — CLOPIDOGREL BISULFATE 75 MG PO TABS
75.0000 mg | ORAL_TABLET | Freq: Every day | ORAL | Status: DC
  Administered 2020-06-28 – 2020-07-01 (×4): 75 mg via ORAL
  Filled 2020-06-28 (×3): qty 1

## 2020-06-28 MED ORDER — APIXABAN 2.5 MG PO TABS
2.5000 mg | ORAL_TABLET | Freq: Two times a day (BID) | ORAL | Status: DC
  Administered 2020-06-28 – 2020-07-01 (×7): 2.5 mg via ORAL
  Filled 2020-06-28 (×8): qty 1

## 2020-06-28 NOTE — ED Notes (Signed)
Pt's daughter informed staff that pt's foley bag was leaking, had it inside ziploc gallon bag. Foley bag changed by staff per request.

## 2020-06-28 NOTE — Progress Notes (Signed)
PROGRESS NOTE    Henry Wiggins  TML:[id number] DOB: Dec 29, 1926 DOA: 06/27/2020 PCP: Antony Contras, MD   Brief Narrative:  Patient is a 86 year old male with history of hypertension, peripheral artery disease, chronic combined systolic/diastolic CHF, type 2 diabetes mellitus, CKD stage III, CVA, paroxysmal A. fib who presents with abnormal sodium level.  He was seen by his cardiologist and sent for further evaluation to the emergency department.  Patient also reported swelling of the left leg for last few weeks.  He recently had increased dose of Lasix.  He was seen by urology a week ago for urinary retention and had a Foley placed.  There is also suspicion for mild dementia and patient is waiting for referral to neurology.  Patient has been followed for over last few months.  Patient reported of decreased appetite.  He is status post AKA in June of this year secondary to gangrenous complication from peripheral artery disease. On presentation he was found to have low sodium level of 117.  Fluid overload was suspected on presentation.  Urine sodium is below 20 though.  Assessment & Plan:   Principal Problem:   Hyponatremia Active Problems:   Hypertension   PAD (peripheral artery disease) (HCC)   S/P AKA (above knee amputation) (HCC)   Chronic combined systolic and diastolic heart failure (HCC)   Type 2 diabetes mellitus with hyperglycemia ([place])   CKD stage 3 due to type 2 diabetes mellitus ([place])   Hyponatremia: Most likely this is hypervolemic hyponatremia.  Patient appears volume overloaded.  Nephrology has been consulted.  Interestingly urine sodium less than 20.  Patient on Lasix at home.   Patient's previous sodium level was in the range of 130s.  Continue to monitor BMP. Started on IV lasix BNP more than 2500.  We will continue to monitor sodium level.  CKD stage IIIa: Currently kidney function stable.  Continue to monitor kidney function.  Avoid nephrotoxins  Hypertension:  Currently blood pressure stable.  Continue current medications  Peripheral artery disease: Follows with vascular surgery.  Status post right AKA.  Diabetes type 2: Continue sliding scale insulin.  Monitor CBGs.  Zero 7.6.  Chronic combined systolic/diastolic heart failure: Takes Lasix at home.  Follows with cardiology.  Echo done in 12/2019 showed ejection fraction of 30 to 68%, grade 2 diastolic dysfunction.  Paroxysmal A. fib: Currently in normal sinus rhythm.  On anticoagulation with Eliquis.  Currently rate is controlled.  History of stroke: On Plavix  Dementia: Plan for follow up with neurology as outpatient,has confusion at baseline         DVT prophylaxis: Eliquis Code Status: DNR Family Communication: Called and discussed with  daughter on phone on 06/28/20 Status is: Inpatient  Remains inpatient appropriate because:IV treatments appropriate due to intensity of illness or inability to take PO   Dispo: The patient is from: Home              Anticipated d/c is to: SNF              Anticipated d/c date is: 2 days              Patient currently is not medically stable to d/c.    Consultants: Nephrology  Procedures: None  Antimicrobials:  Anti-infectives (From admission, onward)   None      Subjective: Patient seen and examined at the bedside this morning.  Hemodynamically stable.  Very comfortable, eating his breakfast.  Pleasantly confused. Appeared volume overloaded.  Not in  any kind of distress.  Objective: Vitals:   06/28/20 0015 06/28/20 0145 06/28/20 0415 06/28/20 0654  BP: 122/84 (!) 149/82 138/80 140/80  Pulse: 74 78  69  Resp: 17 20 19 17   Temp:   98.4 F (36.9 C) 98.4 F (36.9 C)  TempSrc:   Oral   SpO2: 100% 100% 98% 98%   No intake or output data in the 24 hours ending 06/28/20 0850 There were no vitals filed for this visit.  Examination:  General exam: Pleasant confused elderly male, comfortable Respiratory system: Few crackles in the  bases  cardiovascular system: S1 & S2 heard, RRR. No JVD, murmurs, rubs, gallops or clicks.  Gastrointestinal system: Abdomen is nondistended, soft and nontender. No organomegaly or masses felt. Normal bowel sounds heard. Central nervous system: Alert and awake but not oriented Extremities: 1-2+ pitting left lower extremity edema, no clubbing ,no cyanosis,right AKA Skin: No rashes, lesions or ulcers,no icterus ,no pallor  Data Reviewed: I have personally reviewed following labs and imaging studies  CBC: Recent Labs  Lab 06/27/20 0937 06/27/20 1949 06/28/20 0757  WBC 10.0 11.0* 12.0*  HGB 11.6* 11.3* 10.9*  HCT 33.5* 33.8* 32.8*  MCV 85 85.4 86.5  PLT 234 254 413   Basic Metabolic Panel: Recent Labs  Lab 06/27/20 0937 06/27/20 1949  NA 118* 117*  K 5.1 4.5  CL 79* 82*  CO2 21 24  GLUCOSE 204* 215*  BUN 24 27*  CREATININE 1.43* 1.39*  CALCIUM 9.0 8.9   GFR: CrCl cannot be calculated (Unknown ideal weight.). Liver Function Tests: No results for input(s): AST, ALT, ALKPHOS, BILITOT, PROT, ALBUMIN in the last 168 hours. No results for input(s): LIPASE, AMYLASE in the last 168 hours. No results for input(s): AMMONIA in the last 168 hours. Coagulation Profile: No results for input(s): INR, PROTIME in the last 168 hours. Cardiac Enzymes: No results for input(s): CKTOTAL, CKMB, CKMBINDEX, TROPONINI in the last 168 hours. BNP (last 3 results) No results for input(s): PROBNP in the last 8760 hours. HbA1C: Recent Labs    06/28/20 0757  HGBA1C 7.6*   CBG: Recent Labs  Lab 06/28/20 0808  GLUCAP 206*   Lipid Profile: No results for input(s): CHOL, HDL, LDLCALC, TRIG, CHOLHDL, LDLDIRECT in the last 72 hours. Thyroid Function Tests: No results for input(s): TSH, T4TOTAL, FREET4, T3FREE, THYROIDAB in the last 72 hours. Anemia Panel: No results for input(s): VITAMINB12, FOLATE, FERRITIN, TIBC, IRON, RETICCTPCT in the last 72 hours. Sepsis Labs: No results for input(s):  PROCALCITON, LATICACIDVEN in the last 168 hours.  Recent Results (from the past 240 hour(s))  SARS Coronavirus 2 by RT PCR (hospital order, performed in [facility] hospital lab) Nasopharyngeal Nasopharyngeal Swab     Status: None   Collection Time: 06/27/20 11:41 PM   Specimen: Nasopharyngeal Swab  Result Value Ref Range Status   SARS Coronavirus 2 NEGATIVE NEGATIVE Final    Comment: (NOTE) SARS-CoV-2 target nucleic acids are NOT DETECTED.  The SARS-CoV-2 RNA is generally detectable in upper and lower respiratory specimens during the acute phase of infection. The lowest concentration of SARS-CoV-2 viral copies this assay can detect is 250 copies / mL. A negative result does not preclude SARS-CoV-2 infection and should not be used as the sole basis for treatment or other patient management decisions.  A negative result may occur with improper specimen collection / handling, submission of specimen other than nasopharyngeal swab, presence of viral mutation(s) within the areas targeted by this assay, and inadequate number  of viral copies (<250 copies / mL). A negative result must be combined with clinical observations, patient history, and epidemiological information.  Fact Sheet for Patients:   StrictlyIdeas.no  Fact Sheet for Healthcare Providers: BankingDealers.co.za  This test is not yet approved or  cleared by the [place] FDA and has been authorized for detection and/or diagnosis of SARS-CoV-2 by FDA under an Emergency Use Authorization (EUA).  This EUA will remain in effect (meaning this test can be used) for the duration of the COVID-19 declaration under Section 564(b)(1) of the Act, 21 U.S.C. section 360bbb-3(b)(1), unless the authorization is terminated or revoked sooner.  Performed at [facility], [place] [address], [place], [place] [address]          Radiology Studies: DG Chest 1 View  Result Date:  06/27/2020 CLINICAL DATA:  Shortness of breath with left lower leg edema x2 weeks. EXAM: CHEST  1 VIEW COMPARISON:  December 31, 2019 FINDINGS: Moderate severity areas of atelectasis and/or infiltrate are seen within the bilateral lung bases, right greater than left. Small bilateral pleural effusions are also seen, right greater than left. No pneumothorax is identified. The cardiac silhouette is markedly enlarged. There is mild calcification of the aortic arch. Multilevel degenerative changes seen throughout the thoracic spine. IMPRESSION: 1. Moderate severity bibasilar atelectasis and/or infiltrate, right greater than left. 2. Small bilateral pleural effusions, right greater than left. 3. Cardiomegaly. Electronically Signed   By: Virgina Norfolk M.D.   On: 06/27/2020 20:34        Scheduled Meds: . aspirin EC  81 mg Oral Daily  . insulin aspart  0-15 Units Subcutaneous TID WC  . sodium chloride flush  3 mL Intravenous Once   Continuous Infusions:   LOS: 0 days    Time spent: 35 mins,More than 50% of that time was spent in counseling and/or coordination of care.      Shelly Coss, MD [facility] P7/07/2020, 8:50 AM

## 2020-06-28 NOTE — ED Notes (Signed)
Daughter leaving to go to work, says she will return later today. Told that staff will contact with any urgent news/change in pt condition

## 2020-06-28 NOTE — TOC Initial Note (Signed)
Transition of Care [facility]) - Initial/Assessment Note    Patient Details  Name: Henry Wiggins MRN: [id number] Date of Birth: [birth year]  Transition of Care [facility]) CM/SW Contact:    Verdell Carmine, RN Phone Number: 06/28/2020, 2:56 PM  Clinical Narrative:                 Patient admitted with hyponitremia leg swelling shortness of breath lying  Flat. Has been fully vaccinated  For COVID,  COVID negative.   Has had to have a foley inserted in the past week due to urinary retention.  He has had a increase in lasix by cards.  He was noted to have a sodium level of 118 in the office and instructed to go to the ED.  EF 30-35%  He  Has a right AKA from January.  Has OT on outpatient basis. Plan: More aggressive diuresis and supportive care, will follow up with paitent regarding need for in home services versus continuing outpatient services closer to discharge.    basisBarriers to Discharge: Continued Medical Work up   Patient Goals and CMS Choice Patient states their goals for this hospitalization and ongoing recovery are:: Has foley at home put in last week.due to retention      Expected Discharge Plan and Services Expected Discharge Plan: Home/Self Care   Discharge Planning Services: CM Consult   Living arrangements for the past 2 months: Single Family Home                                      Prior Living Arrangements/Services Living arrangements for the past 2 months: Single Family Home Lives with:: Adult Children          Need for Family Participation in Patient Care: Yes (Comment) Care giver support system in place?: Yes (comment) Current home services: DME Criminal Activity/Legal Involvement Pertinent to Current Situation/Hospitalization: No - Comment as needed  Activities of Daily Living      Permission Sought/Granted                  Emotional Assessment           Psych Involvement: No (comment)  Admission diagnosis:  Acute on chronic systolic  heart failure (HCC) [I50.23] Hyponatremia [E87.1] Other hypervolemia [E87.79] Patient Active Problem List   Diagnosis Date Noted  . Hyponatremia 06/28/2020  . Type 2 diabetes mellitus with hyperglycemia ([place]) 06/28/2020  . CKD stage 3 due to type 2 diabetes mellitus ([place]) 06/28/2020  . Chronic combined systolic and diastolic heart failure ([place]) 01/12/2020  . Paroxysmal atrial fibrillation (HCC)   . PAD (peripheral artery disease) ([place]) 12/29/2019  . S/P AKA (above knee amputation) ([place]) 12/29/2019  . Encounter for loop recorder check 09/05/2015  . Stroke ([place]) 05/27/2015  . CVA (cerebral infarction) 05/26/2015  . Diabetes ([place]) 05/26/2015  . Hypertension 05/26/2015  . Hyperlipidemia 05/26/2015  . Acute renal failure ([place]) 05/26/2015   PCP:  Antony Contras, MD Pharmacy:   [facility] # [address] [place] [address] [place] [address] Phone: [phone] Fax: [phone]     Social Determinants of Health (SDOH) Interventions    Readmission Risk Interventions Readmission Risk Prevention Plan 01/03/2020  Post Dischage Appt Complete  Medication Screening Complete  Transportation Screening Complete  Some recent data might be hidden

## 2020-06-28 NOTE — H&P (Signed)
History and Physical    Rush Salce Appleman PRF:[id number] DOB: [birth year] DOA: 06/27/2020  PCP: Antony Contras, MD (Confirm with patient/family/NH records and if not entered, this has to be entered at [facility] point of entry) Patient coming from: Home  I have personally briefly reviewed patient's old medical records in [place]  Chief Complaint: Weakness and called to come to the ER from cardiologist due to abnormal sodium level  HPI: Jayzen Paver Sanguinetti is a 86 y.o. male with medical history significant of hypertension, peripheral artery disease, chronic systolic and diastolic CHF, type 2 diabetes mellitus, CKD stage III, history of CVA, atrial fibrillation who presents with abnormal sodium level.  Patient was seen by his cardiologist on the morning of June 27, 2020 and had labs drawn.  In the late afternoon of June 27, 2020 cardiology office called patient's daughter and informed her to take Mr. Gaillard to the emergency room as his sodium level was low.  He has been having swelling of his left leg for the last few weeks and had his Lasix dose increased a week ago.  He was also seen by urology as an outpatient a week ago due to urinary retention and swelling and had a Foley catheter placed at that time.  Daughter states a Foley catheter was to be removed tomorrow when you return to urology clinic.  Daughter reports he has had good diuretic response from elevated Lasix dose of 80 mg a day for the last week.  She also reports that he has not been eating very well the last few weeks and had seen his PCP a few weeks ago and was started on Lexapro for suspected depression.  Daughter is concerned that he may also have some mild dementia and is awaiting a referral to neurology.  Reports he has been very forgetful over the last few months but has not had personality change or agitation.  But he has had a decreased appetite and he states he is not hungry.  She has tried to keep him hydrated by giving him protein shakes  and pediatric Gatorade over the last few weeks.  She states the cardiologist told her to stop giving him Gatorade.  She states he has not complained of any chest pain, shortness of breath.  States he has not had any nausea, vomiting, diarrhea, fever or cough at home.  He had a right AKA in January this year after having vascular procedure on stenotic arteries in his leg that resulted in gangrene complication leading to the amputation.  He normally gets into wheelchair and furniture with assistance.  ED Course: In the emergency room he has been found to have a low sodium level.  He does have some mild edema of his left leg and ER provider felt that he was fluid overloaded so did not give any fluid.  I have asked for a urine sodium and urine creatinine level to be obtained to help determine patient's intravascular fluid status and if IV fluid versus fluid restriction will be required.  Review of Systems:  General: Denies weakness, fever, chills, weight loss, night sweats.  Denies dizziness.  Eyes change in appetite HENT: Denies head trauma, headache, denies change in hearing, tinnitus.  Denies nasal congestion or bleeding.  Denies sore throat, sores in mouth.  Denies difficulty swallowing Eyes: Denies blurry vision, pain in eye, drainage.  Denies discoloration of eyes. Neck: Denies pain.  Denies swelling.  Denies pain with movement. Cardiovascular: Denies chest pain, palpitations.  Has edema  of left leg.  Denies orthopnea Respiratory: Denies shortness of breath, cough.  Denies wheezing.  Denies sputum production Gastrointestinal: Denies abdominal pain, swelling.  Denies nausea, vomiting, diarrhea.  Denies melena.  Denies hematemesis. Musculoskeletal: Has right AKA. Denies limitation of movement of upper extremities or left lower extremity.  Denies pain.  Denies arthralgias or myalgias. Genitourinary: Denies pelvic pain.  Denies urinary frequency or hesitancy.  Denies dysuria. Has indwelling foley.  Skin:  Denies rash.  Denies petechiae, purpura, ecchymosis. Neurological: Denies headache.  Denies syncope.  Denies seizure activity.  Denies weakness or paresthesia.  Slurred speech, drooping face.  Denies visual change.   Past Medical History:  Diagnosis Date  . Anxiety   . Arthritis   . Bladder cancer (HCC)    Dr. Janice Norrie  . CHF (congestive heart failure) ([place])   . Chronic kidney disease   . Depression   . Diabetes ([place])    type 2  . Family history of adverse reaction to anesthesia    Daughter stated that she is difficult to wake up.slow to wake up  . Full dentures   . GERD (gastroesophageal reflux disease)   . HOH (hard of hearing)   . Hyperlipidemia   . Hypertension   . Memory loss   . Peripheral vascular disease ([place])   . Stroke ([place])   . UTI (lower urinary tract infection)     Past Surgical History:  Procedure Laterality Date  . ABDOMINAL AORTOGRAM W/LOWER EXTREMITY Bilateral 12/19/2019   Procedure: ABDOMINAL AORTOGRAM W/LOWER EXTREMITY;  Surgeon: Waynetta Sandy, MD;  Location: [place] [facility];  Service: Cardiovascular;  Laterality: Bilateral;  . ABDOMINAL AORTOGRAM W/LOWER EXTREMITY Left 03/05/2020   Procedure: ABDOMINAL AORTOGRAM W/LOWER EXTREMITY;  Surgeon: Waynetta Sandy, MD;  Location: [place] [facility];  Service: Cardiovascular;  Laterality: Left;  . AMPUTATION Right 12/29/2019   Procedure: AMPUTATION ABOVE KNEE;  Surgeon: Waynetta Sandy, MD;  Location: [place];  Service: Vascular;  Laterality: Right;  . APPENDECTOMY    . CARPAL TUNNEL RELEASE Right   . CATARACT EXTRACTION Bilateral   . CHOLECYSTECTOMY    . COLONOSCOPY    . ENDOVASCULAR REPAIR/STENT GRAFT Left 03/05/2020   Procedure: ENDOVASCULAR REPAIR/STENT GRAFT;  Surgeon: Waynetta Sandy, MD;  Location: [place] [facility];  Service: Cardiovascular;  Laterality: Left;  PERONEAL  . EP IMPLANTABLE DEVICE N/A 05/28/2015   Procedure: Loop Recorder Insertion;  Surgeon: Sanda Klein, MD;  Location: [place] [facility];  Service: Cardiovascular;  Laterality: N/A;  . HERNIA REPAIR     x4  . MULTIPLE TOOTH EXTRACTIONS    . PERIPHERAL VASCULAR ATHERECTOMY Left 03/05/2020   Procedure: PERIPHERAL VASCULAR ATHERECTOMY;  Surgeon: Waynetta Sandy, MD;  Location: [place] [facility];  Service: Cardiovascular;  Laterality: Left;  PERONEAL  . PERIPHERAL VASCULAR BALLOON ANGIOPLASTY Right 12/19/2019   Procedure: PERIPHERAL VASCULAR BALLOON ANGIOPLASTY;  Surgeon: Waynetta Sandy, MD;  Location: [place] [facility];  Service: Cardiovascular;  Laterality: Right;  Anterior tibial artery  . TEE WITHOUT CARDIOVERSION N/A 05/28/2015   Procedure: TRANSESOPHAGEAL ECHOCARDIOGRAM (TEE);  Surgeon: Sanda Klein, MD;  Location: [facility];  Service: Cardiovascular;  Laterality: N/A;  . TONSILLECTOMY      Social History  reports that he quit smoking about 66 years ago. His smoking use included cigarettes. He has a 2.50 pack-year smoking history. He has never used smokeless tobacco. He reports previous alcohol use. He reports current drug use. Drug: Oxycodone.  No Known Allergies  Family History  Problem Relation Age of Onset  . Hypertension Mother   . Hypertension Father     Prior to Admission medications   Medication Sig Start Date End Date Taking? Authorizing Provider  apixaban (ELIQUIS) 2.5 MG TABS tablet Take 1 tablet (2.5 mg total) by mouth 2 (two) times daily. 06/08/20  Yes Croitoru, Mihai, MD  Cholecalciferol (VITAMIN D) 50 MCG (2000 UT) tablet Take 2,000 Units by mouth daily.   Yes [provider]  clopidogrel (PLAVIX) 75 MG tablet Take 1 tablet (75 mg total) by mouth daily. 06/08/20 06/08/21 Yes Waynetta Sandy, MD  escitalopram (LEXAPRO) 5 MG tablet Take 5 mg by mouth daily. 06/11/20  Yes [provider]  furosemide (LASIX) 40 MG tablet Take 1 tablet (40 mg total) by mouth daily. 80MG x3 DAYS THEN BACK TO 40MG Patient taking  differently: Take 40-80 mg by mouth See admin instructions. Take 2 tablets for 3 days (started 06/27/20) then back to 1 tablet daily 06/27/20  Yes Cleaver, Jossie Ng, NP  gabapentin (NEURONTIN) 100 MG capsule Take 100 mg by mouth 2 (two) times daily.    Yes [provider]  linagliptin (TRADJENTA) 5 MG TABS tablet Take 1 tablet (5 mg total) by mouth daily. 01/04/20  Yes Regalado, Belkys A, MD  Multiple Vitamins-Minerals (PRESERVISION AREDS 2) CAPS Take 1 tablet by mouth daily.   Yes [provider]  silodosin (RAPAFLO) 4 MG CAPS capsule Take 4 mg by mouth daily. 06/11/20  Yes [provider]  blood glucose meter kit and supplies Dispense based on patient and insurance preference. Use up to four times daily as directed. (FOR ICD-10 E10.9, E11.9). 01/04/20   Regalado, Cassie Freer, MD  ONETOUCH VERIO test strip FOR USE WHEN CHECKING BLOOD GLUCOSE ONCE DAILY, ALTERNATING AM & PM, BEFORE MEALS E11.69 01/23/20   [provider]  potassium chloride SA (KLOR-CON M20) 20 MEQ tablet Take 1 tablet (20 mEq total) by mouth daily. 40MEQ x3 DAYS THEN STOP Patient taking differently: Take 40 mEq by mouth See admin instructions. Take 2 tablets for 3 days 06/27/20 09/25/20  Deberah Pelton, NP    Physical Exam: Vitals:   06/27/20 2232 06/27/20 2315 06/28/20 0015 06/28/20 0145  BP: (!) 145/69 (!) 161/91 122/84 (!) 149/82  Pulse: 77 93 74 78  Resp: _0 Temp: 98.3 F (36.8 C)     TempSrc: Oral     SpO2: 96% 95% 100% 100%    Constitutional: NAD, calm, comfortable Vitals:   06/27/20 2232 06/27/20 2315 06/28/20 0015 06/28/20 0145  BP: (!) 145/69 (!) 161/91 122/84 (!) 149/82  Pulse: 77 93 74 78  Resp: _1 Temp: 98.3 F (36.8 C)     TempSrc: Oral     SpO2: 96% 95% 100% 100%   General:  WDWN obese male.  Eyes: PERRL, lids and conjunctivae normal.  Sclera nonicteric HENT: [place]/AT. Ears without external lesions. Mucous membranes are moist. Posterior pharynx clear of any  exudate or lesions.  Neck: normal, supple, no masses, no thyromegaly. Trachea midline.  Respiratory: clear to auscultation bilaterally, no wheezing, no crackles. Normal respiratory effort. No accessory muscle use.  Cardiovascular: Regular rate and rhythm, no murmurs / rubs / gallops. Has left lower extremity edema +1. DP pulse +1 left foot.   Abdomen: Soft, obese. no tenderness,No rebound. no masses palpated. Normal active bowel sounds   Musculoskeletal: no clubbing / cyanosis. No joint deformity upper and lower extremities. Good  ROM, Right AKA. Normal muscle tone.  Skin: no rashes, lesions, ulcers. No induration Neurologic: CN 2-12 grossly intact. Sensation intact, DTR normal. Strength 5/5 in all 4.  Psychiatric: Alert and oriented to person and place. Normal mood.    Labs on Admission: I have personally reviewed following labs and imaging studies  CBC: Recent Labs  Lab 06/27/20 0937 06/27/20 1949  WBC 10.0 11.0*  HGB 11.6* 11.3*  HCT 33.5* 33.8*  MCV 85 85.4  PLT 234 973    Basic Metabolic Panel: Recent Labs  Lab 06/27/20 0937 06/27/20 1949  NA 118* 117*  K 5.1 4.5  CL 79* 82*  CO2 21 24  GLUCOSE 204* 215*  BUN 24 27*  CREATININE 1.43* 1.39*  CALCIUM 9.0 8.9    GFR: CrCl cannot be calculated (Unknown ideal weight.).  Liver Function Tests: No results for input(s): AST, ALT, ALKPHOS, BILITOT, PROT, ALBUMIN in the last 168 hours.  Urine analysis:    Component Value Date/Time   COLORURINE STRAW (A) 12/31/2019 1746   APPEARANCEUR CLEAR 12/31/2019 1746   LABSPEC 1.009 12/31/2019 1746   PHURINE 5.0 12/31/2019 1746   GLUCOSEU NEGATIVE 12/31/2019 1746   HGBUR NEGATIVE 12/31/2019 1746   BILIRUBINUR NEGATIVE 12/31/2019 1746   KETONESUR NEGATIVE 12/31/2019 1746   PROTEINUR NEGATIVE 12/31/2019 1746   UROBILINOGEN 0.2 05/27/2015 0223   NITRITE NEGATIVE 12/31/2019 1746   LEUKOCYTESUR NEGATIVE 12/31/2019 1746    Radiological Exams on Admission: DG Chest 1  View  Result Date: 06/27/2020 CLINICAL DATA:  Shortness of breath with left lower leg edema x2 weeks. EXAM: CHEST  1 VIEW COMPARISON:  December 31, 2019 FINDINGS: Moderate severity areas of atelectasis and/or infiltrate are seen within the bilateral lung bases, right greater than left. Small bilateral pleural effusions are also seen, right greater than left. No pneumothorax is identified. The cardiac silhouette is markedly enlarged. There is mild calcification of the aortic arch. Multilevel degenerative changes seen throughout the thoracic spine. IMPRESSION: 1. Moderate severity bibasilar atelectasis and/or infiltrate, right greater than left. 2. Small bilateral pleural effusions, right greater than left. 3. Cardiomegaly. Electronically Signed   By: Virgina Norfolk M.D.   On: 06/27/2020 20:34    EKG: Independently reviewed.  Normal sinus rhythm.  No acute ST elevation or depression.  Assessment/Plan Principal Problem:   Hyponatremia Active Problems:   Hypertension   PAD (peripheral artery disease) (HCC)   S/P AKA (above knee amputation) (HCC)   Chronic combined systolic and diastolic heart failure (HCC)   Type 2 diabetes mellitus with hyperglycemia ([place])   CKD stage 3 due to type 2 diabetes mellitus [facility])   Mr. Deyoung is admitted to Medical floor.  Urine sodium and urine osmolality levels are ordered and pending.  Will need to calculate fractional excretion of sodium in light of hyponatremia with stable renal function per patient.  This will determine if patient is intravascularly hypovolemic versus hypervolemic.  If patient has fractional excretion of sodium less than 0.1% he would be hypovolemic and if it is greater than 0.1% to be hypervolemic versus euvolemic.  If he is hypovolemic will provide gentle IV fluid hydration.  If the urine osmolality and patient was started on Lexapro a few weeks ago going to the daughter and SSRI medications are known to cause hyponatremia.  Therefore Lexapro will  be stopped at this time. Recheck electrolytes and renal function morning. Home medications will be reconciled by pharmacy in the morning and resumed.  Daughter reports the patient did not have  his evening dose of Eliquis tonight so we will provide that. Patient replaced on diabetic diet.  Monitor blood sugar before every meal nightly.  Sliding scale insulin will be provided as needed for glycemic control. Check hemoglobin A1c Monitor blood pressure.   DVT prophylaxis: Patient is anticoagulated on Eliquis which would be continued (Lovenox/Heparin/SCD's/anticoagulated/None (if comfort care) Code Status:   DNR (Full/Partial (specify details) Family Communication:  Diagnosis plan discussed with patient and his daughter who is at bedside.  Daughter states understanding and agrees with plan.  Further recommendations to follow as clinically indicated.  (Specify name, relationship. Do not write "discussed with patient". Specify tel # if discussed over the phone) Disposition Plan:   Patient is from:  Home  Anticipated DC to:  Home  Anticipated DC date:  July 10  Anticipated DC barriers: None   Admission status:  Inpatient (inpatient / obs / tele / medical floor / SDU)  Severity of Illness: The appropriate patient status for this patient is INPATIENT. Inpatient status is judged to be reasonable and necessary in order to provide the required intensity of service to ensure the patient's safety. The patient's presenting symptoms, physical exam findings, and initial radiographic and laboratory data in the context of their chronic comorbidities is felt to place them at high risk for further clinical deterioration. Furthermore, it is not anticipated that the patient will be medically stable for discharge from the hospital within 2 midnights of admission. The following factors support the patient status of inpatient.   " The patient's presenting symptoms include abnormal sodium level decreased energy  level. " The worrisome physical exam findings include edema of leg.  " The initial radiographic and laboratory data are worrisome because of low sodium level. " The chronic co-morbidities include diabetes mellitus type 2, CKD stage III, paroxysmal atrial fibrillation, history of CVA, depression   * I certify that at the point of admission it is my clinical judgment that the patient will require inpatient hospital care spanning beyond 2 midnights from the point of admission due to high intensity of service, high risk for further deterioration and high frequency of surveillance required.Yevonne Aline Jolisa Intriago MD [facility]  How to contact the [facility] Attending or Consulting provider [place] or covering provider during after hours [place], for this patient?   1. Check the care team in [facility] and look for a) attending/consulting TR[facility] provider listed and b) the [facility] team listed 2. Log into [email or web address] and use [place]'s universal password to access. If you do not have the password, please contact the hospital operator. 3. Locate the [facility] provider you are looking for under [facility] and page to a number that you can be directly reached. 4. If you still have difficulty reaching the provider, please page the [facility] (Director on Call) for the Hospitalists listed on amion for assistance.  06/28/2020, 2:12 AM

## 2020-06-28 NOTE — Consult Note (Signed)
Reason for Consult:Hyponatremia Referring Physician: Tawanna Solo, MD  Henry Wiggins is an 86 y.o. male has a PMH significant for HTN, CVA, P. Atrial fibrillation, chronic combined systolic and diastolic CHF, CKD stage IIIa, BOO, PAD s/p R AKA, HLD, and DM type 2 who presented to outpatient cardiology visit yesterday due to a 2 week history of increasing lower extremity edema and shortness of breath.  He was noted to have 2+ pitting edema and had his lasix dose increased to 80 mg, however labs were drawn and were notable for a serum sodium of 118.  His daughter was instructed to take him directly to [facility] ED for further evaluation.  We were consulted to help manage and evaluate his hyponatremia.  The trend in serum sodium as well as creatinine is seen below.  His daughter reports that his UOP decreased over the past few days because he ran out of rapaflo but was renewed by Dr. Jeffie Pollock this week.  He has not received any diuretics since his admission yesterday.  He denies any N/V but still has some SOB.  Trend in Serum Sodium: Sodium  Date/Time Value Ref Range Status  06/28/2020 07:57 AM 118 (LL) 135 - 145 mmol/L Final  06/27/2020 07:49 PM 117 (LL) 135 - 145 mmol/L Final  06/27/2020 09:37 AM 118 (LL) 134 - 144 mmol/L Final  03/05/2020 09:17 AM 141 135 - 145 mmol/L Final  01/04/2020 08:46 AM 134 (L) 135 - 145 mmol/L Final  01/03/2020 08:13 AM 135 135 - 145 mmol/L Final  01/02/2020 09:52 AM 133 (L) 135 - 145 mmol/L Final  01/02/2020 06:58 AM 132 (L) 135 - 145 mmol/L Final  01/02/2020 02:47 AM 162 (HH) 135 - 145 mmol/L Corrected  01/01/2020 01:14 AM 136 135 - 145 mmol/L Final  12/31/2019 08:15 AM 136 135 - 145 mmol/L Final  12/31/2019 01:39 AM 138 135 - 145 mmol/L Final  12/30/2019 02:43 AM 138 135 - 145 mmol/L Final  12/29/2019 11:20 AM 139 135 - 145 mmol/L Final  12/19/2019 02:20 PM 138 135 - 145 mmol/L Final  12/19/2019 11:28 AM 138 135 - 145 mmol/L Final  12/19/2019 10:46 AM 135 135 - 145 mmol/L  Final  05/28/2015 05:55 AM 137 135 - 145 mmol/L Final  05/27/2015 11:36 AM 135 135 - 145 mmol/L Final  05/26/2015 11:01 PM 139 135 - 145 mmol/L Final  05/26/2015 10:44 PM 139 135 - 145 mmol/L Final  08/20/2010 10:10 AM 141 135 - 145 mEq/L Final  07/30/2010 03:20 AM 139 135 - 145 mEq/L Final  07/04/2010 02:45 PM 140 135 - 145 mEq/L Final    Trend in Serum Creatinine Creatinine, Ser  Date/Time Value Ref Range Status  06/28/2020 07:57 AM 1.46 (H) 0.61 - 1.24 mg/dL Final  06/27/2020 07:49 PM 1.39 (H) 0.61 - 1.24 mg/dL Final  06/27/2020 09:37 AM 1.43 (H) 0.76 - 1.27 mg/dL Final  03/05/2020 09:17 AM 1.40 (H) 0.61 - 1.24 mg/dL Final  01/04/2020 08:46 AM 1.99 (H) 0.61 - 1.24 mg/dL Final  01/03/2020 08:13 AM 1.98 (H) 0.61 - 1.24 mg/dL Final  01/02/2020 09:52 AM 1.98 (H) 0.61 - 1.24 mg/dL Final  01/02/2020 06:58 AM 1.97 (H) 0.61 - 1.24 mg/dL Final  01/02/2020 02:47 AM 1.57 (H) 0.61 - 1.24 mg/dL Final  01/01/2020 01:14 AM 1.88 (H) 0.61 - 1.24 mg/dL Final  12/31/2019 08:15 AM 2.10 (H) 0.61 - 1.24 mg/dL Final  12/31/2019 01:39 AM 1.96 (H) 0.61 - 1.24 mg/dL Final  12/30/2019 02:43 AM 1.76 (H) 0.61 -  1.24 mg/dL Final  12/29/2019 11:20 AM 2.02 (H) 0.61 - 1.24 mg/dL Final  12/19/2019 11:28 AM 1.76 (H) 0.61 - 1.24 mg/dL Final  12/19/2019 10:46 AM 1.50 (H) 0.61 - 1.24 mg/dL Final  05/28/2015 05:55 AM 1.12 0.61 - 1.24 mg/dL Final  05/27/2015 11:36 AM 1.14 0.61 - 1.24 mg/dL Final  05/26/2015 11:01 PM 1.60 (H) 0.61 - 1.24 mg/dL Final  05/26/2015 10:44 PM 1.66 (H) 0.61 - 1.24 mg/dL Final  08/20/2010 10:10 AM 1.23 0.40 - 1.50 mg/dL Final  07/30/2010 03:20 AM 1.24 0.40 - 1.50 mg/dL Final  07/04/2010 02:45 PM 1.18 0.40 - 1.50 mg/dL Final   PMH:   Past Medical History:  Diagnosis Date  . Anxiety   . Arthritis   . Bladder cancer (HCC)    Dr. Janice Norrie  . CHF (congestive heart failure) ([place])   . Chronic kidney disease   . Depression   . Diabetes ([place])    type 2  . Family history of adverse reaction to  anesthesia    Daughter stated that she is difficult to wake up.slow to wake up  . Full dentures   . GERD (gastroesophageal reflux disease)   . HOH (hard of hearing)   . Hyperlipidemia   . Hypertension   . Memory loss   . Peripheral vascular disease ([place])   . Stroke ([place])   . UTI (lower urinary tract infection)     PSH:   Past Surgical History:  Procedure Laterality Date  . ABDOMINAL AORTOGRAM W/LOWER EXTREMITY Bilateral 12/19/2019   Procedure: ABDOMINAL AORTOGRAM W/LOWER EXTREMITY;  Surgeon: Waynetta Sandy, MD;  Location: [place] [facility];  Service: Cardiovascular;  Laterality: Bilateral;  . ABDOMINAL AORTOGRAM W/LOWER EXTREMITY Left 03/05/2020   Procedure: ABDOMINAL AORTOGRAM W/LOWER EXTREMITY;  Surgeon: Waynetta Sandy, MD;  Location: [place] [facility];  Service: Cardiovascular;  Laterality: Left;  . AMPUTATION Right 12/29/2019   Procedure: AMPUTATION ABOVE KNEE;  Surgeon: Waynetta Sandy, MD;  Location: [place];  Service: Vascular;  Laterality: Right;  . APPENDECTOMY    . CARPAL TUNNEL RELEASE Right   . CATARACT EXTRACTION Bilateral   . CHOLECYSTECTOMY    . COLONOSCOPY    . ENDOVASCULAR REPAIR/STENT GRAFT Left 03/05/2020   Procedure: ENDOVASCULAR REPAIR/STENT GRAFT;  Surgeon: Waynetta Sandy, MD;  Location: [place] [facility];  Service: Cardiovascular;  Laterality: Left;  PERONEAL  . EP IMPLANTABLE DEVICE N/A 05/28/2015   Procedure: Loop Recorder Insertion;  Surgeon: Sanda Klein, MD;  Location: [place] [facility];  Service: Cardiovascular;  Laterality: N/A;  . HERNIA REPAIR     x4  . MULTIPLE TOOTH EXTRACTIONS    . PERIPHERAL VASCULAR ATHERECTOMY Left 03/05/2020   Procedure: PERIPHERAL VASCULAR ATHERECTOMY;  Surgeon: Waynetta Sandy, MD;  Location: [place] [facility];  Service: Cardiovascular;  Laterality: Left;  PERONEAL  . PERIPHERAL VASCULAR BALLOON ANGIOPLASTY Right 12/19/2019   Procedure: PERIPHERAL VASCULAR BALLOON  ANGIOPLASTY;  Surgeon: Waynetta Sandy, MD;  Location: [place] [facility];  Service: Cardiovascular;  Laterality: Right;  Anterior tibial artery  . TEE WITHOUT CARDIOVERSION N/A 05/28/2015   Procedure: TRANSESOPHAGEAL ECHOCARDIOGRAM (TEE);  Surgeon: Sanda Klein, MD;  Location: [facility];  Service: Cardiovascular;  Laterality: N/A;  . TONSILLECTOMY      Allergies: No Known Allergies  Medications:   Prior to Admission medications   Medication Sig Start Date End Date Taking? Authorizing Provider  apixaban (ELIQUIS) 2.5 MG TABS tablet Take 1 tablet (2.5 mg total) by mouth 2 (  two) times daily. 06/08/20  Yes Croitoru, Mihai, MD  Cholecalciferol (VITAMIN D) 50 MCG (2000 UT) tablet Take 2,000 Units by mouth daily.   Yes [provider]  clopidogrel (PLAVIX) 75 MG tablet Take 1 tablet (75 mg total) by mouth daily. 06/08/20 06/08/21 Yes Waynetta Sandy, MD  escitalopram (LEXAPRO) 5 MG tablet Take 5 mg by mouth daily. 06/11/20  Yes [provider]  furosemide (LASIX) 40 MG tablet Take 1 tablet (40 mg total) by mouth daily. 80MG x3 DAYS THEN BACK TO 40MG Patient taking differently: Take 40-80 mg by mouth See admin instructions. Take 2 tablets for 3 days (started 06/27/20) then back to 1 tablet daily 06/27/20  Yes Cleaver, Jossie Ng, NP  gabapentin (NEURONTIN) 100 MG capsule Take 100 mg by mouth 2 (two) times daily.    Yes [provider]  linagliptin (TRADJENTA) 5 MG TABS tablet Take 1 tablet (5 mg total) by mouth daily. 01/04/20  Yes Regalado, Belkys A, MD  Multiple Vitamins-Minerals (PRESERVISION AREDS 2) CAPS Take 1 tablet by mouth daily.   Yes [provider]  silodosin (RAPAFLO) 4 MG CAPS capsule Take 4 mg by mouth daily. 06/11/20  Yes [provider]  blood glucose meter kit and supplies Dispense based on patient and insurance preference. Use up to four times daily as directed. (FOR ICD-10 E10.9, E11.9). 01/04/20   Regalado, Cassie Freer, MD   ONETOUCH VERIO test strip FOR USE WHEN CHECKING BLOOD GLUCOSE ONCE DAILY, ALTERNATING AM & PM, BEFORE MEALS E11.69 01/23/20   [provider]  potassium chloride SA (KLOR-CON M20) 20 MEQ tablet Take 1 tablet (20 mEq total) by mouth daily. 40MEQ x3 DAYS THEN STOP Patient taking differently: Take 40 mEq by mouth See admin instructions. Take 2 tablets for 3 days 06/27/20 09/25/20  Deberah Pelton, NP    Discontinued Meds:   Medications Discontinued During This Encounter  Medication Reason  . Sulfamethoxazole-Trimethoprim (SULFAMETHOXAZOLE-TMP DS PO) Completed Course  . aspirin EC tablet 81 mg     Social History:  reports that he quit smoking about 66 years ago. His smoking use included cigarettes. He has a 2.50 pack-year smoking history. He has never used smokeless tobacco. He reports previous alcohol use. He reports current drug use. Drug: Oxycodone.  Family History:   Family History  Problem Relation Age of Onset  . Hypertension Mother   . Hypertension Father     Pertinent items are noted in HPI.  Blood pressure 140/80, pulse 69, temperature 98.4 F (36.9 C), resp. rate 17, SpO2 98 %. General appearance: fatigued, no distress and mildly obese Head: Normocephalic, without obvious abnormality, atraumatic Resp: rhonchi bilaterally Cardio: no rub GI: soft, non-tender; bowel sounds normal; no masses,  no organomegaly Extremities: edema 1+ LLE  and s/p RAKA  Labs: Basic Metabolic Panel: Recent Labs  Lab 06/27/20 0937 06/27/20 1949 06/28/20 0757  NA 118* 117* 118*  K 5.1 4.5 4.7  CL 79* 82* 84*  CO2 21 24 22   GLUCOSE 204* 215* 199*  BUN 24 27* 26*  CREATININE 1.43* 1.39* 1.46*  CALCIUM 9.0 8.9 8.6*   Liver Function Tests: No results for input(s): AST, ALT, ALKPHOS, BILITOT, PROT, ALBUMIN in the last 168 hours. No results for input(s): LIPASE, AMYLASE in the last 168 hours. No results for input(s): AMMONIA in the last 168 hours. CBC: Recent Labs  Lab 06/27/20 0937  06/27/20 1949 06/28/20 0757  WBC 10.0 11.0* 12.0*  HGB 11.6* 11.3* 10.9*  HCT 33.5* 33.8*  32.8*  MCV 85 85.4 86.5  PLT 234 254 260   PT/INR: @labrcntip (inr:5) Cardiac Enzymes: No results for input(s): CKTOTAL, CKMB, CKMBINDEX, TROPONINI in the last 168 hours. CBG: Recent Labs  Lab 06/28/20 0808 06/28/20 1210  GLUCAP 206* 174*    Iron Studies: No results for input(s): IRON, TIBC, TRANSFERRIN, FERRITIN in the last 168 hours.  Xrays/Other Studies: DG Chest 1 View  Result Date: 06/27/2020 CLINICAL DATA:  Shortness of breath with left lower leg edema x2 weeks. EXAM: CHEST  1 VIEW COMPARISON:  December 31, 2019 FINDINGS: Moderate severity areas of atelectasis and/or infiltrate are seen within the bilateral lung bases, right greater than left. Small bilateral pleural effusions are also seen, right greater than left. No pneumothorax is identified. The cardiac silhouette is markedly enlarged. There is mild calcification of the aortic arch. Multilevel degenerative changes seen throughout the thoracic spine. IMPRESSION: 1. Moderate severity bibasilar atelectasis and/or infiltrate, right greater than left. 2. Small bilateral pleural effusions, right greater than left. 3. Cardiomegaly. Electronically Signed   By: Virgina Norfolk M.D.   On: 06/27/2020 20:34     Assessment/Plan: 1.  Hyponatremia- in setting of acute on chronic combined diastolic and systolic CHF.  Pt with marked volume overload on exam, BNP >2500.  Urine Na <10, Uosm 494, Sosm 261. 1. Start IV lasix 40 mg x 1 now and follow response if no significant increase in UOP increase to 80 mg IV bid 2. Follow serial sodium levels 2. Acute on chronic combined systolic and diastolic CHF- start IV lasix 40 mg bid and follow UOP and Scr.  Check daily weights and restrict sodium and fluid. 3. CKD stage IIIa- presumably due to combination of HTN, DM, and cardiorenal syndrome (UNa <10 and acute on chronic CHF).  Goal BP <130/80, goal Hgb A1c <7%,  hold K supplements for now due to acute on chronic CHF and AKI.  Avoid nephrotoxic agents.      Governor Rooks Talitha Dicarlo 06/28/2020, 12:43 PM

## 2020-06-28 NOTE — ED Notes (Signed)
Admitting MD paged regarding PRN sleep medication order

## 2020-06-28 NOTE — ED Notes (Signed)
Ordered breakfast--Ever Halberg 

## 2020-06-28 NOTE — ED Notes (Signed)
Pt heard yelling at daughter, kicking her & wanting to get out of bed. Staff tried multiple times to redirect pt, calm pt down & get pt appropriately settled back into bed to no avail. This RN paged MD Chotiner for stronger PRN sleep/anxiety medication. Will administer per order

## 2020-06-29 LAB — SODIUM
Sodium: 119 mmol/L — CL (ref 135–145)
Sodium: 120 mmol/L — ABNORMAL LOW (ref 135–145)
Sodium: 124 mmol/L — ABNORMAL LOW (ref 135–145)

## 2020-06-29 LAB — RENAL FUNCTION PANEL
Albumin: 3.6 g/dL (ref 3.5–5.0)
Anion gap: 13 (ref 5–15)
BUN: 29 mg/dL — ABNORMAL HIGH (ref 8–23)
CO2: 24 mmol/L (ref 22–32)
Calcium: 8.9 mg/dL (ref 8.9–10.3)
Chloride: 83 mmol/L — ABNORMAL LOW (ref 98–111)
Creatinine, Ser: 1.53 mg/dL — ABNORMAL HIGH (ref 0.61–1.24)
GFR calc Af Amer: 46 mL/min — ABNORMAL LOW
GFR calc non Af Amer: 39 mL/min — ABNORMAL LOW
Glucose, Bld: 199 mg/dL — ABNORMAL HIGH (ref 70–99)
Phosphorus: 4.2 mg/dL (ref 2.5–4.6)
Potassium: 4.8 mmol/L (ref 3.5–5.1)
Sodium: 120 mmol/L — ABNORMAL LOW (ref 135–145)

## 2020-06-29 LAB — BASIC METABOLIC PANEL
Anion gap: 10 (ref 5–15)
BUN: 30 mg/dL — ABNORMAL HIGH (ref 8–23)
CO2: 27 mmol/L (ref 22–32)
Calcium: 8.5 mg/dL — ABNORMAL LOW (ref 8.9–10.3)
Chloride: 86 mmol/L — ABNORMAL LOW (ref 98–111)
Creatinine, Ser: 1.49 mg/dL — ABNORMAL HIGH (ref 0.61–1.24)
GFR calc Af Amer: 47 mL/min — ABNORMAL LOW (ref >60)
GFR calc non Af Amer: 41 mL/min — ABNORMAL LOW (ref >60)
Glucose, Bld: 153 mg/dL — ABNORMAL HIGH (ref 70–99)
Potassium: 4.5 mmol/L (ref 3.5–5.1)
Sodium: 123 mmol/L — ABNORMAL LOW (ref 135–145)

## 2020-06-29 LAB — GLUCOSE, CAPILLARY
Glucose-Capillary: 216 mg/dL — ABNORMAL HIGH (ref 70–99)
Glucose-Capillary: 193 mg/dL — ABNORMAL HIGH (ref 70–99)
Glucose-Capillary: 128 mg/dL — ABNORMAL HIGH (ref 70–99)

## 2020-06-29 LAB — CBC
HCT: 35.4 % — ABNORMAL LOW (ref 39.0–52.0)
Hemoglobin: 11.3 g/dL — ABNORMAL LOW (ref 13.0–17.0)
MCH: 28.4 pg (ref 26.0–34.0)
MCHC: 31.9 g/dL (ref 30.0–36.0)
MCV: 88.9 fL (ref 80.0–100.0)
Platelets: 291 K/uL (ref 150–400)
RBC: 3.98 MIL/uL — ABNORMAL LOW (ref 4.22–5.81)
RDW: 14.6 % (ref 11.5–15.5)
WBC: 11.3 K/uL — ABNORMAL HIGH (ref 4.0–10.5)
nRBC: 0 % (ref 0.0–0.2)

## 2020-06-29 MED ORDER — LORAZEPAM 2 MG/ML IJ SOLN
0.5000 mg | Freq: Once | INTRAMUSCULAR | Status: AC
  Administered 2020-06-29: 0.5 mg via INTRAVENOUS
  Filled 2020-06-29: qty 1

## 2020-06-29 MED ORDER — FUROSEMIDE 10 MG/ML IJ SOLN
80.0000 mg | Freq: Two times a day (BID) | INTRAMUSCULAR | Status: DC
  Administered 2020-06-29 – 2020-07-01 (×4): 80 mg via INTRAVENOUS
  Filled 2020-06-29 (×4): qty 8

## 2020-06-29 NOTE — Progress Notes (Signed)
PT Cancellation Note  Patient Details Name: Henry Wiggins MRN: [id number] DOB: [birth year]   Cancelled Treatment:    Reason Eval/Treat Not Completed: Fatigue/lethargy limiting ability to participate. Attempted x2 throughout the day but pt remained too lethargic from Ativan dose this morning. PT will continue to follow and evaluate when appropriate.   Hardie Pulley, DPT   Acute Rehabilitation Department Pager #: [phone]   Otho Bellows 06/29/2020, 4:39 PM

## 2020-06-29 NOTE — Progress Notes (Signed)
I guess you saw that he was admitted for severe hyponatremia. He is improving w IV diuretics.

## 2020-06-29 NOTE — Progress Notes (Signed)
PROGRESS NOTE    Henry Wiggins  YNW:[id number] DOB: [birth year] DOA: 06/27/2020 PCP: Antony Contras, MD   Brief Narrative:  Patient is a 86 year old male with history of hypertension, peripheral artery disease, chronic combined systolic/diastolic CHF, type 2 diabetes mellitus, CKD stage III, CVA, paroxysmal A. fib who presents with abnormal sodium level.  He was seen by his cardiologist and sent for further evaluation to the emergency department.  Patient also reported swelling of the left leg for last few weeks.  He recently had increased dose of Lasix.  He was seen by urology a week ago for urinary retention and had a Foley placed.  There is also suspicion for mild dementia and patient is waiting for referral to neurology.   He is status post AKA in June of this year secondary to gangrenous complication from peripheral artery disease. On presentation he was found to have low sodium level of 117.  He appeared hypervolemic on presentation.    Assessment & Plan:   Principal Problem:   Hyponatremia Active Problems:   Hypertension   PAD (peripheral artery disease) (HCC)   S/P AKA (above knee amputation) (HCC)   Chronic combined systolic and diastolic heart failure (HCC)   Type 2 diabetes mellitus with hyperglycemia (HCC)   CKD stage 3 due to type 2 diabetes mellitus (HCC)   Hyponatremia: Most likely this is hypervolemic hyponatremia for volume overload from CHF.  Nephrology has been consulted.  Interestingly urine sodium less than 20.  Patient on Lasix at home.   Patient's previous sodium level was in the range of 130s.  Continue to monitor BMP. Started on IV lasix BNP more than 2500.  We will continue to monitor sodium level.  The last one being 124.  CKD stage IIIa: Currently kidney function stable.  Continue to monitor kidney function.  Avoid nephrotoxins  Hypertension: Currently blood pressure stable.  Continue current medications  Peripheral artery disease: Follows with vascular  surgery.  Status post right AKA.  Diabetes type 2: Continue sliding scale insulin.  Monitor CBGs. A1c of  7.6.  Chronic combined systolic/diastolic heart failure: Takes Lasix at home.  Follows with cardiology.  Echo done in 12/2019 showed ejection fraction of 30 to 65%, grade 2 diastolic dysfunction.Continue diuresis.  Monitor input/output, daily weight  Paroxysmal A. fib: Currently in normal sinus rhythm.  On anticoagulation with Eliquis.  Currently rate is controlled.  History of stroke: On Plavix  Dementia: Plan for follow up with neurology as outpatient,has confusion at baseline.  Patient became confused and agitated last night and was given Ativan.  He was sleepy this morning.  Please avoid benzodiazepines on this elderly demented patient.  Use Haldol if needed.  I will not order Seroquel because he has been sleepy throughout the day.  I have recommended to keep the room bright with the sunlight, requested daughter to be near him for at least few hours a day.  Urinary retention:He was seen by urology a week ago for urinary retention and had a Foley placed.  I will continue Foley placement for now.  Follow-up with urology as an outpatient for removal.  Deconditioning/debility: We have requested for physical therapy evaluation.  Patient lives with her daughter.  Uses wheelchair at baseline         DVT prophylaxis: Eliquis Code Status: DNR Family Communication: Called and discussed with  daughter at bedside  on 06/29/20 Status is: Inpatient  Remains inpatient appropriate because:IV treatments appropriate due to intensity of illness or inability to  take PO   Dispo: The patient is from: Home              Anticipated d/c is to Home              Anticipated d/c date is: 2 days              Patient currently is not medically stable to d/c.    Consultants: Nephrology  Procedures: None  Antimicrobials:  Anti-infectives (From admission, onward)   None      Subjective: Patient  seen and examined at the bedside this morning.  Sleepy, drowsy.  Not in distress.  Daughter was at the bedside.  Objective: Vitals:   06/28/20 0654 06/28/20 1344 06/28/20 1400 06/28/20 2037  BP: 140/80 139/83 (!) 145/92 129/63  Pulse: 69  87 84  Resp: 17 17  18   Temp: 98.4 F (36.9 C)   98.9 F (37.2 C)  TempSrc:    Oral  SpO2: 98% 90% 96% 95%    Intake/Output Summary (Last 24 hours) at 06/29/2020 0848 Last data filed at 06/28/2020 2040 Gross per 24 hour  Intake 40 ml  Output 500 ml  Net -460 ml   There were no vitals filed for this visit.  Examination:  General exam:sleepy,lying on bed  Respiratory system: Bilateral equal air entry, normal vesicular breath sounds, no wheezes or crackles today Cardiovascular system: S1 & S2 heard, RRR. No JVD, murmurs, rubs, gallops or clicks. Gastrointestinal system: Abdomen is nondistended, soft and nontender. No organomegaly or masses felt. Normal bowel sounds heard. Central nervous system: Not alert or awake Extremities: Trace edema on left lower extremity, no clubbing ,no cyanosis, right AKA  skin: No rashes, lesions or ulcers,no icterus ,no pallor    Data Reviewed: I have personally reviewed following labs and imaging studies  CBC: Recent Labs  Lab 06/27/20 0937 06/27/20 1949 06/28/20 0757 06/29/20 0653  WBC 10.0 11.0* 12.0* 11.3*  HGB 11.6* 11.3* 10.9* 11.3*  HCT 33.5* 33.8* 32.8* 35.4*  MCV 85 85.4 86.5 88.9  PLT 234 254 260 287   Basic Metabolic Panel: Recent Labs  Lab 06/27/20 0937 06/27/20 1949 06/27/20 1949 06/28/20 0757 06/28/20 1506 06/28/20 1924 06/29/20 0056 06/29/20 0653  NA 118* 117*   < > 118* 121* 119* 119* 120*  120*  K 5.1 4.5  --  4.7  --   --   --  4.8  CL 79* 82*  --  84*  --   --   --  83*  CO2 21 24  --  22  --   --   --  24  GLUCOSE 204* 215*  --  199*  --   --   --  199*  BUN 24 27*  --  26*  --   --   --  29*  CREATININE 1.43* 1.39*  --  1.46*  --   --   --  1.53*  CALCIUM 9.0 8.9  --   8.6*  --   --   --  8.9  PHOS  --   --   --   --   --   --   --  4.2   < > = values in this interval not displayed.   GFR: CrCl cannot be calculated (Unknown ideal weight.). Liver Function Tests: Recent Labs  Lab 06/29/20 0653  ALBUMIN 3.6   No results for input(s): LIPASE, AMYLASE in the last 168 hours. No results for input(s): AMMONIA in the  last 168 hours. Coagulation Profile: No results for input(s): INR, PROTIME in the last 168 hours. Cardiac Enzymes: No results for input(s): CKTOTAL, CKMB, CKMBINDEX, TROPONINI in the last 168 hours. BNP (last 3 results) No results for input(s): PROBNP in the last 8760 hours. HbA1C: Recent Labs    06/28/20 0757  HGBA1C 7.6*   CBG: Recent Labs  Lab 06/28/20 1256 06/28/20 1500 06/28/20 1928 06/28/20 2037 06/29/20 0741  GLUCAP 166* 129* 152* 167* 216*   Lipid Profile: No results for input(s): CHOL, HDL, LDLCALC, TRIG, CHOLHDL, LDLDIRECT in the last 72 hours. Thyroid Function Tests: Recent Labs    06/28/20 0757  TSH 4.641*   Anemia Panel: No results for input(s): VITAMINB12, FOLATE, FERRITIN, TIBC, IRON, RETICCTPCT in the last 72 hours. Sepsis Labs: No results for input(s): PROCALCITON, LATICACIDVEN in the last 168 hours.  Recent Results (from the past 240 hour(s))  SARS Coronavirus 2 by RT PCR (hospital order, performed in [facility] hospital lab) Nasopharyngeal Nasopharyngeal Swab     Status: None   Collection Time: 06/27/20 11:41 PM   Specimen: Nasopharyngeal Swab  Result Value Ref Range Status   SARS Coronavirus 2 NEGATIVE NEGATIVE Final    Comment: (NOTE) SARS-CoV-2 target nucleic acids are NOT DETECTED.  The SARS-CoV-2 RNA is generally detectable in upper and lower respiratory specimens during the acute phase of infection. The lowest concentration of SARS-CoV-2 viral copies this assay can detect is 250 copies / mL. A negative result does not preclude SARS-CoV-2 infection and should not be used as the sole basis  for treatment or other patient management decisions.  A negative result may occur with improper specimen collection / handling, submission of specimen other than nasopharyngeal swab, presence of viral mutation(s) within the areas targeted by this assay, and inadequate number of viral copies (<250 copies / mL). A negative result must be combined with clinical observations, patient history, and epidemiological information.  Fact Sheet for Patients:   StrictlyIdeas.no  Fact Sheet for Healthcare Providers: BankingDealers.co.za  This test is not yet approved or  cleared by the [place] FDA and has been authorized for detection and/or diagnosis of SARS-CoV-2 by FDA under an Emergency Use Authorization (EUA).  This EUA will remain in effect (meaning this test can be used) for the duration of the COVID-19 declaration under Section 564(b)(1) of the Act, 21 U.S.C. section 360bbb-3(b)(1), unless the authorization is terminated or revoked sooner.  Performed at [facility], [place] [address] [place], [place] [address]          Radiology Studies: DG Chest 1 View  Result Date: 06/27/2020 CLINICAL DATA:  Shortness of breath with left lower leg edema x2 weeks. EXAM: CHEST  1 VIEW COMPARISON:  December 31, 2019 FINDINGS: Moderate severity areas of atelectasis and/or infiltrate are seen within the bilateral lung bases, right greater than left. Small bilateral pleural effusions are also seen, right greater than left. No pneumothorax is identified. The cardiac silhouette is markedly enlarged. There is mild calcification of the aortic arch. Multilevel degenerative changes seen throughout the thoracic spine. IMPRESSION: 1. Moderate severity bibasilar atelectasis and/or infiltrate, right greater than left. 2. Small bilateral pleural effusions, right greater than left. 3. Cardiomegaly. Electronically Signed   By: Virgina Norfolk M.D.   On: 06/27/2020  20:34        Scheduled Meds: . apixaban  2.5 mg Oral BID  . Chlorhexidine Gluconate Cloth  6 each Topical Daily  . clopidogrel  75 mg Oral Daily  .  furosemide  40 mg Intravenous BID  . gabapentin  100 mg Oral BID  . insulin aspart  0-15 Units Subcutaneous TID WC  . sodium chloride flush  3 mL Intravenous Once   Continuous Infusions:   LOS: 1 day    Time spent: 35 mins,More than 50% of that time was spent in counseling and/or coordination of care.      Shelly Coss, MD [facility] P7/08/2020, 8:48 AM

## 2020-06-29 NOTE — Discharge Instructions (Signed)

## 2020-06-29 NOTE — Progress Notes (Signed)
Patient ID: Henry Wiggins, male   DOB: May 05, 1927, 86 y.o.   MRN: [id number] S: Very somnolent this am after ativan dose.  Improved UOP with IV lasix but had some hematuria after pulling on his foley. O:BP 129/63 (BP Location: Left Arm)   Pulse 84   Temp 98.9 F (37.2 C) (Oral)   Resp 18   SpO2 95%   Intake/Output Summary (Last 24 hours) at 06/29/2020 1026 Last data filed at 06/29/2020 1006 Gross per 24 hour  Intake 40 ml  Output 850 ml  Net -810 ml   Intake/Output: I/O last 3 completed shifts: In: 64 [P.O.:40] Out: 500 [Urine:500]  Intake/Output this shift:  Total I/O In: -  Out: 350 [Urine:350] Weight change:  Gen: somnolent, NAD CVS: no rub Resp: decreased BS at bases Abd: benign Ext: trace edema of left leg, s/p RAKA  Recent Labs  Lab 06/27/20 0937 06/27/20 1949 06/28/20 0757 06/28/20 1506 06/28/20 1924 06/29/20 0056 06/29/20 0653  NA 118* 117* 118* 121* 119* 119* 120*  120*  K 5.1 4.5 4.7  --   --   --  4.8  CL 79* 82* 84*  --   --   --  83*  CO2 21 24 22   --   --   --  24  GLUCOSE 204* 215* 199*  --   --   --  199*  BUN 24 27* 26*  --   --   --  29*  CREATININE 1.43* 1.39* 1.46*  --   --   --  1.53*  ALBUMIN  --   --   --   --   --   --  3.6  CALCIUM 9.0 8.9 8.6*  --   --   --  8.9  PHOS  --   --   --   --   --   --  4.2   Liver Function Tests: Recent Labs  Lab 06/29/20 0653  ALBUMIN 3.6   No results for input(s): LIPASE, AMYLASE in the last 168 hours. No results for input(s): AMMONIA in the last 168 hours. CBC: Recent Labs  Lab 06/27/20 0937 06/27/20 1949 06/28/20 0757 06/29/20 0653  WBC 10.0 11.0* 12.0* 11.3*  HGB 11.6* 11.3* 10.9* 11.3*  HCT 33.5* 33.8* 32.8* 35.4*  MCV 85 85.4 86.5 88.9  PLT 234 254 260 291   Cardiac Enzymes: No results for input(s): CKTOTAL, CKMB, CKMBINDEX, TROPONINI in the last 168 hours. CBG: Recent Labs  Lab 06/28/20 1256 06/28/20 1500 06/28/20 1928 06/28/20 2037 06/29/20 0741  GLUCAP 166* 129* 152* 167*  216*    Iron Studies: No results for input(s): IRON, TIBC, TRANSFERRIN, FERRITIN in the last 72 hours. Studies/Results: No results found. Marland Kitchen apixaban  2.5 mg Oral BID  . Chlorhexidine Gluconate Cloth  6 each Topical Daily  . clopidogrel  75 mg Oral Daily  . furosemide  40 mg Intravenous BID  . gabapentin  100 mg Oral BID  . insulin aspart  0-15 Units Subcutaneous TID WC  . sodium chloride flush  3 mL Intravenous Once    BMET    Component Value Date/Time   NA 120 (L) 06/29/2020 0653   NA 120 (L) 06/29/2020 0653   NA 118 (LL) 06/27/2020 0937   K 4.8 06/29/2020 0653   CL 83 (L) 06/29/2020 0653   CO2 24 06/29/2020 0653   GLUCOSE 199 (H) 06/29/2020 0653   BUN 29 (H) 06/29/2020 0653   BUN 24 06/27/2020 4010  CREATININE 1.53 (H) 06/29/2020 0653   CALCIUM 8.9 06/29/2020 0653   GFRNONAA 39 (L) 06/29/2020 0653   GFRAA 46 (L) 06/29/2020 0653   CBC    Component Value Date/Time   WBC 11.3 (H) 06/29/2020 0653   RBC 3.98 (L) 06/29/2020 0653   HGB 11.3 (L) 06/29/2020 0653   HGB 11.6 (L) 06/27/2020 0937   HCT 35.4 (L) 06/29/2020 0653   HCT 33.5 (L) 06/27/2020 0937   PLT 291 06/29/2020 0653   PLT 234 06/27/2020 0937   MCV 88.9 06/29/2020 0653   MCV 85 06/27/2020 0937   MCH 28.4 06/29/2020 0653   MCHC 31.9 06/29/2020 0653   RDW 14.6 06/29/2020 0653   RDW 14.0 06/27/2020 0937   LYMPHSABS 1.7 05/26/2015 2244   MONOABS 0.8 05/26/2015 2244   EOSABS 0.3 05/26/2015 2244   BASOSABS 0.0 05/26/2015 2244     Assessment/Plan: 1.  Hyponatremia- in setting of acute on chronic combined diastolic and systolic CHF.  Pt with marked volume overload on exam, BNP >2500.  Urine Na <10, Uosm 494, Sosm 261. 1. Started IV lasix 40 mg bid with some improvement of UOP and edema. 2. Slight improvement in sodium from 117 to 120. 3. Will follow UOP throughout the day and if not improving will increase to 80 mg IV bid 4. Follow serial sodium levels 2. Acute on chronic combined systolic and diastolic  CHF- start IV lasix 40 mg bid and follow UOP and Scr.  Check daily weights and restrict sodium and fluid. 3. CKD stage IIIa- presumably due to combination of HTN, DM, and cardiorenal syndrome (UNa <10 and acute on chronic CHF).  Goal BP <130/80, goal Hgb A1c <7%, hold K supplements for now due to acute on chronic CHF and AKI.  Avoid nephrotoxic agents.     Donetta Potts, MD [employer] [phone]

## 2020-06-30 DIAGNOSIS — Z66 Do not resuscitate: Secondary | ICD-10-CM

## 2020-06-30 DIAGNOSIS — Z7189 Other specified counseling: Secondary | ICD-10-CM

## 2020-06-30 DIAGNOSIS — Z89611 Acquired absence of right leg above knee: Secondary | ICD-10-CM

## 2020-06-30 DIAGNOSIS — N183 Chronic kidney disease, stage 3 unspecified: Secondary | ICD-10-CM

## 2020-06-30 DIAGNOSIS — I10 Essential (primary) hypertension: Secondary | ICD-10-CM

## 2020-06-30 DIAGNOSIS — Z515 Encounter for palliative care: Secondary | ICD-10-CM

## 2020-06-30 DIAGNOSIS — E1122 Type 2 diabetes mellitus with diabetic chronic kidney disease: Secondary | ICD-10-CM

## 2020-06-30 DIAGNOSIS — I5042 Chronic combined systolic (congestive) and diastolic (congestive) heart failure: Secondary | ICD-10-CM

## 2020-06-30 DIAGNOSIS — I5023 Acute on chronic systolic (congestive) heart failure: Secondary | ICD-10-CM

## 2020-06-30 LAB — SODIUM
Sodium: 125 mmol/L — ABNORMAL LOW (ref 135–145)
Sodium: 123 mmol/L — ABNORMAL LOW (ref 135–145)
Sodium: 124 mmol/L — ABNORMAL LOW (ref 135–145)
Sodium: 125 mmol/L — ABNORMAL LOW (ref 135–145)
Sodium: 126 mmol/L — ABNORMAL LOW (ref 135–145)
Sodium: 126 mmol/L — ABNORMAL LOW (ref 135–145)

## 2020-06-30 LAB — GLUCOSE, CAPILLARY
Glucose-Capillary: 124 mg/dL — ABNORMAL HIGH (ref 70–99)
Glucose-Capillary: 219 mg/dL — ABNORMAL HIGH (ref 70–99)
Glucose-Capillary: 201 mg/dL — ABNORMAL HIGH (ref 70–99)

## 2020-06-30 MED ORDER — TAMSULOSIN HCL 0.4 MG PO CAPS
0.4000 mg | ORAL_CAPSULE | Freq: Every day | ORAL | Status: DC
  Administered 2020-06-30: 0.4 mg via ORAL
  Filled 2020-06-30: qty 1

## 2020-06-30 NOTE — Progress Notes (Signed)
Patient ID: Henry Wiggins, male   DOB: March 21, 1927, 86 y.o.   MRN: [id number] S: more awake and alert this am O:BP (!) 148/80 (BP Location: Right Arm)   Pulse 86   Temp 98.2 F (36.8 C) (Axillary)   Resp 16   Ht 5\' 9"  (1.753 m)   Wt 89.3 kg   SpO2 90%   BMI 29.07 kg/m   Intake/Output Summary (Last 24 hours) at 06/30/2020 1019 Last data filed at 06/29/2020 2344 Gross per 24 hour  Intake --  Output 1550 ml  Net -1550 ml   Intake/Output: I/O last 3 completed shifts: In: -  Out: 2400 [Urine:2400]  Intake/Output this shift:  No intake/output data recorded. Weight change:  Gen:NAD CVS:no rub Resp:scattered rhonchi Abd: +BS, soft, NT/ND Ext: s/p RAKA, trace edema of left lower ext  Recent Labs  Lab 06/27/20 0937 06/27/20 1949 06/27/20 1949 06/28/20 0757 06/28/20 1506 06/29/20 0056 06/29/20 0653 06/29/20 1149 06/29/20 1506 06/30/20 0050 06/30/20 0654 06/30/20 0846  NA 118* 117*   < > 118*   < > 119* 120*  120* 124* 123* 125* 123* 124*  K 5.1 4.5  --  4.7  --   --  4.8  --  4.5  --   --   --   CL 79* 82*  --  84*  --   --  83*  --  86*  --   --   --   CO2 21 24  --  22  --   --  24  --  27  --   --   --   GLUCOSE 204* 215*  --  199*  --   --  199*  --  153*  --   --   --   BUN 24 27*  --  26*  --   --  29*  --  30*  --   --   --   CREATININE 1.43* 1.39*  --  1.46*  --   --  1.53*  --  1.49*  --   --   --   ALBUMIN  --   --   --   --   --   --  3.6  --   --   --   --   --   CALCIUM 9.0 8.9  --  8.6*  --   --  8.9  --  8.5*  --   --   --   PHOS  --   --   --   --   --   --  4.2  --   --   --   --   --    < > = values in this interval not displayed.   Liver Function Tests: Recent Labs  Lab 06/29/20 0653  ALBUMIN 3.6   No results for input(s): LIPASE, AMYLASE in the last 168 hours. No results for input(s): AMMONIA in the last 168 hours. CBC: Recent Labs  Lab 06/27/20 0937 06/27/20 1949 06/28/20 0757 06/29/20 0653  WBC 10.0 11.0* 12.0* 11.3*  HGB 11.6* 11.3*  10.9* 11.3*  HCT 33.5* 33.8* 32.8* 35.4*  MCV 85 85.4 86.5 88.9  PLT 234 254 260 291   Cardiac Enzymes: No results for input(s): CKTOTAL, CKMB, CKMBINDEX, TROPONINI in the last 168 hours. CBG: Recent Labs  Lab 06/28/20 2037 06/29/20 0741 06/29/20 1118 06/29/20 1806 06/30/20 0817  GLUCAP 167* 216* 193* 128* 124*    Iron Studies: No results for  input(s): IRON, TIBC, TRANSFERRIN, FERRITIN in the last 72 hours. Studies/Results: No results found. Marland Kitchen apixaban  2.5 mg Oral BID  . Chlorhexidine Gluconate Cloth  6 each Topical Daily  . clopidogrel  75 mg Oral Daily  . furosemide  80 mg Intravenous BID  . gabapentin  100 mg Oral BID  . insulin aspart  0-15 Units Subcutaneous TID WC  . sodium chloride flush  3 mL Intravenous Once    BMET    Component Value Date/Time   NA 124 (L) 06/30/2020 0846   NA 118 (LL) 06/27/2020 0937   K 4.5 06/29/2020 1506   CL 86 (L) 06/29/2020 1506   CO2 27 06/29/2020 1506   GLUCOSE 153 (H) 06/29/2020 1506   BUN 30 (H) 06/29/2020 1506   BUN 24 06/27/2020 0937   CREATININE 1.49 (H) 06/29/2020 1506   CALCIUM 8.5 (L) 06/29/2020 1506   GFRNONAA 41 (L) 06/29/2020 1506   GFRAA 47 (L) 06/29/2020 1506   CBC    Component Value Date/Time   WBC 11.3 (H) 06/29/2020 0653   RBC 3.98 (L) 06/29/2020 0653   HGB 11.3 (L) 06/29/2020 0653   HGB 11.6 (L) 06/27/2020 0937   HCT 35.4 (L) 06/29/2020 0653   HCT 33.5 (L) 06/27/2020 0937   PLT 291 06/29/2020 0653   PLT 234 06/27/2020 0937   MCV 88.9 06/29/2020 0653   MCV 85 06/27/2020 0937   MCH 28.4 06/29/2020 0653   MCHC 31.9 06/29/2020 0653   RDW 14.6 06/29/2020 0653   RDW 14.0 06/27/2020 0937   LYMPHSABS 1.7 05/26/2015 2244   MONOABS 0.8 05/26/2015 2244   EOSABS 0.3 05/26/2015 2244   BASOSABS 0.0 05/26/2015 2244     Assessment/Plan: 1. Hyponatremia- in setting of acute on chronic combined diastolic and systolic CHF. Pt with marked volume overload on exam, BNP >2500. Urine Na <10, Uosm 494, Sosm  261. 1. Started IV lasix 40 mg bid with some improvement of serum sodium, UOP and edema. 2. Continued improvement in sodium from 117 to 120 to 124 today. 3. Will follow UOP throughout the day and if not improving will increase to 80 mg IV bid 4. Follow serial sodium levels 2. Acute on chronic combined systolic and diastolic CHF- start IV lasix 40 mg bid and follow UOP and Scr. Check daily weights and restrict sodium and fluid. 3. CKD stage IIIa- presumably due to combination of HTN, DM, and cardiorenal syndrome (UNa <10 and acute on chronic CHF). Goal BP <130/80, goal Hgb A1c <7%, hold K supplements for now due to acute on chronic CHF and AKI. Avoid nephrotoxic agents.   Donetta Potts, MD [employer] [phone]

## 2020-06-30 NOTE — Consult Note (Signed)
Consultation Note Date: 06/30/2020   Patient Name: Henry Wiggins  DOB: 06-Jan-1927  MRN: [id number]  Age / Sex: 86 y.o., male   PCP: Antony Contras, MD Referring Physician: Darliss Cheney, MD   REASON FOR CONSULTATION:Establishing goals of care  Palliative Care consult requested for goals of care discussion in this 86 y.o. male with multiple medical problems including hypertension, peripheral artery disease, chronic systolic and diastolic CHF (EF 77-41%), type 2 diabetes mellitus, CKD stage III, history of CVA, atrial fibrillation (Eliquis) right AKA (12/2019). He presented to the ED from his Cardiologist office due to abnormal sodium levels. Per notation patient was experiencing an increase in leg swelling and lasix was increased a week prior. He was seen by Urology outpatient with concerns for urinary retention and foley catheter was placed. During work-up sodium 117. BNP 2540.  Patient followed by Nephrology since admission.   Clinical Assessment and Goals of Care: I have reviewed medical records including lab results, imaging, Epic notes, and MAR, received report from the bedside RN, and assessed the patient. I met at the bedside with patient's daughter and son to discuss diagnosis prognosis, GOC, EOL wishes, disposition and options.  Mr. Henry Wiggins is somewhat somnolent but easily aroused on verbal stimuli. He is alert and oriented to self and family. Denies pain. Will answer questions however falls back to sleep.   I introduced Palliative Medicine as specialized medical care for people living with serious illness. It focuses on providing relief from the symptoms and stress of a serious illness. The goal is to improve quality of life for both the patient and the family. Family verbalizes understanding and appreciation of our involvement.   We discussed a brief life review of the patient, along with his functional and nutritional status. 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  He has 2 children.  His wife of more than 14 years passed away over a year ago.  He enjoyed working on boats and with his hands.  Daughter lives in the home with patient.  Family reports over the past month patient has continued to show signs of decline.  Family endorses continued depression since the loss of his wife.  He does engage in physical therapy in the home.  He generally is sitting up throughout the day and utilizes a wheelchair due to right AKA.  Family uses a chair lift to get patient up and down stairs.  Family reports increase in agitated behavior, arguing, and decrease in appetite.  Son shares prior to admission patient was extremely weak and was difficult getting out of bed.  He generally is able to somewhat assist with standing turning and pivoting on his 1 lower extremity.  Patient does not sleep well during the night and will often be up majority of the time.  His son assist with a shower and bedtime preparations  We discussed His current illness and what it means in the larger context of His on-going co-morbidities. Natural disease trajectory and expectations at EOL were discussed.  Family verbalized understanding of patient's current illness and ongoing comorbidities.  Prior to admission patient was scheduled to be seen by neurology for work-up due to dementia like behaviors.  I attempted to elicit values and goals of care important to the patient.    Family verbalizes has expressed his will to not live or engage in normal activities over the past months-years.  They express awareness patient desperately misses his wife/best friend.  Both son and daughter mutually agrees they would not want to force patient into any care that he would not want.  They shared previous experiences with her mother during her end-of-life decision-making.  The difference between aggressive medical intervention and comfort care was considered in light of the patient's goals of care. I  educated patient/family on what comfort care measures would look like.   Family expresses wishes to continue to treat the treatable while hospitalized with a goal of patient returning home with family.  They are not interested in facility placement.  Advanced directives, concepts specific to code status, artifical feeding and hydration, and rehospitalization were considered and discussed. Patient does have a documented advanced directive.  Children both verbalized they are equally patient's decision makers and are on the same page regarding goals of care/decisions.  Family confirms DNR/DNI, no artificial feeding such as PEG, no aggressive medical interventions.   Hospice and Palliative Care services outpatient were explained and offered.  Family verbalized their understanding and awareness of both palliative and hospice's goals and philosophy of care.  Given family's expressed goals to focus on comfort with awareness of patient's poor appetite, comorbidities, and functional state recommendations for outpatient hospice provided.  Daughter tearful and expressing her awareness of her father's decline and limited will to live.  Brother providing support acknowledging his agreement.  Family is requesting outpatient hospice support at discharge.  Education provided on referral process.  Questions and concerns were addressed.  Hard Choices booklet left for review. The family was encouraged to call with questions or concerns.  PMT will continue to support holistically.   SOCIAL HISTORY:     reports that he quit smoking about 66 years ago. His smoking use included cigarettes. He has a 2.50 pack-year smoking history. He has never used smokeless tobacco. He reports previous alcohol use. He reports current drug use. Drug: Oxycodone.  CODE STATUS: DNR  ADVANCE DIRECTIVES: Children  SYMPTOM MANAGEMENT: Per attending  Palliative Prophylaxis:   Aspiration, Bowel Regimen, Delirium Protocol, Frequent Pain  Assessment, Oral Care and Turn Reposition  PSYCHO-SOCIAL/SPIRITUAL:  Support System: Family  Desire for further Chaplaincy support: No  Additional Recommendations (Limitations, Scope, Preferences):  Avoid Hospitalization, Minimize Medications, No Artificial Feeding and Continue to treat the treatable with a goal of discharging home with outpatient hospice support for comfort.  Educations on hospice/palliative    PAST MEDICAL HISTORY: Past Medical History:  Diagnosis Date   Anxiety    Arthritis    Bladder cancer ([place])    Dr. Janice Norrie   CHF (congestive heart failure) ([place])    Chronic kidney disease    Depression    Diabetes ([place])    type 2   Family history of adverse reaction to anesthesia    Daughter stated that she is difficult to wake up.slow to wake up   Full dentures    GERD (gastroesophageal reflux disease)    HOH (hard of hearing)    Hyperlipidemia    Hypertension    Memory loss    Peripheral vascular disease ([place])    Stroke ([place])    UTI (lower urinary tract infection)     ALLERGIES:  has No Known Allergies.   MEDICATIONS:  Current Facility-Administered Medications  Medication Dose Route Frequency Provider Last Rate Last Admin   acetaminophen (TYLENOL) tablet 650 mg  650 mg Oral Q6H PRN Chotiner, Yevonne Aline, MD       Or   acetaminophen (TYLENOL) suppository 650 mg  650 mg Rectal Q6H PRN Chotiner,  Yevonne Aline, MD       apixaban Arne Cleveland) tablet 2.5 mg  2.5 mg Oral BID Shelly Coss, MD   2.5 mg at 06/30/20 6384   Chlorhexidine Gluconate Cloth 2 % PADS 6 each  6 each Topical Daily Shelly Coss, MD   6 each at 06/30/20 6659   clopidogrel (PLAVIX) tablet 75 mg  75 mg Oral Daily Shelly Coss, MD   75 mg at 06/30/20 0824   furosemide (LASIX) injection 80 mg  80 mg Intravenous BID Donato Heinz, MD   80 mg at 06/30/20 1724   gabapentin (NEURONTIN) capsule 100 mg  100 mg Oral BID Shelly Coss, MD   100 mg at 06/30/20 0823   insulin  aspart (novoLOG) injection 0-15 Units  0-15 Units Subcutaneous TID WC Chotiner, Yevonne Aline, MD   3 Units at 06/30/20 1725   sodium chloride flush (NS) 0.9 % injection 3 mL  3 mL Intravenous Once Pattricia Boss, MD       tamsulosin [facility]) capsule 0.4 mg  0.4 mg Oral QPC supper Darliss Cheney, MD   0.4 mg at 06/30/20 1725    VITAL SIGNS: BP 99/66 (BP Location: Left Arm)    Pulse 89    Temp 98 F (36.7 C) (Oral)    Resp 16    Ht _0  (1.753 m)    Wt 89.3 kg    SpO2 (!) 88%    BMI 29.07 kg/m  Filed Weights   06/29/20 0800  Weight: 89.3 kg    Estimated body mass index is 29.07 kg/m as calculated from the following:   Height as of this encounter: _1  (1.753 m).   Weight as of this encounter: 89.3 kg.  LABS: CBC:    Component Value Date/Time   WBC 11.3 (H) 06/29/2020 0653   HGB 11.3 (L) 06/29/2020 0653   HGB 11.6 (L) 06/27/2020 0937   HCT 35.4 (L) 06/29/2020 0653   HCT 33.5 (L) 06/27/2020 0937   PLT 291 06/29/2020 0653   PLT 234 06/27/2020 0937   Comprehensive Metabolic Panel:    Component Value Date/Time   NA 126 (L) 06/30/2020 1603   NA 118 (LL) 06/27/2020 0937   K 4.5 06/29/2020 1506   BUN 30 (H) 06/29/2020 1506   BUN 24 06/27/2020 0937   CREATININE 1.49 (H) 06/29/2020 1506   ALBUMIN 3.6 06/29/2020 0653     Review of Systems Unless otherwise noted, a complete review of systems is negative.  Physical Exam General: NAD, somnolent Cardiovascular: regular rate and rhythm Pulmonary: clear ant fields, diminished bilaterally  Neurological: Somnolent, alert and oriented to self and family   Prognosis: < 6 months  Discharge Planning:  Home with Hospice  Recommendations:  DNR-as confirmed by   Continue with current plan of care per medical team.  No aggressive medical interventions/no escalation in care  Family requesting to continue treating the treatable while hospitalized with a goal of discharging home with outpatient hospice for comfort once medically  optimized.  Outpatient hospice (referral placed)  PMT will continue to support and follow. Please call team line with urgent needs.   Palliative Performance Scale: PPS 20-30%              Family expressed understanding and was in agreement with this plan.   Thank you for allowing the Palliative Medicine Team to assist in the care of this patient.  Time In: 1530 Time Out: 1625 Time Total: 55 minutes  Visit consisted of counseling and education  dealing with the complex and emotionally intense issues of symptom management and palliative care in the setting of serious and potentially life-threatening illness.Greater than 50%  of this time was spent counseling and coordinating care related to the above assessment and plan.  Signed by:  Alda Lea, AGPCNP-BC Palliative Medicine Team  Phone: [phone] Pager: [phone] Amion: Bjorn Pippin

## 2020-06-30 NOTE — Progress Notes (Signed)
PROGRESS NOTE    Henry Wiggins  MQK:[id number] DOB: [birth year] DOA: 06/27/2020 PCP: Antony Contras, MD   Brief Narrative:  Patient is a 86 year old male with history of hypertension, peripheral artery disease, chronic combined systolic/diastolic CHF, type 2 diabetes mellitus, CKD stage III, CVA, paroxysmal A. fib who presents with abnormal sodium level.  He was seen by his cardiologist and sent for further evaluation to the emergency department.  Patient also reported swelling of the left leg for last few weeks.  He recently had increased dose of Lasix.  He was seen by urology a week ago for urinary retention and had a Foley placed.  There is also suspicion for mild dementia and patient is waiting for referral to neurology.   He is status post AKA in June of this year secondary to gangrenous complication from peripheral artery disease. On presentation he was found to have low sodium level of 117.  He appeared hypervolemic on presentation.    Assessment & Plan:   Principal Problem:   Hyponatremia Active Problems:   Hypertension   PAD (peripheral artery disease) (HCC)   S/P AKA (above knee amputation) (HCC)   Chronic combined systolic and diastolic heart failure (HCC)   Type 2 diabetes mellitus with hyperglycemia (HCC)   CKD stage 3 due to type 2 diabetes mellitus (HCC)   Hypervolemic hyponatremia: Most likely this is hypervolemic hyponatremia for volume overload from CHF.  Nephrology has been consulted. Patient on Lasix at home.   Patient's previous sodium level was in the range of 130s.  Sodium was 117 upon presentation and currently 125 so it is improving.  He remains on IV Lasix.  CKD stage IIIa: Currently kidney function stable.  Continue to monitor kidney function.  Avoid nephrotoxins  Hypertension: Currently blood pressure stable.  Continue current medications  Peripheral artery disease: Follows with vascular surgery.  Status post right AKA.  Diabetes mellitus type II: Blood  sugar mostly controlled with some brief spikes.  Continue sliding scale insulin.  Monitor CBGs. A1c of  7.6.  Chronic combined systolic/diastolic heart failure: Takes Lasix at home.  Follows with cardiology.  Echo done in 12/2019 showed ejection fraction of 30 to 65%, grade 2 diastolic dysfunction.Continue diuresis.  Monitor input/output, daily weight  Paroxysmal A. fib: Currently in normal sinus rhythm.  On anticoagulation with Eliquis.  Currently rate is controlled.  History of stroke: On Plavix  Dementia/hospital acquired delirium: Patient was having some signs of dementia at home.  He has plans to follow-up with neurology for formal evaluation however patient developed delirium on 06/29/2020 after he was given Ativan.  Currently although he is slightly groggy but he is easily arousable.  He was oriented x2.  Daughter at the bedside.  According to daughter, he is much better than yesterday and very close to his baseline.  Continue delirium precautions, keep the light brighter during the daytime and darker and restful at night.  Avoid unnecessary interruptions.  Urinary retention:He was seen by urology a week ago for urinary retention and had a Foley placed.  I will continue Foley placement for now.  Follow-up with urology as an outpatient for removal.  He was started on Rapaflo.  It is nonformulary.  We will switch to Flomax here.  Deconditioning/debility: We have requested for physical therapy evaluation.  Patient lives with her daughter.  Uses wheelchair at baseline         DVT prophylaxis: Eliquis Code Status: DNR Family Communication: Discussed with daughter at the bedside. Status  is: Inpatient  Remains inpatient appropriate because:IV treatments appropriate due to intensity of illness or inability to take PO   Dispo: The patient is from: Home              Anticipated d/c is to Home              Anticipated d/c date is: 1 to 2 days              Patient currently is not medically  stable to d/c.    Consultants: Nephrology  Procedures: None  Antimicrobials:  Anti-infectives (From admission, onward)   None      Subjective: Patient seen and examined.  Daughter at the bedside.  Little groggy but easily arousable but falls asleep back after answering briefly.  No complaints.  Looks comfortable.  Objective: Vitals:   06/29/20 1935 06/29/20 2329 06/30/20 0400 06/30/20 0742  BP: 134/88 (!) 143/85 (!) 148/80   Pulse: 73 80 86   Resp: 14 16 16    Temp: 97.7 F (36.5 C) 97.9 F (36.6 C) 98 F (36.7 C) 98.2 F (36.8 C)  TempSrc: Axillary Axillary Axillary Axillary  SpO2: 94% 98% 90%   Weight:      Height:        Intake/Output Summary (Last 24 hours) at 06/30/2020 1316 Last data filed at 06/30/2020 1100 Gross per 24 hour  Intake --  Output 2750 ml  Net -2750 ml   Filed Weights   06/29/20 0800  Weight: 89.3 kg    Examination:  General exam: Appears calm and comfortable but groggy Respiratory system: Clear to auscultation. Respiratory effort normal. Cardiovascular system: S1 & S2 heard, RRR. No JVD, murmurs, rubs, gallops or clicks. No pedal edema. Gastrointestinal system: Abdomen is nondistended, soft and nontender. No organomegaly or masses felt. Normal bowel sounds heard. Central nervous system: Sleepy and partially oriented.  Data Reviewed: I have personally reviewed following labs and imaging studies  CBC: Recent Labs  Lab 06/27/20 0937 06/27/20 1949 06/28/20 0757 06/29/20 0653  WBC 10.0 11.0* 12.0* 11.3*  HGB 11.6* 11.3* 10.9* 11.3*  HCT 33.5* 33.8* 32.8* 35.4*  MCV 85 85.4 86.5 88.9  PLT 234 254 260 024   Basic Metabolic Panel: Recent Labs  Lab 06/27/20 0937 06/27/20 1949 06/27/20 1949 06/28/20 0757 06/28/20 1506 06/29/20 0653 06/29/20 0653 06/29/20 1149 06/29/20 1506 06/30/20 0050 06/30/20 0654 06/30/20 0846  NA 118* 117*   < > 118*   < > 120*  120*   < > 124* 123* 125* 123* 124*  K 5.1 4.5  --  4.7  --  4.8  --   --   4.5  --   --   --   CL 79* 82*  --  84*  --  83*  --   --  86*  --   --   --   CO2 21 24  --  22  --  24  --   --  27  --   --   --   GLUCOSE 204* 215*  --  199*  --  199*  --   --  153*  --   --   --   BUN 24 27*  --  26*  --  29*  --   --  30*  --   --   --   CREATININE 1.43* 1.39*  --  1.46*  --  1.53*  --   --  1.49*  --   --   --  CALCIUM 9.0 8.9  --  8.6*  --  8.9  --   --  8.5*  --   --   --   PHOS  --   --   --   --   --  4.2  --   --   --   --   --   --    < > = values in this interval not displayed.   GFR: Estimated Creatinine Clearance: 36.4 mL/min (A) (by C-G formula based on SCr of 1.49 mg/dL (H)). Liver Function Tests: Recent Labs  Lab 06/29/20 0653  ALBUMIN 3.6   No results for input(s): LIPASE, AMYLASE in the last 168 hours. No results for input(s): AMMONIA in the last 168 hours. Coagulation Profile: No results for input(s): INR, PROTIME in the last 168 hours. Cardiac Enzymes: No results for input(s): CKTOTAL, CKMB, CKMBINDEX, TROPONINI in the last 168 hours. BNP (last 3 results) No results for input(s): PROBNP in the last 8760 hours. HbA1C: Recent Labs    06/28/20 0757  HGBA1C 7.6*   CBG: Recent Labs  Lab 06/29/20 0741 06/29/20 1118 06/29/20 1806 06/30/20 0817 06/30/20 1231  GLUCAP 216* 193* 128* 124* 219*   Lipid Profile: No results for input(s): CHOL, HDL, LDLCALC, TRIG, CHOLHDL, LDLDIRECT in the last 72 hours. Thyroid Function Tests: Recent Labs    06/28/20 0757  TSH 4.641*   Anemia Panel: No results for input(s): VITAMINB12, FOLATE, FERRITIN, TIBC, IRON, RETICCTPCT in the last 72 hours. Sepsis Labs: No results for input(s): PROCALCITON, LATICACIDVEN in the last 168 hours.  Recent Results (from the past 240 hour(s))  SARS Coronavirus 2 by RT PCR (hospital order, performed in [facility] hospital lab) Nasopharyngeal Nasopharyngeal Swab     Status: None   Collection Time: 06/27/20 11:41 PM   Specimen: Nasopharyngeal Swab  Result Value Ref  Range Status   SARS Coronavirus 2 NEGATIVE NEGATIVE Final    Comment: (NOTE) SARS-CoV-2 target nucleic acids are NOT DETECTED.  The SARS-CoV-2 RNA is generally detectable in upper and lower respiratory specimens during the acute phase of infection. The lowest concentration of SARS-CoV-2 viral copies this assay can detect is 250 copies / mL. A negative result does not preclude SARS-CoV-2 infection and should not be used as the sole basis for treatment or other patient management decisions.  A negative result may occur with improper specimen collection / handling, submission of specimen other than nasopharyngeal swab, presence of viral mutation(s) within the areas targeted by this assay, and inadequate number of viral copies (<250 copies / mL). A negative result must be combined with clinical observations, patient history, and epidemiological information.  Fact Sheet for Patients:   StrictlyIdeas.no  Fact Sheet for Healthcare Providers: BankingDealers.co.za  This test is not yet approved or  cleared by the [place] FDA and has been authorized for detection and/or diagnosis of SARS-CoV-2 by FDA under an Emergency Use Authorization (EUA).  This EUA will remain in effect (meaning this test can be used) for the duration of the COVID-19 declaration under Section 564(b)(1) of the Act, 21 U.S.C. section 360bbb-3(b)(1), unless the authorization is terminated or revoked sooner.  Performed at [facility], [place] [address], [place], [place] [address]          Radiology Studies: No results found.      Scheduled Meds: . apixaban  2.5 mg Oral BID  . Chlorhexidine Gluconate Cloth  6 each Topical Daily  . clopidogrel  75 mg Oral Daily  .  furosemide  80 mg Intravenous BID  . gabapentin  100 mg Oral BID  . insulin aspart  0-15 Units Subcutaneous TID WC  . sodium chloride flush  3 mL Intravenous Once  . tamsulosin  0.4 mg Oral  QPC supper   Continuous Infusions:   LOS: 2 days   Total time spent: 33 minutes  Darliss Cheney, MD [facility] P7/09/2020, 1:16 PM

## 2020-06-30 NOTE — Evaluation (Signed)
Physical Therapy Evaluation Patient Details Name: Henry Wiggins MRN: [id number] DOB: January 10, 1927 Today's Date: 06/30/2020   History of Present Illness  Pt is a 47 male presenting with abnormal sodium level and decerased apetite. PMH includes: CKD IIIb, HTN, PAD, DM II, combined systolic and diastolic HF, afib on Eliquis, dementia, R AKA and stroke.  Clinical Impression  Pt presented supine in bed with HOB elevated, awake and willing to participate in therapy session. Pt's daughter present throughout session and providing history information. Prior to admission, pt was progressively getting weaker over the last three weeks per daughter's report, and has therefore been mostly in bed. Pt's daughter or son assists with all ADLs and transfers/mobility. He has a w/c that he was previously able to propel himself in prior to becoming so weak. At the time of evaluation, pt required mod-max A x2 for bed mobility and able to sit upright at EOB with 1-2 UE supports and close min guard for safety. Pt unable to perform lateral scooting in bed at this time secondary to fatigue and weakness. Based on pt's current functional mobility status, difficulties with managing at home and reports of a few falls, would recommend pt d/c to SNF for further intensive therapy services to maximize his independence with functional mobility prior to returning home with family support. However, pt's daughter very adamant about wanting to take pt back home. Pt would continue to benefit from skilled physical therapy services at this time while admitted and after d/c to address the below listed limitations in order to improve overall safety and independence with functional mobility.  All VSS throughout.     Follow Up Recommendations SNF;Other (comment) (pt's daughter adamant about pt returning home with family)    [facility] bed;Other (comment) Optician, dispensing)    Recommendations for Other Services        Precautions / Restrictions Precautions Precautions: Fall Precaution Comments: recent R AKA Restrictions Weight Bearing Restrictions: No      Mobility  Bed Mobility Overal bed mobility: Needs Assistance Bed Mobility: Supine to Sit;Sit to Supine     Supine to sit: Mod assist;+2 for physical assistance Sit to supine: Max assist;+2 for physical assistance   General bed mobility comments: increased time and effort needed, max encouragement as well as multimodal cueing needed to complete tasks, bed pads utilized to position pt's hips at EOB  Transfers                 General transfer comment: unable  Ambulation/Gait                Stairs            Wheelchair Mobility    Modified Rankin (Stroke Patients Only)       Balance Overall balance assessment: Needs assistance;History of Falls Sitting-balance support: Feet supported;Single extremity supported;Bilateral upper extremity supported Sitting balance-Leahy Scale: Poor                                       Pertinent Vitals/Pain Pain Assessment: Faces Faces Pain Scale: No hurt    Home Living Family/patient expects to be discharged to:: Private residence Living Arrangements: Children Available Help at Discharge: Family;Available 24 hours/day Type of Home: House Home Access: Stairs to enter     Home Layout: Two level;Bed/bath upstairs Home Equipment: Kasandra Knudsen - single point;Walker - 4 wheels;Shower seat;Grab bars - tub/shower;Wheelchair - manual  Prior Function Level of Independence: Needs assistance   Gait / Transfers Assistance Needed: about 3 weeks ago pt was able to perform sit<>stand and stand-pivot transfers with assistance from a family member to get in and out of his bed/wheelchair/etc. Since he has been getting weaker over the past 3 weeks he has mostly stayed in bed  ADL's / Homemaking Assistance Needed: requires assistance from either daughter or son for ADLs         Hand Dominance        Extremity/Trunk Assessment   Upper Extremity Assessment Upper Extremity Assessment: Generalized weakness    Lower Extremity Assessment Lower Extremity Assessment: Generalized weakness;RLE deficits/detail RLE Deficits / Details: previous transfemoral amputation; incision scar was clean, dry and overall appeared to be very well healed    Cervical / Trunk Assessment Cervical / Trunk Assessment: Kyphotic  Communication   Communication: HOH  Cognition Arousal/Alertness: Lethargic Behavior During Therapy: Flat affect Overall Cognitive Status: Impaired/Different from baseline Area of Impairment: Attention;Memory;Following commands;Safety/judgement;Awareness;Problem solving                   Current Attention Level: Sustained Memory: Decreased short-term memory Following Commands: Follows one step commands inconsistently;Follows one step commands with increased time Safety/Judgement: Decreased awareness of deficits;Decreased awareness of safety Awareness: Intellectual Problem Solving: Slow processing;Decreased initiation;Difficulty sequencing;Requires verbal cues;Requires tactile cues        General Comments      Exercises     Assessment/Plan    PT Assessment Patient needs continued PT services  PT Problem List Decreased strength;Decreased activity tolerance;Decreased balance;Decreased mobility;Decreased coordination;Decreased cognition;Decreased knowledge of use of DME;Decreased safety awareness;Decreased knowledge of precautions       PT Treatment Interventions DME instruction;Functional mobility training;Therapeutic activities;Therapeutic exercise;Neuromuscular re-education;Balance training;Cognitive remediation;Patient/family education    PT Goals (Current goals can be found in the Care Plan section)  Acute Rehab PT Goals Patient Stated Goal: to get better PT Goal Formulation: With patient/family Time For Goal Achievement:  07/14/20 Potential to Achieve Goals: Fair    Frequency Min 3X/week   Barriers to discharge        Co-evaluation               AM-PAC PT "6 Clicks" Mobility  Outcome Measure Help needed turning from your back to your side while in a flat bed without using bedrails?: A Lot Help needed moving from lying on your back to sitting on the side of a flat bed without using bedrails?: A Lot Help needed moving to and from a bed to a chair (including a wheelchair)?: Total Help needed standing up from a chair using your arms (e.g., wheelchair or bedside chair)?: Total Help needed to walk in hospital room?: Total Help needed climbing 3-5 steps with a railing? : Total 6 Click Score: 8    End of Session   Activity Tolerance: Patient limited by fatigue;Patient limited by lethargy Patient left: in bed;with call bell/phone within reach;with bed alarm set Nurse Communication: Mobility status PT Visit Diagnosis: Other abnormalities of gait and mobility (R26.89);Muscle weakness (generalized) (M62.81)    Time: 3875-6433 PT Time Calculation (min) (ACUTE ONLY): 21 min   Charges:   PT Evaluation $PT Eval Moderate Complexity: 1 Mod          Eduard Clos, PT, DPT  Acute Rehabilitation Services Pager [phone] Office [place] 06/30/2020, 3:02 PM

## 2020-07-01 ENCOUNTER — Encounter: Payer: Self-pay | Admitting: Nurse Practitioner

## 2020-07-01 DIAGNOSIS — E876 Hypokalemia: Secondary | ICD-10-CM | POA: Diagnosis present

## 2020-07-01 LAB — CBC
HCT: 32.7 % — ABNORMAL LOW (ref 39.0–52.0)
Hemoglobin: 10.7 g/dL — ABNORMAL LOW (ref 13.0–17.0)
MCH: 29.3 pg (ref 26.0–34.0)
MCHC: 32.7 g/dL (ref 30.0–36.0)
MCV: 89.6 fL (ref 80.0–100.0)
Platelets: 243 10*3/uL (ref 150–400)
RBC: 3.65 MIL/uL — ABNORMAL LOW (ref 4.22–5.81)
RDW: 14.6 % (ref 11.5–15.5)
WBC: 7.7 10*3/uL (ref 4.0–10.5)
nRBC: 0 % (ref 0.0–0.2)

## 2020-07-01 LAB — RENAL FUNCTION PANEL
Albumin: 2.6 g/dL — ABNORMAL LOW (ref 3.5–5.0)
Anion gap: 10 (ref 5–15)
BUN: 29 mg/dL — ABNORMAL HIGH (ref 8–23)
CO2: 31 mmol/L (ref 22–32)
Calcium: 8.3 mg/dL — ABNORMAL LOW (ref 8.9–10.3)
Chloride: 89 mmol/L — ABNORMAL LOW (ref 98–111)
Creatinine, Ser: 1.37 mg/dL — ABNORMAL HIGH (ref 0.61–1.24)
GFR calc Af Amer: 52 mL/min — ABNORMAL LOW (ref >60)
GFR calc non Af Amer: 45 mL/min — ABNORMAL LOW (ref >60)
Glucose, Bld: 143 mg/dL — ABNORMAL HIGH (ref 70–99)
Phosphorus: 3.1 mg/dL (ref 2.5–4.6)
Potassium: 3.4 mmol/L — ABNORMAL LOW (ref 3.5–5.1)
Sodium: 130 mmol/L — ABNORMAL LOW (ref 135–145)

## 2020-07-01 LAB — GLUCOSE, CAPILLARY
Glucose-Capillary: 144 mg/dL — ABNORMAL HIGH (ref 70–99)
Glucose-Capillary: 190 mg/dL — ABNORMAL HIGH (ref 70–99)

## 2020-07-01 LAB — SODIUM: Sodium: 129 mmol/L — ABNORMAL LOW (ref 135–145)

## 2020-07-01 MED ORDER — LIDOCAINE 0.5 % EX GEL: 1.0000 | Freq: Two times a day (BID) | CUTANEOUS | 0 refills | Status: AC

## 2020-07-01 MED ORDER — POTASSIUM CHLORIDE CRYS ER 20 MEQ PO TBCR
40.0000 meq | EXTENDED_RELEASE_TABLET | Freq: Once | ORAL | Status: AC
  Administered 2020-07-01: 40 meq via ORAL
  Filled 2020-07-01: qty 2

## 2020-07-01 MED ORDER — FUROSEMIDE 40 MG PO TABS: 80.0000 mg | ORAL_TABLET | Freq: Every day | ORAL | 3 refills | Status: AC

## 2020-07-01 NOTE — Progress Notes (Signed)
Patient ID: Henry Wiggins, male   DOB: 1927-09-22, 86 y.o.   MRN: [id number] S: No events overnight O:BP 129/75 (BP Location: Left Arm)   Pulse 96   Temp (!) 95.6 F (35.3 C) (Axillary)   Resp 17   Ht 5\' 9"  (1.753 m)   Wt 87.5 kg   SpO2 95%   BMI 28.49 kg/m   Intake/Output Summary (Last 24 hours) at 07/01/2020 1124 Last data filed at 07/01/2020 0110 Gross per 24 hour  Intake 250 ml  Output 1000 ml  Net -750 ml   Intake/Output: I/O last 3 completed shifts: In: 750 [P.O.:750] Out: 2671 [Urine:3750]  Intake/Output this shift:  No intake/output data recorded. Weight change: -1.8 kg Gen: NAD CVS: no rub Resp: scattered rhonchi  Abd: benign Ext: trace left lower ext edema, s/p RAKA  Recent Labs  Lab 06/27/20 0937 06/27/20 1949 06/27/20 1949 06/28/20 0757 06/28/20 1506 06/29/20 0653 06/29/20 1149 06/29/20 1506 06/30/20 0050 06/30/20 0654 06/30/20 0846 06/30/20 1306 06/30/20 1603 06/30/20 2112 07/01/20 0037 07/01/20 0746  NA 118* 117*   < > 118*   < > 120*  120*   < > 123*   < > 123* 124* 125* 126* 126* 129* 130*  K 5.1 4.5  --  4.7  --  4.8  --  4.5  --   --   --   --   --   --   --  3.4*  CL 79* 82*  --  84*  --  83*  --  86*  --   --   --   --   --   --   --  89*  CO2 21 24  --  22  --  24  --  27  --   --   --   --   --   --   --  31  GLUCOSE 204* 215*  --  199*  --  199*  --  153*  --   --   --   --   --   --   --  143*  BUN 24 27*  --  26*  --  29*  --  30*  --   --   --   --   --   --   --  29*  CREATININE 1.43* 1.39*  --  1.46*  --  1.53*  --  1.49*  --   --   --   --   --   --   --  1.37*  ALBUMIN  --   --   --   --   --  3.6  --   --   --   --   --   --   --   --   --  2.6*  CALCIUM 9.0 8.9  --  8.6*  --  8.9  --  8.5*  --   --   --   --   --   --   --  8.3*  PHOS  --   --   --   --   --  4.2  --   --   --   --   --   --   --   --   --  3.1   < > = values in this interval not displayed.   Liver Function Tests: Recent Labs  Lab 06/29/20 0653  07/01/20 0746  ALBUMIN 3.6 2.6*  No results for input(s): LIPASE, AMYLASE in the last 168 hours. No results for input(s): AMMONIA in the last 168 hours. CBC: Recent Labs  Lab 06/27/20 0937 06/27/20 1949 06/27/20 1949 06/28/20 0757 06/29/20 0653 07/01/20 0746  WBC 10.0 11.0*   < > 12.0* 11.3* 7.7  HGB 11.6* 11.3*   < > 10.9* 11.3* 10.7*  HCT 33.5* 33.8*   < > 32.8* 35.4* 32.7*  MCV 85 85.4  --  86.5 88.9 89.6  PLT 234 254   < > 260 291 243   < > = values in this interval not displayed.   Cardiac Enzymes: No results for input(s): CKTOTAL, CKMB, CKMBINDEX, TROPONINI in the last 168 hours. CBG: Recent Labs  Lab 06/29/20 1806 06/30/20 0817 06/30/20 1231 06/30/20 2010 07/01/20 0752  GLUCAP 128* 124* 219* 201* 144*    Iron Studies: No results for input(s): IRON, TIBC, TRANSFERRIN, FERRITIN in the last 72 hours. Studies/Results: No results found. Marland Kitchen apixaban  2.5 mg Oral BID  . Chlorhexidine Gluconate Cloth  6 each Topical Daily  . clopidogrel  75 mg Oral Daily  . furosemide  80 mg Intravenous BID  . gabapentin  100 mg Oral BID  . insulin aspart  0-15 Units Subcutaneous TID WC  . sodium chloride flush  3 mL Intravenous Once  . tamsulosin  0.4 mg Oral QPC supper    BMET    Component Value Date/Time   NA 130 (L) 07/01/2020 0746   NA 118 (LL) 06/27/2020 0937   K 3.4 (L) 07/01/2020 0746   CL 89 (L) 07/01/2020 0746   CO2 31 07/01/2020 0746   GLUCOSE 143 (H) 07/01/2020 0746   BUN 29 (H) 07/01/2020 0746   BUN 24 06/27/2020 0937   CREATININE 1.37 (H) 07/01/2020 0746   CALCIUM 8.3 (L) 07/01/2020 0746   GFRNONAA 45 (L) 07/01/2020 0746   GFRAA 52 (L) 07/01/2020 0746   CBC    Component Value Date/Time   WBC 7.7 07/01/2020 0746   RBC 3.65 (L) 07/01/2020 0746   HGB 10.7 (L) 07/01/2020 0746   HGB 11.6 (L) 06/27/2020 0937   HCT 32.7 (L) 07/01/2020 0746   HCT 33.5 (L) 06/27/2020 0937   PLT 243 07/01/2020 0746   PLT 234 06/27/2020 0937   MCV 89.6 07/01/2020 0746    MCV 85 06/27/2020 0937   MCH 29.3 07/01/2020 0746   MCHC 32.7 07/01/2020 0746   RDW 14.6 07/01/2020 0746   RDW 14.0 06/27/2020 0937   LYMPHSABS 1.7 05/26/2015 2244   MONOABS 0.8 05/26/2015 2244   EOSABS 0.3 05/26/2015 2244   BASOSABS 0.0 05/26/2015 2244    Assessment/Plan: 1. Hyponatremia- in setting of acute on chronic combined diastolic and systolic CHF. Pt with marked volume overload on exam, BNP >2500. Urine Na <10, Uosm 494, Sosm 261. 1. StartedIV lasix 80 mg bid with some improvement of serum sodium, UOP and edema. 2. Continued improvement in sodium from 117 to 120 to 124 to 130 today. 3. Goodman for discharge per primary svc.  Recommend increasing home lasix dose to 80 mg daily and f/u closely with his PCP and cardiology after discharge. 2. Acute on chronic combined systolic and diastolic CHF- improved with diuresis.  No need for nephrology follow up unless he develops AKI again. 3. CKD stage IIIa- presumably due to combination of HTN, DM, and cardiorenal syndrome (UNa <10 and acute on chronic CHF). Goal BP <130/80, goal Hgb A1c <7%, hold K supplements for now due to acute on chronic  CHF and AKI. Avoid nephrotoxic agents.Back to baseline. 4. Disposition- for discharge to home per primary svc.  Donetta Potts, MD [employer] [phone]

## 2020-07-01 NOTE — Discharge Summary (Addendum)
Physician Discharge Summary  Henry Wiggins RVU:[id number] DOB: 12-May-1927 DOA: 06/27/2020  PCP: Antony Contras, MD  Admit date: 06/27/2020 Discharge date: 07/01/2020  Admitted From: Home Disposition: Home  Recommendations for Outpatient Follow-up:  1. Follow up with PCP in 1-2 weeks 2. Please obtain BMP/CBC in one week 3. Please follow up with your PCP on the following pending results: Unresulted Labs (From admission, onward) Comment         None       Home Health: Yes Equipment/Devices: None  Discharge Condition: Stable CODE STATUS: DNR Diet recommendation: Low-sodium diet with 1500 cc fluid restriction  Subjective: Seen and examined.  No complaints.  Completely alert but partially oriented, likely at his baseline due to dementia.  Brief/Interim Summary: Patient is a 86 year old male with history of hypertension, peripheral artery disease, chronic combined systolic/diastolic CHF, type 2 diabetes mellitus, CKD stage III, CVA, paroxysmal A. fib who presents with abnormal sodium level.  He was seen by his cardiologist and sent for further evaluation to the emergency department.  Patient also reported swelling of the left leg for last few weeks.  He recently had increased dose of Lasix.  He was seen by urology a week ago for urinary retention and had a Foley placed.  There is also suspicion for mild dementia and patient is waiting for referral to neurology.   He is status post AKA in June of this year secondary to gangrenous complication from peripheral artery disease. On presentation he was found to have low sodium level of 117.  He appeared hypervolemic on presentation.  He was admitted under hospitalist service and nephrology was consulted.  His lab work was against SIADH.  He was diagnosed to have hypervolemic hyponatremia and was started on 80 mg IV Lasix per nephrology.  He had significant diuresis and his sodium improved to 130 today.  Patient also had 1 day of hospital-acquired  delirium which has resolved and patient is now alert and partially oriented at his baseline.  He also had hypokalemia which was replaced.  Patient is doing much better.  He has been cleared by nephrology to discharge on double dose of Lasix which will be 80 mg p.o. daily and 1500 cc of fluid restriction.  He was seen by PT OT who strongly recommended discharging to SNF however patient was also seen by palliative care and family also had a long discussion with palliative care and after that meeting, family decided to take him home with outpatient hospice instead of SNF.  He is being discharged in stable condition.  Follow with PCP.  His potassium is still low today for which she already received potassium replacement and he will continue his potassium replacement at home.  Discharge Diagnoses:  Principal Problem:   Hyponatremia Active Problems:   Hypertension   PAD (peripheral artery disease) (HCC)   S/P AKA (above knee amputation) (HCC)   Chronic combined systolic and diastolic heart failure (HCC)   Type 2 diabetes mellitus with hyperglycemia ([place])   CKD stage 3 due to type 2 diabetes mellitus ([place])   Hypokalemia    Discharge Instructions   Allergies as of 07/01/2020   No Known Allergies     Medication List    TAKE these medications   apixaban 2.5 MG Tabs tablet Commonly known as: ELIQUIS Take 1 tablet (2.5 mg total) by mouth 2 (two) times daily.   blood glucose meter kit and supplies Dispense based on patient and insurance preference. Use up to four times daily  as directed. (FOR ICD-10 E10.9, E11.9).   clopidogrel 75 MG tablet Commonly known as: Plavix Take 1 tablet (75 mg total) by mouth daily.   escitalopram 5 MG tablet Commonly known as: LEXAPRO Take 5 mg by mouth daily.   furosemide 40 MG tablet Commonly known as: LASIX Take 2 tablets (80 mg total) by mouth daily. 80MG x3 DAYS THEN BACK TO 40MG What changed: how much to take   gabapentin 100 MG capsule Commonly  known as: NEURONTIN Take 100 mg by mouth 2 (two) times daily.   linagliptin 5 MG Tabs tablet Commonly known as: Tradjenta Take 1 tablet (5 mg total) by mouth daily.   OneTouch Verio test strip Generic drug: glucose blood FOR USE WHEN CHECKING BLOOD GLUCOSE ONCE DAILY, ALTERNATING AM & PM, BEFORE MEALS E11.69   potassium chloride SA 20 MEQ tablet Commonly known as: Klor-Con M20 Take 1 tablet (20 mEq total) by mouth daily. 40MEQ x3 DAYS THEN STOP What changed:   how much to take  when to take this  additional instructions   PreserVision AREDS 2 Caps Take 1 tablet by mouth daily.   silodosin 4 MG Caps capsule Commonly known as: RAPAFLO Take 4 mg by mouth daily.   Vitamin D 50 MCG (2000 UT) tablet Take 2,000 Units by mouth daily.       Follow-up Information    Antony Contras, MD Follow up in 1 week(s).   Specialty: Family Medicine Contact information: [place] [address] [phone]        Sanda Klein, MD .   Specialty: Cardiology Contact information: [address] [place]  [address] [phone]              No Known Allergies  Consultations: Nephrology and palliative care   Procedures/Studies: DG Chest 1 View  Result Date: 06/27/2020 CLINICAL DATA:  Shortness of breath with left lower leg edema x2 weeks. EXAM: CHEST  1 VIEW COMPARISON:  December 31, 2019 FINDINGS: Moderate severity areas of atelectasis and/or infiltrate are seen within the bilateral lung bases, right greater than left. Small bilateral pleural effusions are also seen, right greater than left. No pneumothorax is identified. The cardiac silhouette is markedly enlarged. There is mild calcification of the aortic arch. Multilevel degenerative changes seen throughout the thoracic spine. IMPRESSION: 1. Moderate severity bibasilar atelectasis and/or infiltrate, right greater than left. 2. Small bilateral pleural effusions, right greater than  left. 3. Cardiomegaly. Electronically Signed   By: Virgina Norfolk M.D.   On: 06/27/2020 20:34      Discharge Exam: Vitals:   07/01/20 0449 07/01/20 0753  BP: (!) 126/58 129/75  Pulse: 75 96  Resp: 19 17  Temp: 98.7 F (37.1 C) (!) 95.6 F (35.3 C)  SpO2: 100% 95%   Vitals:   07/01/20 0000 07/01/20 0400 07/01/20 0449 07/01/20 0753  BP: 131/74  (!) 126/58 129/75  Pulse: 77  75 96  Resp: _0 Temp: 98.6 F (37 C)  98.7 F (37.1 C) (!) 95.6 F (35.3 C)  TempSrc: Oral  Oral Axillary  SpO2: 100%  100% 95%  Weight:  87.5 kg    Height:        General: Pt is alert, awake, not in acute distress Cardiovascular: RRR, S1/S2 +, no rubs, no gallops Respiratory: CTA bilaterally, no wheezing, no rhonchi Abdominal: Soft, NT, ND, bowel sounds + Extremities: no edema, no cyanosis    The results of significant diagnostics from  this hospitalization (including imaging, microbiology, ancillary and laboratory) are listed below for reference.     Microbiology: Recent Results (from the past 240 hour(s))  SARS Coronavirus 2 by RT PCR (hospital order, performed in [facility] hospital lab) Nasopharyngeal Nasopharyngeal Swab     Status: None   Collection Time: 06/27/20 11:41 PM   Specimen: Nasopharyngeal Swab  Result Value Ref Range Status   SARS Coronavirus 2 NEGATIVE NEGATIVE Final    Comment: (NOTE) SARS-CoV-2 target nucleic acids are NOT DETECTED.  The SARS-CoV-2 RNA is generally detectable in upper and lower respiratory specimens during the acute phase of infection. The lowest concentration of SARS-CoV-2 viral copies this assay can detect is 250 copies / mL. A negative result does not preclude SARS-CoV-2 infection and should not be used as the sole basis for treatment or other patient management decisions.  A negative result may occur with improper specimen collection / handling, submission of specimen other than nasopharyngeal swab, presence of viral mutation(s) within  the areas targeted by this assay, and inadequate number of viral copies (<250 copies / mL). A negative result must be combined with clinical observations, patient history, and epidemiological information.  Fact Sheet for Patients:   StrictlyIdeas.no  Fact Sheet for Healthcare Providers: BankingDealers.co.za  This test is not yet approved or  cleared by the [place] FDA and has been authorized for detection and/or diagnosis of SARS-CoV-2 by FDA under an Emergency Use Authorization (EUA).  This EUA will remain in effect (meaning this test can be used) for the duration of the COVID-19 declaration under Section 564(b)(1) of the Act, 21 U.S.C. section 360bbb-3(b)(1), unless the authorization is terminated or revoked sooner.  Performed at [facility], [place] [address], [place], [place] [address]      Labs: BNP (last 3 results) Recent Labs    12/31/19 0139 06/28/20 0757  BNP 1,620.0* 0,254.2*   Basic Metabolic Panel: Recent Labs  Lab 06/27/20 1949 06/27/20 1949 06/28/20 0757 06/28/20 1506 06/29/20 0653 06/29/20 1149 06/29/20 1506 06/30/20 0050 06/30/20 1306 06/30/20 1603 06/30/20 2112 07/01/20 0037 07/01/20 0746  NA 117*   < > 118*   < > 120*  120*   < > 123*   < > 125* 126* 126* 129* 130*  K 4.5  --  4.7  --  4.8  --  4.5  --   --   --   --   --  3.4*  CL 82*  --  84*  --  83*  --  86*  --   --   --   --   --  89*  CO2 24  --  22  --  24  --  27  --   --   --   --   --  31  GLUCOSE 215*  --  199*  --  199*  --  153*  --   --   --   --   --  143*  BUN 27*  --  26*  --  29*  --  30*  --   --   --   --   --  29*  CREATININE 1.39*  --  1.46*  --  1.53*  --  1.49*  --   --   --   --   --  1.37*  CALCIUM 8.9  --  8.6*  --  8.9  --  8.5*  --   --   --   --   --  8.3*  PHOS  --   --   --   --  4.2  --   --   --   --   --   --   --  3.1   < > = values in this interval not displayed.   Liver Function Tests: Recent  Labs  Lab 06/29/20 0653 07/01/20 0746  ALBUMIN 3.6 2.6*   No results for input(s): LIPASE, AMYLASE in the last 168 hours. No results for input(s): AMMONIA in the last 168 hours. CBC: Recent Labs  Lab 06/27/20 0937 06/27/20 1949 06/28/20 0757 06/29/20 0653 07/01/20 0746  WBC 10.0 11.0* 12.0* 11.3* 7.7  HGB 11.6* 11.3* 10.9* 11.3* 10.7*  HCT 33.5* 33.8* 32.8* 35.4* 32.7*  MCV 85 85.4 86.5 88.9 89.6  PLT 234 254 260 291 243   Cardiac Enzymes: No results for input(s): CKTOTAL, CKMB, CKMBINDEX, TROPONINI in the last 168 hours. BNP: Invalid input(s): POCBNP CBG: Recent Labs  Lab 06/29/20 1806 06/30/20 0817 06/30/20 1231 06/30/20 2010 07/01/20 0752  GLUCAP 128* 124* 219* 201* 144*   D-Dimer No results for input(s): DDIMER in the last 72 hours. Hgb A1c No results for input(s): HGBA1C in the last 72 hours. Lipid Profile No results for input(s): CHOL, HDL, LDLCALC, TRIG, CHOLHDL, LDLDIRECT in the last 72 hours. Thyroid function studies No results for input(s): TSH, T4TOTAL, T3FREE, THYROIDAB in the last 72 hours.  Invalid input(s): FREET3 Anemia work up No results for input(s): VITAMINB12, FOLATE, FERRITIN, TIBC, IRON, RETICCTPCT in the last 72 hours. Urinalysis    Component Value Date/Time   COLORURINE STRAW (A) 12/31/2019 1746   APPEARANCEUR CLEAR 12/31/2019 1746   LABSPEC 1.009 12/31/2019 1746   PHURINE 5.0 12/31/2019 1746   GLUCOSEU NEGATIVE 12/31/2019 1746   HGBUR NEGATIVE 12/31/2019 1746   BILIRUBINUR NEGATIVE 12/31/2019 1746   KETONESUR NEGATIVE 12/31/2019 1746   PROTEINUR NEGATIVE 12/31/2019 1746   UROBILINOGEN 0.2 05/27/2015 0223   NITRITE NEGATIVE 12/31/2019 1746   LEUKOCYTESUR NEGATIVE 12/31/2019 1746   Sepsis Labs Invalid input(s): PROCALCITONIN,  WBC,  LACTICIDVEN Microbiology Recent Results (from the past 240 hour(s))  SARS Coronavirus 2 by RT PCR (hospital order, performed in [place] hospital lab) Nasopharyngeal Nasopharyngeal Swab      Status: None   Collection Time: 06/27/20 11:41 PM   Specimen: Nasopharyngeal Swab  Result Value Ref Range Status   SARS Coronavirus 2 NEGATIVE NEGATIVE Final    Comment: (NOTE) SARS-CoV-2 target nucleic acids are NOT DETECTED.  The SARS-CoV-2 RNA is generally detectable in upper and lower respiratory specimens during the acute phase of infection. The lowest concentration of SARS-CoV-2 viral copies this assay can detect is 250 copies / mL. A negative result does not preclude SARS-CoV-2 infection and should not be used as the sole basis for treatment or other patient management decisions.  A negative result may occur with improper specimen collection / handling, submission of specimen other than nasopharyngeal swab, presence of viral mutation(s) within the areas targeted by this assay, and inadequate number of viral copies (<250 copies / mL). A negative result must be combined with clinical observations, patient history, and epidemiological information.  Fact Sheet for Patients:   StrictlyIdeas.no  Fact Sheet for Healthcare Providers: BankingDealers.co.za  This test is not yet approved or  cleared by the [place] FDA and has been authorized for detection and/or diagnosis of SARS-CoV-2 by FDA under an Emergency Use Authorization (EUA).  This EUA will remain in effect (meaning this test can be used) for  the duration of the COVID-19 declaration under Section 564(b)(1) of the Act, 21 U.S.C. section 360bbb-3(b)(1), unless the authorization is terminated or revoked sooner.  Performed at [facility], [place] [address], [place],  [address]      Time coordinating discharge: Over 30 minutes  SIGNED:   Darliss Cheney, MD  [facility] 07/01/2020, 11:07 AM  If 7PM-7AM, please contact night-coverage [email or web address]   Addendum: I had a very long discussion with the daughter Henry Wiggins over the phone prior to placing  discharge orders and summary.  I already answered several questions asked by her.  She reiterated that they have decided to take patient home instead of sending to SNF.  They have also decided on outpatient hospice which has been arranged for them.  Months discharge orders and summary were done, I received a message from the nurse that patient's son who is at the bedside now has several concerns about patient going home and has several questions.  The daughter also had some questions.  I went at the bedside.  The son was present but daughter was not.  Patient came in with Foley catheter.  Apparently, this was supposed to come out on Friday at urology's office and they wanted Foley catheter to come out today.  I explained to them that we will need several hours for voiding trial for this patient and this may end up being the reason for patient's extended hospitalization and stay overnight since it is already noontime.  I recommended patient to follow-up with urology tomorrow or day after for removal of Foley catheter and voiding trial.  They were concerned about penile ulcers.  I looked at the ulcers.  There was only 1 shallow ulcer which was nonbleeding.  With patient's age, STD is of course not in differential diagnosis.  Patient was not complaining of any pain and this was nontender nonbleeding.  For families peace of mind, I have prescribed them lidocaine gel to apply as needed.  His ulcer is likely secondary to rubbing of the Foley catheter tube.  They were also concerned about his sodium being 130 and at 135.  I did explain to them that his sodium is currently at his baseline and the patient does not have to stay in the hospital until sodium completely normalizes now that he is completely alert.  After long discussion, I explained to them that patient does not have any medical necessity to stay in the hospital any longer as all of his diagnoses/problems have been resolved that he came in for.  The son at the  end verbalized understanding and appreciated a second visit.  He also spoke to his daughter over the phone while I was in the room and she was in agreement with discharge as well.

## 2020-07-01 NOTE — TOC Transition Note (Signed)
Transition of Care [facility]) - CM/SW Discharge Note   Patient Details  Name: CASEN PRYOR MRN: [id number] Date of Birth: [birth year]  Transition of Care [facility] CM/SW Contact:  Carles Collet, RN Phone Number: 07/01/2020, 12:09 PM   Clinical Narrative:   Damaris Schooner w patient's daughter. She would like to continue [facility] services w [facility] and add on palliative. Referral made to both with her permission. She states they have all DME needed at home. Patient's son will provide transportation home.      Final next level of care: [place] Barriers to Discharge: No Barriers Identified   Patient Goals and CMS Choice Patient states their goals for this hospitalization and ongoing recovery are:: spoke w daughter, plan to go home w [facility] and palliative CMS Medicare.gov Compare Post Acute Care list provided to:: Other (Comment Required) Choice offered to / list presented to : Adult Children  Discharge Placement                       Discharge Plan and Services   Discharge Planning Services: CM Consult            DME Arranged: Patient refused services         HH Arranged: PT, RN [facility] Agency: [place] ([place]) Date [place]: 07/01/20 Time [place]: 1209 Representative spoke with at [place]: [place] ([place]) Interventions     Readmission Risk Interventions Readmission Risk Prevention Plan 01/03/2020  Post Dischage Appt Complete  Medication Screening Complete  Transportation Screening Complete  Some recent data might be hidden

## 2020-07-01 NOTE — Progress Notes (Signed)
   07/01/20 1257  AVS Discharge Documentation  AVS Discharge Instructions Including Medications Provided to patient/caregiver;Placed in discharge packet for receiving facility  Name of Person Receiving AVS Discharge Instructions Including Medications Henry Wiggins  Name of Clinician That Reviewed AVS Discharge Instructions Including Medications Venida Jarvis, RN

## 2020-07-01 NOTE — Progress Notes (Signed)
[facility] ([facility])  Hospital Liaison: RN note         Notified by TOC manager of patient/family request for [facility] Palliative services at home after discharge.                  [facility] Palliative team will follow up with patient after discharge.         Please call with any hospice or palliative related questions.         Thank you for this referral.         Mary Anne Robertson, RN, CCM  [facility] Hospital Liaison (listed on AMION under Hospice/[facility])    [phone]   

## 2020-07-02 LAB — GLUCOSE, CAPILLARY: Glucose-Capillary: 177 mg/dL — ABNORMAL HIGH (ref 70–99)

## 2020-07-02 NOTE — Progress Notes (Signed)
Receive message that patient/ family wanted Home Hospice care. TCT daughter [name] to discuss DCP. Arlene stated that she wants her father to have home hospice services as discussed by the Nurse Practitioner for Palliative Care services; Home Hospice choice offered, she chose [facility]. Referral placed as requested - Farrel Gordon RN with [facility] is aware of the patient and will follow up with home hospice. Butch Penny with [facility] made aware of change in DCP. [place] [phone]

## 2020-07-03 DIAGNOSIS — R5381 Other malaise: Secondary | ICD-10-CM | POA: Diagnosis not present

## 2020-07-03 DIAGNOSIS — G4700 Insomnia, unspecified: Secondary | ICD-10-CM | POA: Diagnosis not present

## 2020-07-03 DIAGNOSIS — Z9181 History of falling: Secondary | ICD-10-CM | POA: Diagnosis not present

## 2020-07-03 DIAGNOSIS — I5042 Chronic combined systolic (congestive) and diastolic (congestive) heart failure: Secondary | ICD-10-CM | POA: Diagnosis not present

## 2020-07-03 DIAGNOSIS — R338 Other retention of urine: Secondary | ICD-10-CM | POA: Diagnosis not present

## 2020-07-03 DIAGNOSIS — E871 Hypo-osmolality and hyponatremia: Secondary | ICD-10-CM | POA: Diagnosis not present

## 2020-07-03 DIAGNOSIS — R339 Retention of urine, unspecified: Secondary | ICD-10-CM | POA: Diagnosis not present

## 2020-07-03 DIAGNOSIS — R69 Illness, unspecified: Secondary | ICD-10-CM | POA: Diagnosis not present

## 2020-07-03 DIAGNOSIS — Z89611 Acquired absence of right leg above knee: Secondary | ICD-10-CM | POA: Diagnosis not present

## 2020-07-03 DIAGNOSIS — I739 Peripheral vascular disease, unspecified: Secondary | ICD-10-CM | POA: Diagnosis not present

## 2020-07-11 ENCOUNTER — Ambulatory Visit: Payer: Medicare HMO | Admitting: Adult Health

## 2020-07-11 DIAGNOSIS — S78111A Complete traumatic amputation at level between right hip and knee, initial encounter: Secondary | ICD-10-CM | POA: Diagnosis not present

## 2020-07-22 DEATH — deceased

## 2021-08-29 IMAGING — NM NM PULMONARY PERF PARTICULATE
8 series · 8 of 8 positions shown · non-contrast
Comparison: Chest radiograph December 31, 2019

CLINICAL DATA: Decreased oxygen saturation. Recent lower extremity
amputation

EXAM:
NUCLEAR MEDICINE PERFUSION LUNG SCAN
TECHNIQUE: Perfusion images were obtained in multiple projections after
intravenous injection of radiopharmaceutical.
Ventilation scans intentionally deferred if perfusion scan and chest
x-ray adequate for interpretation during COVID 19 epidemic.
Views: Anterior, posterior, left lateral, right lateral, RPO, LPO,
RAO, LAO
RADIOPHARMACEUTICALS:  1.59 mCi 1c-WWm MAA IV

[Series 1: ant/post perf · 4.14mm/px · 1 of 1 slices shown (1 of 2)]
[im 1/1]
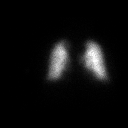

[Series 1: ant/post perf · 4.14mm/px · 1 of 1 slices shown (2 of 2)]
[im 1/1]
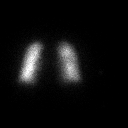

[Series 2: lao/rpo perf · 4.14mm/px · 1 of 1 slices shown (1 of 2)]
[im 1/1]
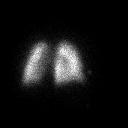

[Series 2: lao/rpo perf · 4.14mm/px · 1 of 1 slices shown (2 of 2)]
[im 1/1]
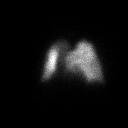

[Series 3: lpo/rao perf · 4.14mm/px · 1 of 1 slices shown (1 of 2)]
[im 1/1]
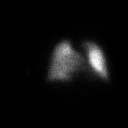

[Series 3: lpo/rao perf · 4.14mm/px · 1 of 1 slices shown (2 of 2)]
[im 1/1]
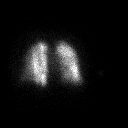

[Series 4: lt lat/rt lat perf · 4.14mm/px · 1 of 1 slices shown (1 of 2)]
[im 1/1]
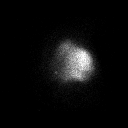

[Series 4: lt lat/rt lat perf · 4.14mm/px · 1 of 1 slices shown (2 of 2)]
[im 1/1]
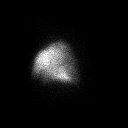

[8 of 8 positions shown; findings below may reference images not displayed]

FINDINGS: Radiotracer uptake is homogeneous and symmetric bilaterally. No
appreciable perfusion defects.
IMPRESSION: No appreciable perfusion defects. Very low probability of pulmonary
embolus.
